# Patient Record
Sex: Male | Born: 1965 | Race: White | Hispanic: No | Marital: Married | Smoking: Never smoker
Health system: Southern US, Community
[De-identification: ages and names within clinical notes are randomized; demographics above are authoritative.]

## PROBLEM LIST (undated history)

## (undated) ENCOUNTER — Emergency Department (HOSPITAL_COMMUNITY): Admission: EM | Source: Home / Self Care

## (undated) DIAGNOSIS — G4730 Sleep apnea, unspecified: Secondary | ICD-10-CM

## (undated) DIAGNOSIS — E785 Hyperlipidemia, unspecified: Secondary | ICD-10-CM

## (undated) DIAGNOSIS — D126 Benign neoplasm of colon, unspecified: Secondary | ICD-10-CM

## (undated) DIAGNOSIS — E669 Obesity, unspecified: Secondary | ICD-10-CM

## (undated) DIAGNOSIS — M5136 Other intervertebral disc degeneration, lumbar region: Secondary | ICD-10-CM

## (undated) DIAGNOSIS — M5432 Sciatica, left side: Secondary | ICD-10-CM

## (undated) DIAGNOSIS — R7303 Prediabetes: Secondary | ICD-10-CM

## (undated) DIAGNOSIS — E119 Type 2 diabetes mellitus without complications: Secondary | ICD-10-CM

## (undated) DIAGNOSIS — Z8719 Personal history of other diseases of the digestive system: Secondary | ICD-10-CM

## (undated) DIAGNOSIS — Z87442 Personal history of urinary calculi: Secondary | ICD-10-CM

## (undated) DIAGNOSIS — M51369 Other intervertebral disc degeneration, lumbar region without mention of lumbar back pain or lower extremity pain: Secondary | ICD-10-CM

## (undated) DIAGNOSIS — K219 Gastro-esophageal reflux disease without esophagitis: Secondary | ICD-10-CM

## (undated) DIAGNOSIS — I10 Essential (primary) hypertension: Secondary | ICD-10-CM

## (undated) DIAGNOSIS — D649 Anemia, unspecified: Secondary | ICD-10-CM

## (undated) HISTORY — DX: Gastro-esophageal reflux disease without esophagitis: K21.9

## (undated) HISTORY — DX: Sleep apnea, unspecified: G47.30

## (undated) HISTORY — DX: Hyperlipidemia, unspecified: E78.5

## (undated) HISTORY — DX: Obesity, unspecified: E66.9

## (undated) HISTORY — DX: Sciatica, left side: M54.32

## (undated) HISTORY — PX: VASECTOMY: SHX75

## (undated) HISTORY — DX: Other intervertebral disc degeneration, lumbar region: M51.36

## (undated) HISTORY — PX: EYE SURGERY: SHX253

## (undated) HISTORY — PX: COLONOSCOPY: SHX174

## (undated) HISTORY — DX: Other intervertebral disc degeneration, lumbar region without mention of lumbar back pain or lower extremity pain: M51.369

## (undated) HISTORY — DX: Prediabetes: R73.03

## (undated) HISTORY — DX: Benign neoplasm of colon, unspecified: D12.6

## (undated) HISTORY — PX: TONSILLECTOMY: SUR1361

## (undated) HISTORY — PX: ESOPHAGOGASTRODUODENOSCOPY: SHX1529

---

## 2012-04-21 ENCOUNTER — Ambulatory Visit (INDEPENDENT_AMBULATORY_CARE_PROVIDER_SITE_OTHER): Admitting: Family Medicine

## 2012-04-21 ENCOUNTER — Encounter: Payer: Self-pay | Admitting: Family Medicine

## 2012-04-21 VITALS — BP 130/90 | HR 80 | Temp 98.2°F | Resp 16 | Ht 69.0 in | Wt 242.0 lb

## 2012-04-21 DIAGNOSIS — M5136 Other intervertebral disc degeneration, lumbar region: Secondary | ICD-10-CM | POA: Insufficient documentation

## 2012-04-21 DIAGNOSIS — K219 Gastro-esophageal reflux disease without esophagitis: Secondary | ICD-10-CM | POA: Insufficient documentation

## 2012-04-21 DIAGNOSIS — E785 Hyperlipidemia, unspecified: Secondary | ICD-10-CM | POA: Insufficient documentation

## 2012-04-21 DIAGNOSIS — I10 Essential (primary) hypertension: Secondary | ICD-10-CM

## 2012-04-21 DIAGNOSIS — M5432 Sciatica, left side: Secondary | ICD-10-CM | POA: Insufficient documentation

## 2012-04-21 DIAGNOSIS — R5381 Other malaise: Secondary | ICD-10-CM

## 2012-04-21 LAB — BASIC METABOLIC PANEL
BUN: 14 mg/dL (ref 6–23)
CO2: 24 mEq/L (ref 19–32)
Chloride: 105 mEq/L (ref 96–112)
Creat: 0.88 mg/dL (ref 0.50–1.35)
Glucose, Bld: 90 mg/dL (ref 70–99)

## 2012-04-21 LAB — CBC WITH DIFFERENTIAL/PLATELET
Basophils Relative: 1 % (ref 0–1)
Eosinophils Absolute: 0.4 10*3/uL (ref 0.0–0.7)
MCH: 29.4 pg (ref 26.0–34.0)
MCHC: 35 g/dL (ref 30.0–36.0)
Neutrophils Relative %: 65 % (ref 43–77)
Platelets: 317 10*3/uL (ref 150–400)

## 2012-04-21 LAB — LIPID PANEL
Cholesterol: 181 mg/dL (ref 0–200)
LDL Cholesterol: 116 mg/dL — ABNORMAL HIGH (ref 0–99)
Triglycerides: 137 mg/dL (ref <150)

## 2012-04-21 LAB — HEPATIC FUNCTION PANEL
AST: 17 U/L (ref 0–37)
Albumin: 4.4 g/dL (ref 3.5–5.2)
Total Bilirubin: 0.6 mg/dL (ref 0.3–1.2)
Total Protein: 7.1 g/dL (ref 6.0–8.3)

## 2012-04-21 LAB — TESTOSTERONE: Testosterone: 291 ng/dL — ABNORMAL LOW (ref 300–890)

## 2012-04-21 MED ORDER — PHENTERMINE HCL 37.5 MG PO CAPS: 37.5000 mg | ORAL_CAPSULE | ORAL | Status: DC

## 2012-04-21 NOTE — Progress Notes (Signed)
Subjective:    Patient ID: Zachary Gillespie, male    DOB: [birth year], 47 y.o.   MRN: [id number]  HPI  Patient is here to establish care.  He served in [employer] and has recently moved to this area.  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 He is concerned that due to his elevated BMI he could face sanctioned. He is interested in weight loss options. He is tried concerning in the past with success. He is currently exercising every day 30+ minutes. He is trying to adhere to a 1800-calorie a day diet. He is having no success.  He states he has a previous history of borderline blood pressure. He denies chest pain shortness breath or dyspnea on exertion.  He also reports daily fatigue. He denies erectile difficulties. He denies shortness of breath dyspnea on exertion or chest pain. He is overdue for lab studies.  He has a history of dyslipidemia. He brings in lab studies show an HDL of 32 and another HDL of 37.  He is trying Omega Red to treat that.  He is due to have his lipid panel rechecked.  Past Medical History  Diagnosis Date  . DDD (degenerative disc disease), lumbar   . GERD (gastroesophageal reflux disease)   . Sciatica of left side   . Dyslipidemia    No current outpatient prescriptions on file prior to visit.   No current facility-administered medications on file prior to visit.   .sall History   Social History  . Marital Status: Married    Spouse Name: N/A    Number of Children: N/A  . Years of Education: N/A   Occupational History  . Not on file.   Social History Main Topics  . Smoking status: Never Smoker   . Smokeless tobacco: Never Used  . Alcohol Use: No  . Drug Use: Not on file  . Sexually Active: Not on file   Other Topics Concern  . Not on file   Social History Narrative  . No narrative on file   Family History  Problem Relation Age of Onset  . Heart disease Father   . Diabetes Father   . Hyperlipidemia Father   . Diabetes Sister   . Heart  disease Paternal Grandfather     Review of Systems  All other systems reviewed and are negative.       Objective:   Physical Exam  Constitutional: He is oriented to person, place, and time. He appears well-developed and well-nourished.  HENT:  Head: Normocephalic.  Right Ear: External ear normal.  Left Ear: External ear normal.  Mouth/Throat: Oropharynx is clear and moist.  Eyes: Conjunctivae and EOM are normal. Pupils are equal, round, and reactive to light.  Neck: Normal range of motion. Neck supple. No JVD present. No thyromegaly present.  Cardiovascular: Normal rate, regular rhythm, normal heart sounds and intact distal pulses.  Exam reveals no gallop and no friction rub.   No murmur heard. Pulmonary/Chest: Effort normal and breath sounds normal. No respiratory distress. He has no wheezes. He has no rales. He exhibits no tenderness.  Abdominal: Soft. Bowel sounds are normal. He exhibits no distension and no mass. There is no tenderness. There is no rebound and no guarding.  Musculoskeletal: Normal range of motion. He exhibits tenderness.  Lymphadenopathy:    He has no cervical adenopathy.  Neurological: He is alert and oriented to person, place, and time. He has normal reflexes. He displays normal reflexes. No cranial nerve deficit. He exhibits normal  muscle tone. Coordination normal.  Skin: Skin is warm and dry. No rash noted. No erythema. No pallor.  Psychiatric: He has a normal mood and affect. His behavior is normal. Judgment and thought content normal.          Assessment & Plan:  1. Other malaise and fatigue - Basic Metabolic Panel - CBC with Differential - Hepatic Function Panel - Testosterone - TSH  2. Morbid obesity Recommended 30 minutes to an hour of aerobic exercise 5 days a week. Recommended an 1800 to calorie a day diet.  We'll begin Adipex 37.5 mg by mouth every morning 3 months. - TSH  3. Dyslipidemia We discussed increasing aerobic exercise and  weight loss as a way to address his low HDL. We'll check a fasting lipid panel. - Basic Metabolic Panel - Hepatic Function Panel - Lipid Panel  4. Essential hypertension, benign Advised the patient to check his blood pressures daily and consistently greater than 140 systolic or 90 diastolic would recommend therapy to treat his blood pressure carefully given his family history of heart disease.

## 2012-04-30 ENCOUNTER — Telehealth: Payer: Self-pay | Admitting: Family Medicine

## 2012-04-30 MED ORDER — LOSARTAN POTASSIUM 50 MG PO TABS: 50.0000 mg | ORAL_TABLET | Freq: Every day | ORAL | Status: DC

## 2012-04-30 NOTE — Telephone Encounter (Signed)
i'd start losartan 50 qd and recheck bp in 1 month. Script on my desk.

## 2012-04-30 NOTE — Telephone Encounter (Signed)
Rx Refilled  

## 2012-04-30 NOTE — Telephone Encounter (Signed)
Pt aware. We just need to send in Rx to [facility].

## 2012-04-30 NOTE — Telephone Encounter (Signed)
BP  Pulse 141/87  81 135/80  75 148/92  94 144/93  83 133/86  87 136/88  83 132/86  74 146/91  80 141/90  97  154/98  113 139/93  98 144/93  78 143/93  110 153/106 104 159/104 95  If you are going to put him on BP meds please write out Rx and he will pick up to send off to mail order pharmacy.

## 2012-05-21 ENCOUNTER — Telehealth: Payer: Self-pay | Admitting: Family Medicine

## 2012-05-21 MED ORDER — CELECOXIB 200 MG PO CAPS: 200.0000 mg | ORAL_CAPSULE | Freq: Every day | ORAL | Status: DC

## 2012-05-21 NOTE — Telephone Encounter (Signed)
Pt would like to try Celebrex and RX sent

## 2012-05-21 NOTE — Telephone Encounter (Signed)
Could try celebrex 200 mg poqday.

## 2012-06-10 ENCOUNTER — Emergency Department (HOSPITAL_COMMUNITY)
Admission: EM | Admit: 2012-06-10 | Discharge: 2012-06-10 | Disposition: A | Discharge status: Home / Self Care | Attending: Emergency Medicine | Admitting: Emergency Medicine

## 2012-06-10 ENCOUNTER — Encounter (HOSPITAL_COMMUNITY): Payer: Self-pay | Admitting: Emergency Medicine

## 2012-06-10 DIAGNOSIS — S239XXA Sprain of unspecified parts of thorax, initial encounter: Secondary | ICD-10-CM | POA: Insufficient documentation

## 2012-06-10 DIAGNOSIS — M5137 Other intervertebral disc degeneration, lumbosacral region: Secondary | ICD-10-CM | POA: Insufficient documentation

## 2012-06-10 DIAGNOSIS — X500XXA Overexertion from strenuous movement or load, initial encounter: Secondary | ICD-10-CM | POA: Insufficient documentation

## 2012-06-10 DIAGNOSIS — Z79899 Other long term (current) drug therapy: Secondary | ICD-10-CM | POA: Insufficient documentation

## 2012-06-10 DIAGNOSIS — Z8639 Personal history of other endocrine, nutritional and metabolic disease: Secondary | ICD-10-CM | POA: Insufficient documentation

## 2012-06-10 DIAGNOSIS — S29012A Strain of muscle and tendon of back wall of thorax, initial encounter: Secondary | ICD-10-CM

## 2012-06-10 DIAGNOSIS — S233XXA Sprain of ligaments of thoracic spine, initial encounter: Secondary | ICD-10-CM

## 2012-06-10 DIAGNOSIS — Y9389 Activity, other specified: Secondary | ICD-10-CM | POA: Insufficient documentation

## 2012-06-10 DIAGNOSIS — Z8739 Personal history of other diseases of the musculoskeletal system and connective tissue: Secondary | ICD-10-CM | POA: Insufficient documentation

## 2012-06-10 DIAGNOSIS — K219 Gastro-esophageal reflux disease without esophagitis: Secondary | ICD-10-CM | POA: Insufficient documentation

## 2012-06-10 DIAGNOSIS — Z880 Allergy status to penicillin: Secondary | ICD-10-CM | POA: Insufficient documentation

## 2012-06-10 DIAGNOSIS — I10 Essential (primary) hypertension: Secondary | ICD-10-CM | POA: Insufficient documentation

## 2012-06-10 DIAGNOSIS — Y929 Unspecified place or not applicable: Secondary | ICD-10-CM | POA: Insufficient documentation

## 2012-06-10 DIAGNOSIS — M51379 Other intervertebral disc degeneration, lumbosacral region without mention of lumbar back pain or lower extremity pain: Secondary | ICD-10-CM | POA: Insufficient documentation

## 2012-06-10 DIAGNOSIS — Z862 Personal history of diseases of the blood and blood-forming organs and certain disorders involving the immune mechanism: Secondary | ICD-10-CM | POA: Insufficient documentation

## 2012-06-10 HISTORY — DX: Essential (primary) hypertension: I10

## 2012-06-10 MED ORDER — CYCLOBENZAPRINE HCL 10 MG PO TABS: 10.0000 mg | ORAL_TABLET | Freq: Two times a day (BID) | ORAL | Status: DC | PRN

## 2012-06-10 MED ORDER — NAPROXEN 250 MG PO TABS: 500.0000 mg | ORAL_TABLET | Freq: Once | ORAL | Status: DC

## 2012-06-10 MED ORDER — CYCLOBENZAPRINE HCL 10 MG PO TABS
10.0000 mg | ORAL_TABLET | Freq: Once | ORAL | Status: AC
  Administered 2012-06-10: 10 mg via ORAL
  Filled 2012-06-10: qty 1

## 2012-06-10 NOTE — ED Notes (Signed)
Pt was lifting a large duffle bag onto a transport cart and twisted at the same time he was pulling.  Pt reports going down from the pain in his lower back.  Was unable to walk for several minutes but can now ambulate with 8/10 pain.  Denies pain in legs.  Pt alert oriented X4

## 2012-06-10 NOTE — ED Notes (Signed)
Pt c/o mid left back pain after lifting something this am; pt sts hx of back problems

## 2012-06-10 NOTE — ED Provider Notes (Signed)
History     CSN: [id number]  Arrival date & time 06/10/12  6295   First MD Initiated Contact with Patient 06/10/12 0913      Chief Complaint  Patient presents with  . Back Pain    (Consider location/radiation/quality/duration/timing/severity/associated sxs/prior treatment) HPI  Zachary Gillespie is a 47 y.o. male complaining of left thoracic back pain after lifting a heavy cane box and twisting his back earlier this a.m. Patient is in the [employer]. Patient states he has history of back problems. He denies numbness and paresthesia. He states the pain is exacerbated by movement. Is rated at 8/10 right now. It was 10 out of 10 at onset and initially couldn't move.   Past Medical History  Diagnosis Date  . DDD (degenerative disc disease), lumbar   . GERD (gastroesophageal reflux disease)   . Sciatica of left side   . Dyslipidemia   . Hypertension     Past Surgical History  Procedure Laterality Date  . Eye surgery    . Vasectomy    . Tonsillectomy      Family History  Problem Relation Age of Onset  . Heart disease Father   . Diabetes Father   . Hyperlipidemia Father   . Diabetes Sister   . Heart disease Paternal Grandfather     History  Substance Use Topics  . Smoking status: Never Smoker   . Smokeless tobacco: Never Used  . Alcohol Use: Yes     Comment: occ      Review of Systems  Constitutional: Negative for fever.  HENT: Negative for neck pain.   Gastrointestinal: Negative for nausea, vomiting and abdominal pain.  Genitourinary: Negative for dysuria and difficulty urinating.  Musculoskeletal: Positive for back pain.  Neurological: Negative for weakness and numbness.    Allergies  Penicillins; Keflex; and Sulfa antibiotics  Home Medications   Current Outpatient Rx  Name  Route  Sig  Dispense  Refill  . celecoxib (CELEBREX) 200 MG capsule   Oral   Take 1 capsule (200 mg total) by mouth daily.   90 capsule   3   . chlorpheniramine (CHLOR-TRIMETON)  4 MG tablet   Oral   Take 4 mg by mouth 2 (two) times daily as needed for allergies.         Marland Kitchen ibuprofen (ADVIL,MOTRIN) 200 MG tablet   Oral   Take 800 mg by mouth every 6 (six) hours as needed for pain.         Marland Kitchen losartan (COZAAR) 50 MG tablet   Oral   Take 1 tablet (50 mg total) by mouth daily.   90 tablet   3   . Melatonin 5 MG TABS   Oral   Take 1 tablet by mouth daily. For sleep         . Multiple Vitamins-Minerals (ONE-A-DAY MENS HEALTH FORMULA PO)   Oral   Take 1 tablet by mouth daily.         . OMEGA-3 KRILL OIL 300 MG CAPS   Oral   Take 1 capsule by mouth daily.         Marland Kitchen omeprazole (PRILOSEC) 20 MG capsule   Oral   Take 20 mg by mouth daily.         . phentermine 37.5 MG capsule   Oral   Take 1 capsule (37.5 mg total) by mouth every morning.   30 capsule   2     BP 119/85  Pulse 93  Temp(Src) 97.9 F (  36.6 C) (Oral)  Resp 18  SpO2 100%  Physical Exam  Nursing note and vitals reviewed. Constitutional: He is oriented to person, place, and time. He appears well-developed and well-nourished. No distress.  HENT:  Head: Normocephalic.  Eyes: Conjunctivae and EOM are normal.  Cardiovascular: Normal rate.   Pulmonary/Chest: Effort normal. No stridor.  Musculoskeletal: Normal range of motion.       Back:  Tenderness to palpation of left thoracic paraspinal musculature with spasm   Neurological: He is alert and oriented to person, place, and time.  Follows commands, Goal oriented speech, Strength is 5 out of 5x4 extremities, patient ambulates with a coordinated in nonantalgic gait. Sensation is grossly intact.    Psychiatric: He has a normal mood and affect.    ED Course  Procedures (including critical care time)  Labs Reviewed - No data to display No results found.   No diagnosis found.    MDM   Filed Vitals:   06/10/12 0902  BP: 119/85  Pulse: 93  Temp: 97.9 F (36.6 C)  TempSrc: Oral  Resp: 18  SpO2: 100%      Zachary Gillespie is a 47 y.o. male Patient with back pain.  No neurological deficits and normal neuro exam.  Patient can walk but states is painful.  No loss of bowel or bladder control.  No concern for cauda equina.  No fever, night sweats, weight loss, h/o cancer, IVDU.  RICE protocol and pain medicine indicated and discussed with patient.   Medications  cyclobenzaprine (FLEXERIL) tablet 10 mg (not administered)  naproxen (NAPROSYN) tablet 500 mg (not administered)    The patient is hemodynamically stable, appropriate for, and amenable to, discharge at this time. Pt verbalized understanding and agrees with care plan. Outpatient follow-up and return precautions given.    New Prescriptions   CYCLOBENZAPRINE (FLEXERIL) 10 MG TABLET    Take 1 tablet (10 mg total) by mouth 2 (two) times daily as needed for muscle spasms.           Wynetta Emery, PA-C 06/10/12 [phone]

## 2012-06-11 NOTE — ED Provider Notes (Signed)
Medical screening examination/treatment/procedure(s) were performed by non-physician practitioner and as supervising physician I was immediately available for consultation/collaboration.   Michaeljoseph Revolorio E Channing Yeager, MD 06/11/12 0720 

## 2012-08-03 ENCOUNTER — Telehealth: Payer: Self-pay | Admitting: Family Medicine

## 2012-08-03 ENCOUNTER — Encounter: Payer: Self-pay | Admitting: Family Medicine

## 2012-08-03 ENCOUNTER — Ambulatory Visit (INDEPENDENT_AMBULATORY_CARE_PROVIDER_SITE_OTHER): Admitting: Family Medicine

## 2012-08-03 VITALS — BP 130/70 | HR 88 | Temp 98.3°F | Resp 14 | Wt 231.0 lb

## 2012-08-03 DIAGNOSIS — M5137 Other intervertebral disc degeneration, lumbosacral region: Secondary | ICD-10-CM

## 2012-08-03 DIAGNOSIS — K219 Gastro-esophageal reflux disease without esophagitis: Secondary | ICD-10-CM

## 2012-08-03 DIAGNOSIS — I10 Essential (primary) hypertension: Secondary | ICD-10-CM

## 2012-08-03 DIAGNOSIS — E669 Obesity, unspecified: Secondary | ICD-10-CM

## 2012-08-03 DIAGNOSIS — G4700 Insomnia, unspecified: Secondary | ICD-10-CM

## 2012-08-03 DIAGNOSIS — M5136 Other intervertebral disc degeneration, lumbar region: Secondary | ICD-10-CM

## 2012-08-03 MED ORDER — ORLISTAT 120 MG PO CAPS: 120.0000 mg | ORAL_CAPSULE | Freq: Three times a day (TID) | ORAL | Status: DC

## 2012-08-03 MED ORDER — PANTOPRAZOLE SODIUM 40 MG PO TBEC: 40.0000 mg | DELAYED_RELEASE_TABLET | Freq: Every day | ORAL | Status: DC

## 2012-08-03 NOTE — Telephone Encounter (Signed)
That only leaves two other options, seizure drug topamax or new drug belviq (which will be expensive too).  What would he like to do?

## 2012-08-03 NOTE — Progress Notes (Signed)
Subjective:    Patient ID: Zachary Gillespie, male    DOB: 1965-05-30, 47 y.o.   MRN: [id number]  HPI Patient is here today for multiple medical concerns. #1 is hypertension. He is currently taking losartan 50 mg by mouth daily. His blood pressure is ranging 127-137/80-87.  He denies chest pain shortness of breath or dyspnea on exertion.  He continues to struggle with obesity.  He tried phenteramine 37.5 mg by mouth every morning with little benefit. He just stopped taking adipex.  He is interested in other options to help him lose weight. His main concern is that the weight is causing him low back discomfort.  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  He also complains of insomnia. This preceded the Adipex.  He does not watch TV or read in bed. He keeps a regular sleep-wake schedule. He exercises in the morning. He keeps the bedroom cool at night and uses white noise. He has no significant problems with his sleep hygiene.    He also has low back pain with left-sided sciatica. This has been getting increasingly worse. He is taking Celebrex 200 mg by mouth daily with minimal benefit. He also is using Flexeril as needed for muscle spasms.  He is also having breakthrough indigestion on omeprazole 20 mg by mouth daily, particularly since starting celebrex. Past Medical History  Diagnosis Date  . DDD (degenerative disc disease), lumbar   . GERD (gastroesophageal reflux disease)   . Sciatica of left side   . Dyslipidemia   . Obesity   . Hypertension    Past Surgical History  Procedure Laterality Date  . Eye surgery    . Vasectomy    . Tonsillectomy     Current Outpatient Prescriptions on File Prior to Visit  Medication Sig Dispense Refill  . celecoxib (CELEBREX) 200 MG capsule Take 1 capsule (200 mg total) by mouth daily.  90 capsule  3  . cyclobenzaprine (FLEXERIL) 10 MG tablet Take 1 tablet (10 mg total) by mouth 2 (two) times daily as  needed for muscle spasms.  20 tablet  0  . losartan (COZAAR) 50 MG tablet Take 1 tablet (50 mg total) by mouth daily.  90 tablet  3  . Melatonin 5 MG TABS Take 1 tablet by mouth daily. For sleep      . Multiple Vitamins-Minerals (ONE-A-DAY MENS HEALTH FORMULA PO) Take 1 tablet by mouth daily.      . OMEGA-3 KRILL OIL 300 MG CAPS Take 1 capsule by mouth daily.       No current facility-administered medications on file prior to visit.   Allergies  Allergen Reactions  . Penicillins Anaphylaxis  . Keflex (Cephalexin) Other (See Comments)    Childhood allergy  . Sulfa Antibiotics Other (See Comments)    Childhood allergy   History   Social History  . Marital Status: Married    Spouse Name: N/A    Number of Children: N/A  . Years of Education: N/A   Occupational History  . Not on file.   Social History Main Topics  . Smoking status: Never Smoker   . Smokeless tobacco: Never Used  . Alcohol Use: Yes     Comment: occ  . Drug Use: Not on file  . Sexually Active: Not on file   Other Topics Concern  . Not on file   Social History Narrative  . No narrative on file     Review  of Systems  All other systems reviewed and are negative.       Objective:   Physical Exam  Vitals reviewed. Constitutional: He appears well-developed and well-nourished.  Neck: Neck supple. No JVD present. No thyromegaly present.  Cardiovascular: Normal rate, regular rhythm, normal heart sounds and intact distal pulses.  Exam reveals no gallop and no friction rub.   No murmur heard. Pulmonary/Chest: Effort normal and breath sounds normal. No respiratory distress. He has no wheezes. He has no rales. He exhibits no tenderness.  Abdominal: Soft. Bowel sounds are normal. He exhibits no distension and no mass. There is no tenderness. There is no rebound and no guarding.  Musculoskeletal: He exhibits no edema.  Lymphadenopathy:    He has no cervical adenopathy.  Neurological: He is alert. No cranial  nerve deficit. He exhibits normal muscle tone. Coordination normal.  Skin: Skin is warm. No rash noted. No erythema. No pallor.          Assessment & Plan:  1. Hypertension Continue losartan 50 mg by mouth daily recheck in 6 months.  2. Obesity Start orlistat 120 mg by mouth 3 times a day with meals. Cautioned the patient about diarrhea  3. DDD (degenerative disc disease), lumbar Consult physical therapy. If symptoms worsen proceed with an MRI of the lumbar spine. - Ambulatory referral to Physical Therapy  4. GERD (gastroesophageal reflux disease) Discontinue omeprazole and try protonix 40 mg by mouth daily.  5. Insomnia Discussed proper sleep hygiene. Recommended over-the-counter melatonin. If this does not work to try trazodone .

## 2012-08-04 ENCOUNTER — Ambulatory Visit
Admission: RE | Admit: 2012-08-04 | Discharge: 2012-08-04 | Disposition: A | Discharge status: Home / Self Care | Source: Ambulatory Visit | Attending: Family Medicine | Admitting: Family Medicine

## 2012-08-04 ENCOUNTER — Ambulatory Visit (INDEPENDENT_AMBULATORY_CARE_PROVIDER_SITE_OTHER): Admitting: Family Medicine

## 2012-08-04 ENCOUNTER — Encounter: Payer: Self-pay | Admitting: Family Medicine

## 2012-08-04 VITALS — BP 110/64 | HR 98 | Temp 98.2°F | Resp 16 | Wt 231.0 lb

## 2012-08-04 DIAGNOSIS — R319 Hematuria, unspecified: Secondary | ICD-10-CM

## 2012-08-04 LAB — URINALYSIS, MICROSCOPIC ONLY
Bacteria, UA: NONE SEEN
Crystals: NONE SEEN

## 2012-08-04 LAB — URINALYSIS, ROUTINE W REFLEX MICROSCOPIC
Specific Gravity, Urine: 1.025 (ref 1.005–1.030)
Urobilinogen, UA: 0.2 mg/dL (ref 0.0–1.0)

## 2012-08-04 NOTE — Progress Notes (Signed)
Subjective:    Patient ID: Zachary Gillespie, male    DOB: 01-Feb-1965, 47 y.o.   MRN: [id number]  HPI  The acute onset of low back pain last night. The back pain is not radiating. However he experienced gross hematuria this morning. He denies dysuria, frequency, or urgency. He denies fevers or chills. He does have a history of kidney stone. Past Medical History  Diagnosis Date  . DDD (degenerative disc disease), lumbar   . GERD (gastroesophageal reflux disease)   . Sciatica of left side   . Dyslipidemia   . Obesity   . Hypertension    Past Surgical History  Procedure Laterality Date  . Eye surgery    . Vasectomy    . Tonsillectomy     Current Outpatient Prescriptions on File Prior to Visit  Medication Sig Dispense Refill  . celecoxib (CELEBREX) 200 MG capsule Take 1 capsule (200 mg total) by mouth daily.  90 capsule  3  . cyclobenzaprine (FLEXERIL) 10 MG tablet Take 1 tablet (10 mg total) by mouth 2 (two) times daily as needed for muscle spasms.  20 tablet  0  . losartan (COZAAR) 50 MG tablet Take 1 tablet (50 mg total) by mouth daily.  90 tablet  3  . Melatonin 5 MG TABS Take 1 tablet by mouth daily. For sleep      . Multiple Vitamins-Minerals (ONE-A-DAY MENS HEALTH FORMULA PO) Take 1 tablet by mouth daily.      . OMEGA-3 KRILL OIL 300 MG CAPS Take 1 capsule by mouth daily.      . pantoprazole (PROTONIX) 40 MG tablet Take 1 tablet (40 mg total) by mouth daily.  90 tablet  3   No current facility-administered medications on file prior to visit.   Allergies  Allergen Reactions  . Penicillins Anaphylaxis  . Keflex (Cephalexin) Other (See Comments)    Childhood allergy  . Sulfa Antibiotics Other (See Comments)    Childhood allergy   History   Social History  . Marital Status: Married    Spouse Name: N/A    Number of Children: N/A  . Years of Education: N/A   Occupational History  . Not on file.   Social History Main Topics  . Smoking status: Never Smoker   .  Smokeless tobacco: Never Used  . Alcohol Use: Yes     Comment: occ  . Drug Use: Not on file  . Sexually Active: Not on file   Other Topics Concern  . Not on file   Social History Narrative  . No narrative on file     Review of Systems  All other systems reviewed and are negative.       Objective:   Physical Exam  Vitals reviewed. Cardiovascular: Normal rate, regular rhythm and normal heart sounds.   No murmur heard. Pulmonary/Chest: Effort normal and breath sounds normal.  Abdominal: Soft. Bowel sounds are normal. He exhibits no distension. There is no tenderness. There is no rebound and no guarding.  Musculoskeletal: Normal range of motion. He exhibits no tenderness.   Office Visit on 08/04/2012  Component Date Value Range Status  . Color, Urine 08/04/2012 BROWN* YELLOW Final   Biochemicals may be affected by the color of the urine.  Marland Kitchen APPearance 08/04/2012 TURBID* CLEAR Final  . Specific Gravity, Urine 08/04/2012 1.025  1.005 - 1.030 Final  . pH 08/04/2012 7.0  5.0 - 8.0 Final  . Glucose, UA 08/04/2012 NEG  NEG mg/dL Final  . Bilirubin Urine 08/04/2012  SMALL* NEG Final  . Ketones, ur 08/04/2012 TRACE* NEG mg/dL Final  . Hgb urine dipstick 08/04/2012 LARGE* NEG Final  . Protein, ur 08/04/2012 100* NEG mg/dL Final  . Urobilinogen, UA 08/04/2012 0.2  0.0 - 1.0 mg/dL Final  . Nitrite 96/29/5284 NEG  NEG Final  . Leukocytes, UA 08/04/2012 NEG  NEG Final  . Squamous Epithelial / LPF 08/04/2012 NONE SEEN  RARE Final  . Crystals 08/04/2012 NONE SEEN  NONE SEEN Final  . Casts 08/04/2012 NONE SEEN  NONE SEEN Final  . WBC, UA 08/04/2012 NONE SEEN  <3 WBC/hpf Final  . RBC / HPF 08/04/2012 TNTC* <3 RBC/hpf Final  . Bacteria, UA 08/04/2012 NONE SEEN  RARE Final  . Daryll Drown 08/04/2012 TOO BLOODY TO SPIN   Final           Assessment & Plan:  1. Hematuria I suspect nephrolithiasis. Obtain CT scan without contrast of the abdomen and pelvis to confirm. Depending on the  size the kidney stone in, I would treat with analgesics and Flomax versus a urology consult.  Await results of the CT. - Urinalysis, Routine w reflex microscopic - CT Abdomen Pelvis Wo Contrast; Future

## 2012-08-05 NOTE — Telephone Encounter (Signed)
Discuss at [facility] 08/04/12

## 2012-08-06 ENCOUNTER — Ambulatory Visit: Admitting: Family Medicine

## 2012-08-11 ENCOUNTER — Ambulatory Visit: Admitting: Family Medicine

## 2012-08-13 ENCOUNTER — Ambulatory Visit (INDEPENDENT_AMBULATORY_CARE_PROVIDER_SITE_OTHER): Admitting: Family Medicine

## 2012-08-13 ENCOUNTER — Encounter: Payer: Self-pay | Admitting: Family Medicine

## 2012-08-13 VITALS — BP 110/64 | HR 78 | Temp 98.3°F | Resp 16 | Wt 231.0 lb

## 2012-08-13 DIAGNOSIS — N289 Disorder of kidney and ureter, unspecified: Secondary | ICD-10-CM

## 2012-08-13 DIAGNOSIS — N2889 Other specified disorders of kidney and ureter: Secondary | ICD-10-CM

## 2012-08-13 DIAGNOSIS — R319 Hematuria, unspecified: Secondary | ICD-10-CM

## 2012-08-13 LAB — URINALYSIS, ROUTINE W REFLEX MICROSCOPIC
Bilirubin Urine: NEGATIVE
Glucose, UA: NEGATIVE mg/dL
Hgb urine dipstick: NEGATIVE
Ketones, ur: NEGATIVE mg/dL
Leukocytes, UA: NEGATIVE
Nitrite: NEGATIVE
Protein, ur: NEGATIVE mg/dL
Specific Gravity, Urine: 1.025 (ref 1.005–1.030)
Urobilinogen, UA: 0.2 mg/dL (ref 0.0–1.0)
pH: 6.5 (ref 5.0–8.0)

## 2012-08-13 NOTE — Progress Notes (Signed)
Subjective:    Patient ID: Zachary Gillespie, male    DOB: 07/26/1965, 47 y.o.   MRN: [id number]  HPI  08/04/12 The acute onset of low back pain last night. The back pain is not radiating. However he experienced gross hematuria this morning. He denies dysuria, frequency, or urgency. He denies fevers or chills. He does have a history of kidney stone.  At that time, my plan was: 1. Hematuria I suspect nephrolithiasis. Obtain CT scan without contrast of the abdomen and pelvis to confirm. Depending on the size the kidney stone in, I would treat with analgesics and Flomax versus a urology consult.  Await results of the CT. - Urinalysis, Routine w reflex microscopic - CT Abdomen Pelvis Wo Contrast; Future  08/13/12 CT scan showed 2 nonobstructing renal calculi at the left UV junction. There were 2-3 mm in size. The patient feels that they have passed because he is asymptomatic. He has no further hematuria on his urinalysis today. The CT scan also showed a classical finding of renal calculi in his right kidney and a 2 cm ill-defined mass in his left kidney he is here today to discuss the results. Past Medical History  Diagnosis Date  . DDD (degenerative disc disease), lumbar   . GERD (gastroesophageal reflux disease)   . Sciatica of left side   . Dyslipidemia   . Obesity   . Hypertension    Past Surgical History  Procedure Laterality Date  . Eye surgery    . Vasectomy    . Tonsillectomy     Current Outpatient Prescriptions on File Prior to Visit  Medication Sig Dispense Refill  . celecoxib (CELEBREX) 200 MG capsule Take 1 capsule (200 mg total) by mouth daily.  90 capsule  3  . cyclobenzaprine (FLEXERIL) 10 MG tablet Take 1 tablet (10 mg total) by mouth 2 (two) times daily as needed for muscle spasms.  20 tablet  0  . losartan (COZAAR) 50 MG tablet Take 1 tablet (50 mg total) by mouth daily.  90 tablet  3  . Melatonin 5 MG TABS Take 1 tablet by mouth daily. For sleep      . Multiple  Vitamins-Minerals (ONE-A-DAY MENS HEALTH FORMULA PO) Take 1 tablet by mouth daily.      . OMEGA-3 KRILL OIL 300 MG CAPS Take 1 capsule by mouth daily.      . pantoprazole (PROTONIX) 40 MG tablet Take 1 tablet (40 mg total) by mouth daily.  90 tablet  3   No current facility-administered medications on file prior to visit.   Allergies  Allergen Reactions  . Penicillins Anaphylaxis  . Keflex (Cephalexin) Other (See Comments)    Childhood allergy  . Sulfa Antibiotics Other (See Comments)    Childhood allergy   History   Social History  . Marital Status: Married    Spouse Name: N/A    Number of Children: N/A  . Years of Education: N/A   Occupational History  . Not on file.   Social History Main Topics  . Smoking status: Never Smoker   . Smokeless tobacco: Never Used  . Alcohol Use: Yes     Comment: occ  . Drug Use: Not on file  . Sexually Active: Not on file   Other Topics Concern  . Not on file   Social History Narrative  . No narrative on file     Review of Systems  All other systems reviewed and are negative.       Objective:  Physical Exam  Vitals reviewed. Cardiovascular: Normal rate, regular rhythm and normal heart sounds.   No murmur heard. Pulmonary/Chest: Effort normal and breath sounds normal.  Abdominal: Soft. Bowel sounds are normal. He exhibits no distension. There is no tenderness. There is no rebound and no guarding.  Musculoskeletal: Normal range of motion. He exhibits no tenderness.   Office Visit on 08/13/2012  Component Date Value Range Status  . Color, Urine 08/13/2012 YELLOW  YELLOW Final  . APPearance 08/13/2012 CLEAR  CLEAR Final  . Specific Gravity, Urine 08/13/2012 1.025  1.005 - 1.030 Final  . pH 08/13/2012 6.5  5.0 - 8.0 Final  . Glucose, UA 08/13/2012 NEG  NEG mg/dL Final  . Bilirubin Urine 08/13/2012 NEG  NEG Final  . Ketones, ur 08/13/2012 NEG  NEG mg/dL Final  . Hgb urine dipstick 08/13/2012 NEG  NEG Final  . Protein, ur  08/13/2012 NEG  NEG mg/dL Final  . Urobilinogen, UA 08/13/2012 0.2  0.0 - 1.0 mg/dL Final  . Nitrite 96/04/5407 NEG  NEG Final  . Leukocytes, UA 08/13/2012 NEG  NEG Final           Assessment & Plan:  1. Hematuria Hematuria is related to the kidney stones. This has resolved. He can discontinue pain pills and Flomax. Follow up as needed. I will schedule an ultrasound of the kidney to evaluate the mass to ensure that it is a simple cyst and not a pathologic mass. This was discussed in detail with the patient. He declines further workup for kidney stones including a 24-hour urine study. - Urinalysis, Routine w reflex microscopic

## 2012-08-14 ENCOUNTER — Emergency Department (HOSPITAL_COMMUNITY)

## 2012-08-14 ENCOUNTER — Emergency Department (HOSPITAL_COMMUNITY)
Admission: EM | Admit: 2012-08-14 | Discharge: 2012-08-14 | Disposition: A | Discharge status: Home / Self Care | Attending: Emergency Medicine | Admitting: Emergency Medicine

## 2012-08-14 ENCOUNTER — Encounter (HOSPITAL_COMMUNITY): Payer: Self-pay

## 2012-08-14 DIAGNOSIS — R112 Nausea with vomiting, unspecified: Secondary | ICD-10-CM | POA: Insufficient documentation

## 2012-08-14 DIAGNOSIS — Z8739 Personal history of other diseases of the musculoskeletal system and connective tissue: Secondary | ICD-10-CM | POA: Insufficient documentation

## 2012-08-14 DIAGNOSIS — K219 Gastro-esophageal reflux disease without esophagitis: Secondary | ICD-10-CM | POA: Insufficient documentation

## 2012-08-14 DIAGNOSIS — Z8639 Personal history of other endocrine, nutritional and metabolic disease: Secondary | ICD-10-CM | POA: Insufficient documentation

## 2012-08-14 DIAGNOSIS — M5137 Other intervertebral disc degeneration, lumbosacral region: Secondary | ICD-10-CM | POA: Insufficient documentation

## 2012-08-14 DIAGNOSIS — N200 Calculus of kidney: Secondary | ICD-10-CM

## 2012-08-14 DIAGNOSIS — Z880 Allergy status to penicillin: Secondary | ICD-10-CM | POA: Insufficient documentation

## 2012-08-14 DIAGNOSIS — R300 Dysuria: Secondary | ICD-10-CM | POA: Insufficient documentation

## 2012-08-14 DIAGNOSIS — Z791 Long term (current) use of non-steroidal anti-inflammatories (NSAID): Secondary | ICD-10-CM | POA: Insufficient documentation

## 2012-08-14 DIAGNOSIS — M51379 Other intervertebral disc degeneration, lumbosacral region without mention of lumbar back pain or lower extremity pain: Secondary | ICD-10-CM | POA: Insufficient documentation

## 2012-08-14 DIAGNOSIS — E669 Obesity, unspecified: Secondary | ICD-10-CM | POA: Insufficient documentation

## 2012-08-14 DIAGNOSIS — Z79899 Other long term (current) drug therapy: Secondary | ICD-10-CM | POA: Insufficient documentation

## 2012-08-14 DIAGNOSIS — Z862 Personal history of diseases of the blood and blood-forming organs and certain disorders involving the immune mechanism: Secondary | ICD-10-CM | POA: Insufficient documentation

## 2012-08-14 DIAGNOSIS — I10 Essential (primary) hypertension: Secondary | ICD-10-CM | POA: Insufficient documentation

## 2012-08-14 LAB — URINALYSIS, ROUTINE W REFLEX MICROSCOPIC
Bilirubin Urine: NEGATIVE
Glucose, UA: NEGATIVE mg/dL
Hgb urine dipstick: NEGATIVE
Ketones, ur: NEGATIVE mg/dL
Protein, ur: NEGATIVE mg/dL

## 2012-08-14 MED ORDER — ONDANSETRON HCL 4 MG/2ML IJ SOLN
4.0000 mg | Freq: Once | INTRAMUSCULAR | Status: AC
  Administered 2012-08-14: 4 mg via INTRAVENOUS
  Filled 2012-08-14: qty 2

## 2012-08-14 MED ORDER — SODIUM CHLORIDE 0.9 % IV BOLUS (SEPSIS)
1000.0000 mL | Freq: Once | INTRAVENOUS | Status: AC
  Administered 2012-08-14: 1000 mL via INTRAVENOUS

## 2012-08-14 MED ORDER — HYDROMORPHONE HCL PF 1 MG/ML IJ SOLN
1.0000 mg | Freq: Once | INTRAMUSCULAR | Status: AC
  Administered 2012-08-14: 1 mg via INTRAVENOUS
  Filled 2012-08-14: qty 1

## 2012-08-14 MED ORDER — OXYCODONE-ACETAMINOPHEN 5-325 MG PO TABS: 2.0000 | ORAL_TABLET | ORAL | Status: DC | PRN

## 2012-08-14 MED ORDER — ONDANSETRON 4 MG PO TBDP: 4.0000 mg | ORAL_TABLET | Freq: Three times a day (TID) | ORAL | Status: DC | PRN

## 2012-08-14 MED ORDER — MORPHINE SULFATE 4 MG/ML IJ SOLN
4.0000 mg | Freq: Once | INTRAMUSCULAR | Status: AC
  Administered 2012-08-14: 4 mg via INTRAVENOUS
  Filled 2012-08-14: qty 1

## 2012-08-14 NOTE — ED Notes (Signed)
Pt returned to D33 from Ultrasound on stretcher with tech. Pt requesting additional pain medication upon arrival to room.

## 2012-08-14 NOTE — ED Notes (Signed)
Resident at the bedside

## 2012-08-14 NOTE — ED Provider Notes (Signed)
CSN: [id number]     Arrival date & time 08/14/12  4098 History     First MD Initiated Contact with Patient 08/14/12 [phone]     Chief Complaint  Patient presents with  . Flank Pain   (Consider location/radiation/quality/duration/timing/severity/associated sxs/prior Treatment) Patient is a 47 y.o. male presenting with flank pain. The history is provided by the patient.  Flank Pain This is a new problem. The current episode started today. The problem occurs constantly. The problem has been unchanged. Associated symptoms include abdominal pain, nausea, urinary symptoms and vomiting. Pertinent negatives include no arthralgias, change in bowel habit, chest pain, chills, coughing, diaphoresis, fatigue, fever, headaches, myalgias, numbness or weakness. Nothing aggravates the symptoms. He has tried oral narcotics for the symptoms. The treatment provided no relief.    Past Medical History  Diagnosis Date  . DDD (degenerative disc disease), lumbar   . GERD (gastroesophageal reflux disease)   . Sciatica of left side   . Dyslipidemia   . Obesity   . Hypertension   . Kidney stone    Past Surgical History  Procedure Laterality Date  . Eye surgery    . Vasectomy    . Tonsillectomy     Family History  Problem Relation Age of Onset  . Heart disease Father   . Diabetes Father   . Hyperlipidemia Father   . Diabetes Sister   . Heart disease Paternal Grandfather    History  Substance Use Topics  . Smoking status: Never Smoker   . Smokeless tobacco: Never Used  . Alcohol Use: Yes     Comment: occ    Review of Systems  Constitutional: Negative for fever, chills, diaphoresis, appetite change and fatigue.  Respiratory: Negative for cough, chest tightness, shortness of breath and wheezing.   Cardiovascular: Negative for chest pain, palpitations and leg swelling.  Gastrointestinal: Positive for nausea, vomiting and abdominal pain. Negative for diarrhea, blood in stool, abdominal distention and  change in bowel habit.  Genitourinary: Positive for flank pain, decreased urine volume and difficulty urinating. Negative for dysuria, urgency, frequency and hematuria.  Musculoskeletal: Negative for myalgias and arthralgias.  Neurological: Negative for dizziness, syncope, weakness, light-headedness, numbness and headaches.  All other systems reviewed and are negative.    Allergies  Penicillins; Keflex; and Sulfa antibiotics  Home Medications   Current Outpatient Rx  Name  Route  Sig  Dispense  Refill  . celecoxib (CELEBREX) 200 MG capsule   Oral   Take 1 capsule (200 mg total) by mouth daily.   90 capsule   3   . cyclobenzaprine (FLEXERIL) 10 MG tablet   Oral   Take 1 tablet (10 mg total) by mouth 2 (two) times daily as needed for muscle spasms.   20 tablet   0   . HYDROcodone-acetaminophen (NORCO/VICODIN) 5-325 MG per tablet   Oral   Take 1 tablet by mouth every 4 (four) hours as needed for pain.         Marland Kitchen losartan (COZAAR) 50 MG tablet   Oral   Take 1 tablet (50 mg total) by mouth daily.   90 tablet   3   . Melatonin 5 MG TABS   Oral   Take 1 tablet by mouth daily. For sleep         . Multiple Vitamins-Minerals (ONE-A-DAY MENS HEALTH FORMULA PO)   Oral   Take 1 tablet by mouth daily.         . OMEGA-3 KRILL OIL 300 MG CAPS  Oral   Take 1 capsule by mouth daily.         Marland Kitchen omeprazole (PRILOSEC) 20 MG capsule   Oral   Take 20 mg by mouth daily.         . tamsulosin (FLOMAX) 0.4 MG CAPS capsule   Oral   Take 0.4 mg by mouth daily.         . ondansetron (ZOFRAN ODT) 4 MG disintegrating tablet   Oral   Take 1 tablet (4 mg total) by mouth every 8 (eight) hours as needed for nausea.   20 tablet   0   . oxyCODONE-acetaminophen (ROXICET) 5-325 MG per tablet   Oral   Take 2 tablets by mouth every 4 (four) hours as needed for pain.   30 tablet   0   . pantoprazole (PROTONIX) 40 MG tablet   Oral   Take 1 tablet (40 mg total) by mouth daily.    90 tablet   3    BP 140/90  Pulse 95  Temp(Src) 97.5 F (36.4 C) (Oral)  Resp 22  SpO2 100% Physical Exam  Nursing note and vitals reviewed. Constitutional: He is oriented to person, place, and time. He appears well-developed and well-nourished. No distress.  HENT:  Head: Normocephalic and atraumatic.  Eyes: EOM are normal. Pupils are equal, round, and reactive to light.  Neck: Normal range of motion. Neck supple.  Cardiovascular: Normal rate, regular rhythm, normal heart sounds and intact distal pulses.   Pulmonary/Chest: Effort normal and breath sounds normal. No respiratory distress. He has no wheezes. He has no rales.  Abdominal: Soft. Normal appearance and bowel sounds are normal. He exhibits no distension. There is tenderness in the suprapubic area and left lower quadrant. There is CVA tenderness (left-sided). There is no rigidity, no rebound and no guarding.  Musculoskeletal: Normal range of motion. He exhibits no edema and no tenderness.  Neurological: He is alert and oriented to person, place, and time. He has normal strength. No cranial nerve deficit or sensory deficit. He exhibits normal muscle tone. Coordination normal. GCS eye subscore is 4. GCS verbal subscore is 5. GCS motor subscore is 6.  Skin: Skin is warm and dry. No rash noted. He is not diaphoretic.    ED Course   Procedures (including critical care time)  Labs Reviewed  URINALYSIS, ROUTINE W REFLEX MICROSCOPIC   US Renal  08/14/2012   *RADIOLOGY REPORT*  Clinical Data:  Left flank pain.  Kidney stone.  Hypertension.  RENAL/URINARY TRACT ULTRASOUND COMPLETE  Comparison:  08/04/2012.  Findings:  Right Kidney:  12.1 cm. Normal echotexture.  Normal central sinus echo complex.  No hydronephrosis.  Calculi better demonstrated on prior CT.  Left Kidney:  Moderate left hydronephrosis with mild dilation of the pelvis and dilation extending into the calyces.  The left kidney measures the left kidney measures 11.7 cm long  axis. Interpolar cyst noted measuring 26 mm.  Vascular flow appears normal.  Bladder:  No left ureteral jet is visualized.  IMPRESSION:  1.  New moderate left hydronephrosis, likely associated with previously demonstrated distal left ureteral calculi. 2.  Normal appearance of the right kidney with previously seen and collecting system calculi are poorly visualized. 3.  Absence of the left ureteral jet compatible with distal left ureteral obstruction as the cause of hydronephrosis, when taken in conjunction with prior CT.   Original Report Authenticated By: Andreas Newport, M.D.   1. Nephrolithiasis     MDM  47 y.o. M  with a known h/o nephrolithiasis presenting with L flank pain.  Pt had CT abdomen 1 week ago due to development of hematuria- stone study showed 2, 2-3 mm nonobstructing kidney stones in left distal ureter. He had no pain at that time.  Pt was started on flomax and states he has been urinating very frequently with no pain.  However last night he developed L flank pain with radiation to the LLQ, suprapubic area, and testicles.  Pt has had decreased urination today, but no hematuria.  +associated nausea and vomiting.  No fevers.  Pain similar to prior episodes of kidney stones.    On exam, pt AFVSS.  +L CVAT with LLQ and suprapubic TTP.  No distention, rebound, or guarding.  Given pt's history and recent CT scan confirming kidney stones, this is likely cause of symptoms.  CT scan showed very small caliber, non-obstructing stones.  Pt likely passing stones now as he has been taking flomax for 1 week.  CT shows 2 cm low-attenuation mass on L kidney most consistent with cyst.  Pt saw his PCP yesterday who has scheduled a renal U/S as outpatient, however this has not yet been scheduled.  Will check renal U/S, U/A to r/o infection.  Symptomatic treatment with pain meds, zofran, and fluids.  Renal U/S shows moderate hydronephrosis, otherwise no acute findings.  U/A with no evidence of infection.  Pt  stable for discharge with close PCP f/u.  ED return precautions given.  Pain and nausea meds prescribed.  No further questions or concerns.  Discussed with attending Dr. Blinda Leatherwood.  Jodean Lima, MD 08/14/12 [phone]

## 2012-08-14 NOTE — ED Notes (Signed)
Patient transported to Ultrasound on stretcher with tech. 

## 2012-08-14 NOTE — ED Notes (Signed)
Dr. Blinda Leatherwood at the bedside to speak with pt.

## 2012-08-14 NOTE — ED Notes (Signed)
Pt c/o left flank pain radiating around to abd and into groin. Hx of kidney stones years ago. Nausea and vomiting with the pain. When urinating only able to dribble.

## 2012-08-25 NOTE — ED Provider Notes (Signed)
I saw and evaluated the patient, reviewed the resident's note and I agree with the findings and plan.  Seen for flank pain with recent history of renal pelvis stone. Ultrasound does confirm hydronephrosis, causing the patient's pain. Patient treated with analgesia, followup with urology.  Gilda Crease, MD 08/25/12 [phone]

## 2012-09-01 ENCOUNTER — Ambulatory Visit: Attending: Family Medicine | Admitting: Rehabilitation

## 2012-09-01 DIAGNOSIS — M2560 Stiffness of unspecified joint, not elsewhere classified: Secondary | ICD-10-CM | POA: Insufficient documentation

## 2012-09-01 DIAGNOSIS — M545 Low back pain, unspecified: Secondary | ICD-10-CM | POA: Insufficient documentation

## 2012-09-01 DIAGNOSIS — IMO0001 Reserved for inherently not codable concepts without codable children: Secondary | ICD-10-CM | POA: Insufficient documentation

## 2012-09-01 DIAGNOSIS — R293 Abnormal posture: Secondary | ICD-10-CM | POA: Insufficient documentation

## 2012-09-03 ENCOUNTER — Ambulatory Visit: Admitting: Rehabilitation

## 2012-09-10 ENCOUNTER — Encounter: Admitting: Physical Therapy

## 2012-09-15 ENCOUNTER — Ambulatory Visit: Attending: Rehabilitation | Admitting: Physical Therapy

## 2012-09-15 DIAGNOSIS — R293 Abnormal posture: Secondary | ICD-10-CM | POA: Insufficient documentation

## 2012-09-15 DIAGNOSIS — M2560 Stiffness of unspecified joint, not elsewhere classified: Secondary | ICD-10-CM | POA: Insufficient documentation

## 2012-09-15 DIAGNOSIS — IMO0001 Reserved for inherently not codable concepts without codable children: Secondary | ICD-10-CM | POA: Insufficient documentation

## 2012-09-15 DIAGNOSIS — M545 Low back pain, unspecified: Secondary | ICD-10-CM | POA: Insufficient documentation

## 2012-09-17 ENCOUNTER — Ambulatory Visit: Admitting: Physical Therapy

## 2012-09-22 ENCOUNTER — Ambulatory Visit: Admitting: Physical Therapy

## 2012-09-24 ENCOUNTER — Ambulatory Visit: Admitting: Physical Therapy

## 2012-09-29 ENCOUNTER — Ambulatory Visit: Admitting: Physical Therapy

## 2012-10-01 ENCOUNTER — Ambulatory Visit: Admitting: Physical Therapy

## 2012-10-01 ENCOUNTER — Ambulatory Visit (INDEPENDENT_AMBULATORY_CARE_PROVIDER_SITE_OTHER): Admitting: Family Medicine

## 2012-10-01 ENCOUNTER — Encounter: Payer: Self-pay | Admitting: Family Medicine

## 2012-10-01 VITALS — BP 118/72 | HR 78 | Temp 98.5°F | Resp 18 | Ht 69.0 in | Wt 245.0 lb

## 2012-10-01 DIAGNOSIS — J019 Acute sinusitis, unspecified: Secondary | ICD-10-CM

## 2012-10-01 DIAGNOSIS — B9689 Other specified bacterial agents as the cause of diseases classified elsewhere: Secondary | ICD-10-CM

## 2012-10-01 MED ORDER — AZITHROMYCIN 250 MG PO TABS: ORAL_TABLET | ORAL | Status: DC

## 2012-10-01 NOTE — Progress Notes (Signed)
Subjective:    Patient ID: Zachary Gillespie, male    DOB: 1965/06/10, 47 y.o.   MRN: [id number]  HPI Patient reports 4 days of nasal congestion, sinus pressure, postnasal drip, coughing, in both the ears feel stopped up. He denies any sinus pain. He denies any headaches. He denies any fevers or chills or shortness of breath. Past Medical History  Diagnosis Date  . DDD (degenerative disc disease), lumbar   . GERD (gastroesophageal reflux disease)   . Sciatica of left side   . Dyslipidemia   . Obesity   . Hypertension   . Kidney stone    Current Outpatient Prescriptions on File Prior to Visit  Medication Sig Dispense Refill  . celecoxib (CELEBREX) 200 MG capsule Take 1 capsule (200 mg total) by mouth daily.  90 capsule  3  . HYDROcodone-acetaminophen (NORCO/VICODIN) 5-325 MG per tablet Take 1 tablet by mouth every 4 (four) hours as needed for pain. Prn kidney stone      . losartan (COZAAR) 50 MG tablet Take 1 tablet (50 mg total) by mouth daily.  90 tablet  3  . Melatonin 5 MG TABS Take 1 tablet by mouth daily. For sleep      . Multiple Vitamins-Minerals (ONE-A-DAY MENS HEALTH FORMULA PO) Take 1 tablet by mouth daily.      . OMEGA-3 KRILL OIL 300 MG CAPS Take 1 capsule by mouth daily.      Marland Kitchen omeprazole (PRILOSEC) 20 MG capsule Take 20 mg by mouth daily.      . tamsulosin (FLOMAX) 0.4 MG CAPS capsule Take 0.4 mg by mouth daily as needed.        No current facility-administered medications on file prior to visit.   Allergies  Allergen Reactions  . Penicillins Anaphylaxis  . Keflex [Cephalexin] Other (See Comments)    Childhood allergy  . Sulfa Antibiotics Other (See Comments)    Childhood allergy   History   Social History  . Marital Status: Married    Spouse Name: N/A    Number of Children: N/A  . Years of Education: N/A   Occupational History  . Not on file.   Social History Main Topics  . Smoking status: Never Smoker   . Smokeless tobacco: Never Used  . Alcohol Use:  Yes     Comment: occ  . Drug Use: Not on file  . Sexual Activity: Not on file   Other Topics Concern  . Not on file   Social History Narrative  . No narrative on file      Review of Systems  All other systems reviewed and are negative.       Objective:   Physical Exam  Constitutional: He appears well-developed and well-nourished. No distress.  HENT:  Right Ear: External ear normal.  Left Ear: External ear normal.  Nose: Mucosal edema and rhinorrhea present.  Mouth/Throat: Oropharynx is clear and moist. No oropharyngeal exudate.  Eyes: Pupils are equal, round, and reactive to light. No scleral icterus.  Neck: Neck supple.  Cardiovascular: Normal rate, regular rhythm and normal heart sounds.  Exam reveals no gallop and no friction rub.   No murmur heard. Pulmonary/Chest: Effort normal and breath sounds normal. No respiratory distress. He has no wheezes. He has no rales.  Lymphadenopathy:    He has no cervical adenopathy.  Skin: He is not diaphoretic.          Assessment & Plan:  1. Acute rhinosinusitis I believe this is likely a viral sinus  infection not bacterial. Therefore I recommended tincture of time for one week. I recommended Sudafed 60 mg every 6 hours as needed for congestion. I recommended Mucinex 400  milligrams every 4 hours as needed for congestion and coughing. If the symptoms persist more than a week or if they worsen he can get a Z-Pak which I prescribed for him. That cautioned him not to fill the medicine immediately and to allow for infection to improve on the time as I anticipate - azithromycin (ZITHROMAX) 250 MG tablet; 2 tabs poqday 1, 1 tab poqday 2-5  Dispense: 6 tablet; Refill: 0

## 2012-10-06 ENCOUNTER — Ambulatory Visit: Admitting: Physical Therapy

## 2012-10-08 ENCOUNTER — Ambulatory Visit: Attending: Rehabilitation | Admitting: Physical Therapy

## 2012-10-08 DIAGNOSIS — R293 Abnormal posture: Secondary | ICD-10-CM | POA: Insufficient documentation

## 2012-10-08 DIAGNOSIS — M545 Low back pain, unspecified: Secondary | ICD-10-CM | POA: Insufficient documentation

## 2012-10-08 DIAGNOSIS — IMO0001 Reserved for inherently not codable concepts without codable children: Secondary | ICD-10-CM | POA: Insufficient documentation

## 2012-10-08 DIAGNOSIS — M2560 Stiffness of unspecified joint, not elsewhere classified: Secondary | ICD-10-CM | POA: Insufficient documentation

## 2012-10-13 ENCOUNTER — Ambulatory Visit: Admitting: Physical Therapy

## 2012-10-20 ENCOUNTER — Ambulatory Visit: Admitting: Physical Therapy

## 2012-10-22 ENCOUNTER — Ambulatory Visit: Admitting: Physical Therapy

## 2012-10-27 ENCOUNTER — Ambulatory Visit: Admitting: Physical Therapy

## 2012-10-29 ENCOUNTER — Ambulatory Visit: Admitting: Physical Therapy

## 2012-11-03 ENCOUNTER — Ambulatory Visit: Admitting: Physical Therapy

## 2012-11-05 ENCOUNTER — Ambulatory Visit: Admitting: Physical Therapy

## 2012-11-10 ENCOUNTER — Ambulatory Visit: Attending: Rehabilitation | Admitting: Physical Therapy

## 2012-11-10 DIAGNOSIS — M545 Low back pain, unspecified: Secondary | ICD-10-CM | POA: Insufficient documentation

## 2012-11-10 DIAGNOSIS — M2560 Stiffness of unspecified joint, not elsewhere classified: Secondary | ICD-10-CM | POA: Insufficient documentation

## 2012-11-10 DIAGNOSIS — IMO0001 Reserved for inherently not codable concepts without codable children: Secondary | ICD-10-CM | POA: Insufficient documentation

## 2012-11-10 DIAGNOSIS — R293 Abnormal posture: Secondary | ICD-10-CM | POA: Insufficient documentation

## 2012-11-12 ENCOUNTER — Other Ambulatory Visit: Payer: Self-pay

## 2012-11-12 ENCOUNTER — Ambulatory Visit: Admitting: Physical Therapy

## 2012-12-14 ENCOUNTER — Encounter: Payer: Self-pay | Admitting: Family Medicine

## 2012-12-14 ENCOUNTER — Other Ambulatory Visit: Payer: Self-pay | Admitting: Family Medicine

## 2012-12-14 MED ORDER — CIPROFLOXACIN HCL 500 MG PO TABS: 500.0000 mg | ORAL_TABLET | Freq: Two times a day (BID) | ORAL | Status: DC

## 2012-12-14 MED ORDER — OMEPRAZOLE 40 MG PO CPDR: 40.0000 mg | DELAYED_RELEASE_CAPSULE | Freq: Every day | ORAL | Status: DC

## 2012-12-21 ENCOUNTER — Ambulatory Visit: Admitting: Family Medicine

## 2013-03-15 ENCOUNTER — Ambulatory Visit (INDEPENDENT_AMBULATORY_CARE_PROVIDER_SITE_OTHER): Admitting: Family Medicine

## 2013-03-15 ENCOUNTER — Encounter: Payer: Self-pay | Admitting: Family Medicine

## 2013-03-15 VITALS — BP 126/82 | HR 78 | Temp 97.9°F | Resp 16 | Ht 69.0 in | Wt 255.0 lb

## 2013-03-15 DIAGNOSIS — M79605 Pain in left leg: Secondary | ICD-10-CM

## 2013-03-15 DIAGNOSIS — E291 Testicular hypofunction: Secondary | ICD-10-CM

## 2013-03-15 DIAGNOSIS — M545 Low back pain, unspecified: Secondary | ICD-10-CM

## 2013-03-15 DIAGNOSIS — E349 Endocrine disorder, unspecified: Secondary | ICD-10-CM

## 2013-03-15 DIAGNOSIS — R5381 Other malaise: Secondary | ICD-10-CM

## 2013-03-15 DIAGNOSIS — R5383 Other fatigue: Secondary | ICD-10-CM

## 2013-03-15 DIAGNOSIS — M79604 Pain in right leg: Secondary | ICD-10-CM

## 2013-03-15 NOTE — Progress Notes (Signed)
Subjective:    Patient ID: Zachary Gillespie, male    DOB: 1965/11/14, 48 y.o.   MRN: [id number]  HPI  Patient complains of burning and stinging pain radiating to his central low back down both legs. He also complains of some numbness and tingling in his saddle area. He denies any bowel or bladder incontinence. He denies any weakness in the legs. Symptoms have gotten progressively worse. He originally had sciatica on the left side but now it has progressed to both sides. He denies any injury to her back. Has been gradual in onset. Symptoms have been greater than 6 weeks in duration. He also reports obesity. He snores. He is extremely tired in the mornings. His wife cannot sleep in the same bed and due to his snoring. He denies any chest pain or shortness of breath however his biggest concern is fatigue. I saw the patient in the symptoms in the past and found to have low testosterone. His CBC was normal his TSH was normal. He is interested in treating his testosterone deficiency Past Medical History  Diagnosis Date  . DDD (degenerative disc disease), lumbar   . GERD (gastroesophageal reflux disease)   . Sciatica of left side   . Dyslipidemia   . Obesity   . Hypertension   . Kidney stone    Current Outpatient Prescriptions on File Prior to Visit  Medication Sig Dispense Refill  . celecoxib (CELEBREX) 200 MG capsule Take 1 capsule (200 mg total) by mouth daily.  90 capsule  3  . losartan (COZAAR) 50 MG tablet Take 1 tablet (50 mg total) by mouth daily.  90 tablet  3  . Melatonin 5 MG TABS Take 1 tablet by mouth daily. For sleep      . Multiple Vitamins-Minerals (ONE-A-DAY MENS HEALTH FORMULA PO) Take 1 tablet by mouth daily.      . OMEGA-3 KRILL OIL 300 MG CAPS Take 1 capsule by mouth daily.      Marland Kitchen omeprazole (PRILOSEC) 40 MG capsule Take 1 capsule (40 mg total) by mouth daily.  90 capsule  3   No current facility-administered medications on file prior to visit.   Allergies  Allergen  Reactions  . Penicillins Anaphylaxis  . Keflex [Cephalexin] Other (See Comments)    Childhood allergy  . Sulfa Antibiotics Other (See Comments)    Childhood allergy   History   Social History  . Marital Status: Married    Spouse Name: N/A    Number of Children: N/A  . Years of Education: N/A   Occupational History  . Not on file.   Social History Main Topics  . Smoking status: Never Smoker   . Smokeless tobacco: Never Used  . Alcohol Use: Yes     Comment: occ  . Drug Use: Not on file  . Sexual Activity: Not on file   Other Topics Concern  . Not on file   Social History Narrative  . No narrative on file     Review of Systems  All other systems reviewed and are negative.       Objective:   Physical Exam  Vitals reviewed. Constitutional: He is oriented to person, place, and time. He appears well-developed and well-nourished.  Cardiovascular: Normal rate, regular rhythm and normal heart sounds.   Pulmonary/Chest: Effort normal and breath sounds normal. No respiratory distress. He has no wheezes.  Neurological: He is alert and oriented to person, place, and time. He has normal reflexes. He displays normal reflexes. He  exhibits normal muscle tone. Coordination normal.          Assessment & Plan:  1. Low back pain radiating to both legs Concern about spinal stenosis. I will obtain an MRI of the spine to evaluate - MR Lumbar Spine Wo Contrast; Future  2. Testosterone deficiency Patient's testosterone level was suppressed last fall at 291. After obtaining the patient's MRI will start treating patient for testosterone deficiency with 200 mg of testosterone cypionate injected IM every 2 weeks and then recheck a PSA, CBC, and testosterone level in 3 months. However the patient other cardiovascular risk of testosterone replacement along with increased risk of prostate cancer and elevated hemoglobin.  3. Other malaise and fatigue Leave the patient's fatigue is due to  his testosterone deficiency. TREATING his testosterone deficiency first. His fatigue is not improved within screen the patient for sleep apnea with a split-level sleep study.

## 2013-03-18 ENCOUNTER — Other Ambulatory Visit: Payer: Self-pay | Admitting: Family Medicine

## 2013-03-18 ENCOUNTER — Encounter: Payer: Self-pay | Admitting: Family Medicine

## 2013-03-19 ENCOUNTER — Other Ambulatory Visit: Payer: Self-pay | Admitting: Family Medicine

## 2013-03-19 MED ORDER — CETIRIZINE HCL 10 MG PO TABS: 10.0000 mg | ORAL_TABLET | Freq: Every day | ORAL | Status: DC

## 2013-03-19 NOTE — Telephone Encounter (Signed)
Ok to order??  Last office visit 03/15/2013.

## 2013-03-21 ENCOUNTER — Other Ambulatory Visit

## 2013-03-22 ENCOUNTER — Ambulatory Visit
Admission: RE | Admit: 2013-03-22 | Discharge: 2013-03-22 | Disposition: A | Discharge status: Home / Self Care | Source: Ambulatory Visit | Attending: Family Medicine | Admitting: Family Medicine

## 2013-03-22 DIAGNOSIS — M79604 Pain in right leg: Secondary | ICD-10-CM

## 2013-03-22 DIAGNOSIS — M545 Low back pain: Secondary | ICD-10-CM

## 2013-03-25 ENCOUNTER — Encounter: Payer: Self-pay | Admitting: Family Medicine

## 2013-03-28 ENCOUNTER — Other Ambulatory Visit: Payer: Self-pay | Admitting: Family Medicine

## 2013-03-29 NOTE — Telephone Encounter (Signed)
Medication refilled per protocol. 

## 2013-03-31 ENCOUNTER — Other Ambulatory Visit: Payer: Self-pay | Admitting: Family Medicine

## 2013-03-31 MED ORDER — TESTOSTERONE CYPIONATE 200 MG/ML IM SOLN: 200.0000 mg | INTRAMUSCULAR | Status: DC

## 2013-04-01 ENCOUNTER — Encounter: Payer: Self-pay | Admitting: Family Medicine

## 2013-04-01 ENCOUNTER — Ambulatory Visit (INDEPENDENT_AMBULATORY_CARE_PROVIDER_SITE_OTHER): Admitting: Family Medicine

## 2013-04-01 DIAGNOSIS — E291 Testicular hypofunction: Secondary | ICD-10-CM

## 2013-04-01 MED ORDER — TESTOSTERONE CYPIONATE 200 MG/ML IM SOLN: 200.0000 mg | Freq: Once | INTRAMUSCULAR | Status: DC

## 2013-04-01 NOTE — Progress Notes (Signed)
Patient ID: Zachary Gillespie, male   DOB: 1965/05/22, 48 y.o.   MRN: [id number] Patient came for testosterone injection.  Was given syringes by pharmacy to do at home but needle given was to small to draw up medication.  He said that was all the pharmacy had.  I told him to ask pharmacy for larger needle (23g minimum) or we happy to give injection here.  Pt given his injection in the right gluteal area.  [place] [id number]  Lot 3 [id number]  Exp 06/2014.

## 2013-04-15 ENCOUNTER — Ambulatory Visit (INDEPENDENT_AMBULATORY_CARE_PROVIDER_SITE_OTHER): Admitting: *Deleted

## 2013-04-15 DIAGNOSIS — E291 Testicular hypofunction: Secondary | ICD-10-CM

## 2013-04-15 MED ORDER — TESTOSTERONE CYPIONATE 200 MG/ML IM SOLN
200.0000 mg | Freq: Once | INTRAMUSCULAR | Status: AC
  Administered 2013-04-15: 200 mg via INTRAMUSCULAR

## 2013-04-29 ENCOUNTER — Encounter: Payer: Self-pay | Admitting: Family Medicine

## 2013-04-29 ENCOUNTER — Ambulatory Visit (INDEPENDENT_AMBULATORY_CARE_PROVIDER_SITE_OTHER): Admitting: Family Medicine

## 2013-04-29 VITALS — BP 130/74 | HR 98 | Temp 97.8°F | Resp 16 | Ht 69.0 in | Wt 256.0 lb

## 2013-04-29 DIAGNOSIS — L919 Hypertrophic disorder of the skin, unspecified: Secondary | ICD-10-CM

## 2013-04-29 DIAGNOSIS — L918 Other hypertrophic disorders of the skin: Secondary | ICD-10-CM

## 2013-04-29 DIAGNOSIS — Z9109 Other allergy status, other than to drugs and biological substances: Secondary | ICD-10-CM

## 2013-04-29 DIAGNOSIS — L909 Atrophic disorder of skin, unspecified: Secondary | ICD-10-CM

## 2013-04-29 DIAGNOSIS — E349 Endocrine disorder, unspecified: Secondary | ICD-10-CM

## 2013-04-29 DIAGNOSIS — E291 Testicular hypofunction: Secondary | ICD-10-CM

## 2013-04-29 MED ORDER — FLUTICASONE PROPIONATE 50 MCG/ACT NA SUSP: 2.0000 | Freq: Every day | NASAL | Status: DC

## 2013-04-29 MED ORDER — TESTOSTERONE CYPIONATE 200 MG/ML IM SOLN
200.0000 mg | Freq: Once | INTRAMUSCULAR | Status: AC
  Administered 2013-04-29: 200 mg via INTRAMUSCULAR

## 2013-04-29 NOTE — Progress Notes (Signed)
Subjective:    Patient ID: Zachary Gillespie, male    DOB: 20-Mar-1965, 48 y.o.   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 It is 1 cm in size. It is pedunculated with a 5 mm base.  It is not irritated or infected. It has not been changing. The patient is aggravated by it like it removed. He also has seasonal allergies with sneezing congestion and itchy watery eyes that Zyrtec is not controlling. Past Medical History  Diagnosis Date  . DDD (degenerative disc disease), lumbar   . GERD (gastroesophageal reflux disease)   . Sciatica of left side   . Dyslipidemia   . Obesity   . Hypertension   . Kidney stone    Current Outpatient Prescriptions on File Prior to Visit  Medication Sig Dispense Refill  . celecoxib (CELEBREX) 200 MG capsule TAKE 1 CAPSULE DAILY  90 capsule  3  . cetirizine (ZYRTEC) 10 MG tablet Take 1 tablet (10 mg total) by mouth daily.  90 tablet  3  . losartan (COZAAR) 50 MG tablet TAKE 1 TABLET DAILY  90 tablet  2  . Melatonin 5 MG TABS Take 1 tablet by mouth daily. For sleep      . Multiple Vitamins-Minerals (ONE-A-DAY MENS HEALTH FORMULA PO) Take 1 tablet by mouth daily.      . OMEGA-3 KRILL OIL 300 MG CAPS Take 1 capsule by mouth daily.      Marland Kitchen omeprazole (PRILOSEC) 40 MG capsule Take 1 capsule (40 mg total) by mouth daily.  90 capsule  3  . testosterone cypionate (DEPOTESTOTERONE CYPIONATE) 200 MG/ML injection Inject 1 mL (200 mg total) into the muscle every 14 (fourteen) days.  10 mL  3   Current Facility-Administered Medications on File Prior to Visit  Medication Dose Route Frequency Provider Last Rate Last Dose  . testosterone cypionate (DEPOTESTOTERONE CYPIONATE) injection 200 mg  200 mg Intramuscular Once [name], MD       Allergies  Allergen Reactions  . Penicillins Anaphylaxis  . Keflex [Cephalexin] Other (See Comments)    Childhood allergy  . Sulfa Antibiotics Other (See  Comments)    Childhood allergy   History   Social History  . Marital Status: Married    Spouse Name: N/A    Number of Children: N/A  . Years of Education: N/A   Occupational History  . Not on file.   Social History Main Topics  . Smoking status: Never Smoker   . Smokeless tobacco: Never Used  . Alcohol Use: Yes     Comment: occ  . Drug Use: Not on file  . Sexual Activity: Not on file   Other Topics Concern  . Not on file   Social History Narrative  . No narrative on file      Review of Systems  All other systems reviewed and are negative.      Objective:   Physical Exam  Vitals reviewed. Cardiovascular: Normal rate and regular rhythm.   Pulmonary/Chest: Effort normal and breath sounds normal.   1 cm skin papilloma on his upper medial right thigh        Assessment & Plan:  1. Environmental allergies - fluticasone (FLONASE) 50 MCG/ACT nasal spray; Place 2 sprays into both nostrils daily.  Dispense: 16 g; Refill: 6  2. Skin tag Base of the skin tag was anesthetized with 0.1% lidocaine with epinephrine. The lesion was then  removed with a scapula. Hemostasis was achieved using Drysol and a Band-Aid. The patient tolerated the procedure well without complications.  There was no blood loss.

## 2013-05-13 ENCOUNTER — Ambulatory Visit (INDEPENDENT_AMBULATORY_CARE_PROVIDER_SITE_OTHER): Admitting: *Deleted

## 2013-05-13 DIAGNOSIS — R7989 Other specified abnormal findings of blood chemistry: Secondary | ICD-10-CM

## 2013-05-13 DIAGNOSIS — E291 Testicular hypofunction: Secondary | ICD-10-CM

## 2013-05-13 MED ORDER — TESTOSTERONE CYPIONATE 200 MG/ML IM SOLN
200.0000 mg | Freq: Once | INTRAMUSCULAR | Status: AC
  Administered 2013-05-13: 200 mg via INTRAMUSCULAR

## 2013-05-27 ENCOUNTER — Ambulatory Visit (INDEPENDENT_AMBULATORY_CARE_PROVIDER_SITE_OTHER): Admitting: *Deleted

## 2013-05-27 DIAGNOSIS — E291 Testicular hypofunction: Secondary | ICD-10-CM

## 2013-05-27 MED ORDER — TESTOSTERONE CYPIONATE 200 MG/ML IM SOLN
200.0000 mg | Freq: Once | INTRAMUSCULAR | Status: AC
  Administered 2013-05-27: 200 mg via INTRAMUSCULAR

## 2013-07-22 ENCOUNTER — Telehealth: Payer: Self-pay | Admitting: *Deleted

## 2013-07-22 ENCOUNTER — Other Ambulatory Visit

## 2013-07-22 ENCOUNTER — Ambulatory Visit (INDEPENDENT_AMBULATORY_CARE_PROVIDER_SITE_OTHER): Admitting: *Deleted

## 2013-07-22 DIAGNOSIS — E291 Testicular hypofunction: Secondary | ICD-10-CM

## 2013-07-22 DIAGNOSIS — E785 Hyperlipidemia, unspecified: Secondary | ICD-10-CM

## 2013-07-22 DIAGNOSIS — I10 Essential (primary) hypertension: Secondary | ICD-10-CM

## 2013-07-22 LAB — COMPLETE METABOLIC PANEL WITH GFR
ALK PHOS: 57 U/L (ref 39–117)
ALT: 21 U/L (ref 0–53)
AST: 18 U/L (ref 0–37)
Albumin: 4.2 g/dL (ref 3.5–5.2)
BILIRUBIN TOTAL: 0.4 mg/dL (ref 0.2–1.2)
BUN: 15 mg/dL (ref 6–23)
CO2: 28 mEq/L (ref 19–32)
Calcium: 9.5 mg/dL (ref 8.4–10.5)
Chloride: 102 mEq/L (ref 96–112)
Creat: 0.92 mg/dL (ref 0.50–1.35)
GFR, Est African American: >89 mL/min
GLUCOSE: 101 mg/dL — AB (ref 70–99)
POTASSIUM: 5.1 meq/L (ref 3.5–5.3)
SODIUM: 138 meq/L (ref 135–145)
TOTAL PROTEIN: 6.6 g/dL (ref 6.0–8.3)

## 2013-07-22 LAB — CBC WITH DIFFERENTIAL/PLATELET
Basophils Absolute: 0.1 10*3/uL (ref 0.0–0.1)
Basophils Relative: 1 % (ref 0–1)
Eosinophils Absolute: 0.5 10*3/uL (ref 0.0–0.7)
Eosinophils Relative: 5 % (ref 0–5)
HCT: 43.5 % (ref 39.0–52.0)
HEMOGLOBIN: 14.9 g/dL (ref 13.0–17.0)
LYMPHS ABS: 2.1 10*3/uL (ref 0.7–4.0)
LYMPHS PCT: 23 % (ref 12–46)
MCH: 28.3 pg (ref 26.0–34.0)
MCHC: 34.3 g/dL (ref 30.0–36.0)
MCV: 82.5 fL (ref 78.0–100.0)
Monocytes Absolute: 0.6 10*3/uL (ref 0.1–1.0)
Monocytes Relative: 7 % (ref 3–12)
NEUTROS ABS: 5.8 10*3/uL (ref 1.7–7.7)
NEUTROS PCT: 64 % (ref 43–77)
Platelets: 323 10*3/uL (ref 150–400)
RBC: 5.27 MIL/uL (ref 4.22–5.81)
RDW: 14.7 % (ref 11.5–15.5)
WBC: 9.1 10*3/uL (ref 4.0–10.5)

## 2013-07-22 LAB — LIPID PANEL
CHOL/HDL RATIO: 4.1 ratio
CHOLESTEROL: 163 mg/dL (ref 0–200)
HDL: 40 mg/dL (ref >39)
LDL CALC: 102 mg/dL — AB (ref 0–99)
Triglycerides: 105 mg/dL (ref <150)
VLDL: 21 mg/dL (ref 0–40)

## 2013-07-22 MED ORDER — TESTOSTERONE CYPIONATE 200 MG/ML IM SOLN: 200.0000 mg | INTRAMUSCULAR | Status: DC

## 2013-07-22 MED ORDER — TESTOSTERONE CYPIONATE 200 MG/ML IM SOLN
200.0000 mg | Freq: Once | INTRAMUSCULAR | Status: AC
  Administered 2013-07-22: 200 mg via INTRAMUSCULAR

## 2013-07-22 NOTE — Telephone Encounter (Signed)
Prescription held until labs return.

## 2013-07-22 NOTE — Progress Notes (Signed)
Patient ID: Zachary Gillespie, male   DOB: [birth year], 48 y.o.   MRN: [id number] Patient seen in office for testosterone injection.   Tolerated administration well.

## 2013-07-22 NOTE — Telephone Encounter (Signed)
ok 

## 2013-07-22 NOTE — Addendum Note (Signed)
Addended by: WRAY, [place] on: 07/22/2013 09:15 AM   Modules accepted: Orders

## 2013-07-22 NOTE — Telephone Encounter (Signed)
Ok to refill 

## 2013-07-22 NOTE — Telephone Encounter (Signed)
Patient seen today for labs and injection.   Testosterone level drawn.   Reports that he will require refill on testosterone.   Ok to refill??

## 2013-07-22 NOTE — Telephone Encounter (Signed)
Prescription faxed

## 2013-07-23 ENCOUNTER — Other Ambulatory Visit: Payer: Self-pay | Admitting: *Deleted

## 2013-07-23 LAB — TESTOSTERONE: Testosterone: 260 ng/dL — ABNORMAL LOW (ref 300–890)

## 2013-07-23 MED ORDER — TESTOSTERONE CYPIONATE 200 MG/ML IM SOLN: 300.0000 mg | INTRAMUSCULAR | Status: DC

## 2013-07-23 NOTE — Telephone Encounter (Signed)
New orders obtained to increase Testosterone to 300mg  IM q 2 weeks.   Prescription faxed.

## 2013-08-09 ENCOUNTER — Ambulatory Visit (INDEPENDENT_AMBULATORY_CARE_PROVIDER_SITE_OTHER): Admitting: Family Medicine

## 2013-08-09 DIAGNOSIS — E291 Testicular hypofunction: Secondary | ICD-10-CM

## 2013-08-09 DIAGNOSIS — E349 Endocrine disorder, unspecified: Secondary | ICD-10-CM

## 2013-08-09 MED ORDER — TESTOSTERONE CYPIONATE 200 MG/ML IM SOLN
300.0000 mg | Freq: Once | INTRAMUSCULAR | Status: AC
  Administered 2013-08-09: 300 mg via INTRAMUSCULAR

## 2013-08-23 ENCOUNTER — Ambulatory Visit (INDEPENDENT_AMBULATORY_CARE_PROVIDER_SITE_OTHER): Admitting: Family Medicine

## 2013-08-23 DIAGNOSIS — E291 Testicular hypofunction: Secondary | ICD-10-CM

## 2013-08-23 MED ORDER — TESTOSTERONE CYPIONATE 200 MG/ML IM SOLN
300.0000 mg | INTRAMUSCULAR | Status: DC
Start: 2013-08-23 — End: 2014-10-06
  Administered 2013-08-23 – 2013-09-06 (×2): 300 mg via INTRAMUSCULAR

## 2013-09-06 ENCOUNTER — Ambulatory Visit (INDEPENDENT_AMBULATORY_CARE_PROVIDER_SITE_OTHER): Admitting: Family Medicine

## 2013-09-06 DIAGNOSIS — E291 Testicular hypofunction: Secondary | ICD-10-CM

## 2013-09-20 ENCOUNTER — Ambulatory Visit (INDEPENDENT_AMBULATORY_CARE_PROVIDER_SITE_OTHER): Admitting: *Deleted

## 2013-09-20 DIAGNOSIS — R7989 Other specified abnormal findings of blood chemistry: Secondary | ICD-10-CM

## 2013-09-20 DIAGNOSIS — E291 Testicular hypofunction: Secondary | ICD-10-CM

## 2013-09-20 MED ORDER — TESTOSTERONE CYPIONATE 200 MG/ML IM SOLN
200.0000 mg | Freq: Once | INTRAMUSCULAR | Status: AC
  Administered 2013-09-20: 200 mg via INTRAMUSCULAR

## 2013-10-04 ENCOUNTER — Encounter: Payer: Self-pay | Admitting: Family Medicine

## 2013-10-04 ENCOUNTER — Ambulatory Visit (INDEPENDENT_AMBULATORY_CARE_PROVIDER_SITE_OTHER): Admitting: Family Medicine

## 2013-10-04 VITALS — BP 128/74 | HR 78 | Temp 98.4°F | Resp 16 | Ht 69.0 in | Wt 263.0 lb

## 2013-10-04 DIAGNOSIS — E291 Testicular hypofunction: Secondary | ICD-10-CM

## 2013-10-04 DIAGNOSIS — IMO0002 Reserved for concepts with insufficient information to code with codable children: Secondary | ICD-10-CM

## 2013-10-04 DIAGNOSIS — M7551 Bursitis of right shoulder: Secondary | ICD-10-CM

## 2013-10-04 DIAGNOSIS — M75100 Unspecified rotator cuff tear or rupture of unspecified shoulder, not specified as traumatic: Secondary | ICD-10-CM

## 2013-10-04 LAB — COMPLETE METABOLIC PANEL WITHOUT GFR
ALT: 27 U/L (ref 0–53)
AST: 19 U/L (ref 0–37)
Albumin: 4.6 g/dL (ref 3.5–5.2)
Alkaline Phosphatase: 57 U/L (ref 39–117)
BUN: 14 mg/dL (ref 6–23)
CO2: 25 meq/L (ref 19–32)
Calcium: 9.7 mg/dL (ref 8.4–10.5)
Chloride: 102 meq/L (ref 96–112)
Creat: 1.02 mg/dL (ref 0.50–1.35)
GFR, Est African American: >89 mL/min
GFR, Est Non African American: 88 mL/min
Glucose, Bld: 111 mg/dL — ABNORMAL HIGH (ref 70–99)
Potassium: 5 meq/L (ref 3.5–5.3)
Sodium: 139 meq/L (ref 135–145)
Total Bilirubin: 0.5 mg/dL (ref 0.2–1.2)
Total Protein: 7 g/dL (ref 6.0–8.3)

## 2013-10-04 LAB — CBC WITH DIFFERENTIAL/PLATELET
Basophils Absolute: 0.1 K/uL (ref 0.0–0.1)
Basophils Relative: 1 % (ref 0–1)
Eosinophils Absolute: 0.3 K/uL (ref 0.0–0.7)
Eosinophils Relative: 4 % (ref 0–5)
HCT: 44.7 % (ref 39.0–52.0)
Hemoglobin: 15.3 g/dL (ref 13.0–17.0)
Lymphocytes Relative: 26 % (ref 12–46)
Lymphs Abs: 1.8 K/uL (ref 0.7–4.0)
MCH: 28 pg (ref 26.0–34.0)
MCHC: 34.2 g/dL (ref 30.0–36.0)
MCV: 81.7 fL (ref 78.0–100.0)
Monocytes Absolute: 0.5 K/uL (ref 0.1–1.0)
Monocytes Relative: 7 % (ref 3–12)
Neutro Abs: 4.3 K/uL (ref 1.7–7.7)
Neutrophils Relative %: 62 % (ref 43–77)
Platelets: 348 K/uL (ref 150–400)
RBC: 5.47 MIL/uL (ref 4.22–5.81)
RDW: 15.1 % (ref 11.5–15.5)
WBC: 7 K/uL (ref 4.0–10.5)

## 2013-10-04 NOTE — Progress Notes (Signed)
Subjective:    Patient ID: Zachary Gillespie, male    DOB: April 23, 1965, 48 y.o.   MRN: [id number]  HPI Please see the patient's office visit in July. At that time he was found to have hypogonadism. He is complaining of malaise and fatigue. I started the patient on testosterone. He's been on the testosterone for almost 3 months. He states he sees no improvement in the fatigue. Furthermore he continues to gain weight. He reports low energy on a daily basis. Presently believes that the medication may be causing the swelling. He denies any aggression or anger control issues. He does report some mild depression and anhedonia. He denies any suicidal ideation. He exhibits a lot of his depression to his increasing weight, his chronic back pain, and dissatisfaction with his job. His relationship with his wife is excellent.  He also reports pain in his shoulder which is worse with abduction. The pain has been intermittent and recurrent ever since an injury [place] suffered while [place]. It hurts to raise his arm overhead. He has pain with abduction greater than 120. He also has pain with internal rotation. Examination today is significant for a negative Hawkins maneuver. Negative empty can sign. Normal range of motion. A negative Spurling sign. Some mild pain with internal and external rotation. Past Medical History  Diagnosis Date  . DDD (degenerative disc disease), lumbar   . GERD (gastroesophageal reflux disease)   . Sciatica of left side   . Dyslipidemia   . Obesity   . Hypertension   . Kidney stone    Past Surgical History  Procedure Laterality Date  . Eye surgery    . Vasectomy    . Tonsillectomy     Current Outpatient Prescriptions on File Prior to Visit  Medication Sig Dispense Refill  . celecoxib (CELEBREX) 200 MG capsule TAKE 1 CAPSULE DAILY  90 capsule  3  . cetirizine (ZYRTEC) 10 MG tablet Take 1 tablet (10 mg total) by mouth daily.  90 tablet  3  . fluticasone (FLONASE) 50 MCG/ACT nasal  spray Place 2 sprays into both nostrils daily.  16 g  6  . losartan (COZAAR) 50 MG tablet TAKE 1 TABLET DAILY  90 tablet  2  . Melatonin 5 MG TABS Take 1 tablet by mouth daily. For sleep      . Multiple Vitamins-Minerals (ONE-A-DAY MENS HEALTH FORMULA PO) Take 1 tablet by mouth daily.      . OMEGA-3 KRILL OIL 300 MG CAPS Take 1 capsule by mouth daily.      Marland Kitchen omeprazole (PRILOSEC) 40 MG capsule Take 1 capsule (40 mg total) by mouth daily.  90 capsule  3  . testosterone cypionate (DEPOTESTOTERONE CYPIONATE) 200 MG/ML injection Inject 1.5 mLs (300 mg total) into the muscle every 14 (fourteen) days.  10 mL  3   Current Facility-Administered Medications on File Prior to Visit  Medication Dose Route Frequency Provider Last Rate Last Dose  . testosterone cypionate (DEPOTESTOTERONE CYPIONATE) injection 300 mg  300 mg Intramuscular Q14 Days [name], MD   300 mg at 09/06/13 1209   Allergies  Allergen Reactions  . Penicillins Anaphylaxis  . Keflex [Cephalexin] Other (See Comments)    Childhood allergy  . Sulfa Antibiotics Other (See Comments)    Childhood allergy   History   Social History  . Marital Status: Married    Spouse Name: N/A    Number of Children: N/A  . Years of Education: N/A   Occupational History  .  Not on file.   Social History Main Topics  . Smoking status: Never Smoker   . Smokeless tobacco: Never Used  . Alcohol Use: Yes     Comment: occ  . Drug Use: Not on file  . Sexual Activity: Not on file   Other Topics Concern  . Not on file   Social History Narrative  . No narrative on file      Review of Systems  All other systems reviewed and are negative.      Objective:   Physical Exam  Vitals reviewed. Cardiovascular: Normal rate, regular rhythm and normal heart sounds.   No murmur heard. Pulmonary/Chest: Effort normal and breath sounds normal. No respiratory distress. He has no wheezes. He has no rales.  Abdominal: Soft. Bowel sounds are normal.  He exhibits no distension. There is no tenderness. There is no rebound and no guarding.  Musculoskeletal: He exhibits no edema.          Assessment & Plan:  Hypogonadism in male - Plan: COMPLETE METABOLIC PANEL WITH GFR, CBC with Differential, PSA, Testosterone  Subacromial bursitis, right   Patient symptoms have not improved with testosterone therapy. I will check a testosterone level along with a CBC and a PSA. If his labs are now normal, the patient like to discontinue testosterone as it has not helped. I would then turned our focus toward treating his weight is a means to improve his depression. I believe if we can reduce his rate, the patient would be in less pain and able to exercise more and I believe that this would help with some of his fatigue as well as mild depression. They discussed possibly using belviq or contrave and he is interested in this if the lab work has returned to normal indicating that the testosterone is not effective. I have recommended that the patient take ibuprofen 800 mg by mouth twice a day for 2 weeks for his bursitis. If no better I recommend a cortisone injection.

## 2013-10-05 ENCOUNTER — Encounter: Payer: Self-pay | Admitting: Family Medicine

## 2013-10-05 LAB — PSA: PSA: 1.09 ng/mL (ref <=4.00)

## 2013-10-05 LAB — TESTOSTERONE: Testosterone: 302 ng/dL (ref 300–890)

## 2013-10-07 ENCOUNTER — Other Ambulatory Visit: Payer: Self-pay | Admitting: Family Medicine

## 2013-10-07 MED ORDER — LORCASERIN HCL 10 MG PO TABS: 1.0000 | ORAL_TABLET | Freq: Two times a day (BID) | ORAL | Status: DC

## 2013-11-06 ENCOUNTER — Other Ambulatory Visit: Payer: Self-pay | Admitting: Family Medicine

## 2013-11-08 NOTE — Telephone Encounter (Signed)
Refill appropriate and filled per protocol. 

## 2013-11-10 ENCOUNTER — Other Ambulatory Visit: Payer: Self-pay | Admitting: Family Medicine

## 2013-11-10 MED ORDER — OMEPRAZOLE 40 MG PO CPDR: 40.0000 mg | DELAYED_RELEASE_CAPSULE | Freq: Every day | ORAL | Status: DC

## 2013-11-10 NOTE — Telephone Encounter (Signed)
Med sent to pharm 

## 2013-11-20 ENCOUNTER — Other Ambulatory Visit: Payer: Self-pay | Admitting: Family Medicine

## 2013-11-26 ENCOUNTER — Encounter: Payer: Self-pay | Admitting: Family Medicine

## 2013-11-29 ENCOUNTER — Encounter: Payer: Self-pay | Admitting: Family Medicine

## 2013-11-29 ENCOUNTER — Ambulatory Visit (INDEPENDENT_AMBULATORY_CARE_PROVIDER_SITE_OTHER): Admitting: Family Medicine

## 2013-11-29 VITALS — BP 132/90 | HR 94 | Temp 98.2°F | Resp 14 | Ht 69.0 in | Wt 247.0 lb

## 2013-11-29 DIAGNOSIS — J069 Acute upper respiratory infection, unspecified: Secondary | ICD-10-CM

## 2013-11-29 MED ORDER — AZITHROMYCIN 250 MG PO TABS: ORAL_TABLET | ORAL | Status: DC

## 2013-11-29 MED ORDER — HYDROCODONE-HOMATROPINE 5-1.5 MG/5ML PO SYRP: 5.0000 mL | ORAL_SOLUTION | Freq: Three times a day (TID) | ORAL | Status: DC | PRN

## 2013-11-29 NOTE — Progress Notes (Signed)
Subjective:    Patient ID: Zachary Gillespie, male    DOB: [birth year], 48 y.o.   MRN: [id number]  HPI Since symptoms began Friday. He reports a sore throat, a cough productive of green sputum. He also reports head congestion and chest congestion. He denies any fever. He denies any chest pain. He denies any shortness of breath. He denies any nausea vomiting or diarrhea. He denies any ear pain. Past Medical History  Diagnosis Date  . DDD (degenerative disc disease), lumbar   . GERD (gastroesophageal reflux disease)   . Sciatica of left side   . Dyslipidemia   . Obesity   . Hypertension   . Kidney stone    Past Surgical History  Procedure Laterality Date  . Eye surgery    . Vasectomy    . Tonsillectomy     Current Outpatient Prescriptions on File Prior to Visit  Medication Sig Dispense Refill  . celecoxib (CELEBREX) 200 MG capsule TAKE 1 CAPSULE DAILY 90 capsule 3  . cetirizine (ZYRTEC) 10 MG tablet Take 1 tablet (10 mg total) by mouth daily. 90 tablet 3  . fluticasone (FLONASE) 50 MCG/ACT nasal spray Place 2 sprays into both nostrils daily. 16 g 6  . Lorcaserin HCl (BELVIQ) 10 MG TABS Take 1 tablet by mouth 2 (two) times daily. 60 tablet 3  . losartan (COZAAR) 50 MG tablet TAKE 1 TABLET DAILY 90 tablet 1  . Melatonin 5 MG TABS Take 1 tablet by mouth daily. For sleep    . Multiple Vitamins-Minerals (ONE-A-DAY MENS HEALTH FORMULA PO) Take 1 tablet by mouth daily.    . OMEGA-3 KRILL OIL 300 MG CAPS Take 1 capsule by mouth daily.    Marland Kitchen omeprazole (PRILOSEC) 40 MG capsule Take 1 capsule (40 mg total) by mouth daily. 90 capsule 3  . omeprazole (PRILOSEC) 40 MG capsule TAKE ONE CAPSULE BY MOUTH DAILY 90 capsule 3  . testosterone cypionate (DEPOTESTOTERONE CYPIONATE) 200 MG/ML injection Inject 1.5 mLs (300 mg total) into the muscle every 14 (fourteen) days. 10 mL 3   Current Facility-Administered Medications on File Prior to Visit  Medication Dose Route Frequency Provider Last Rate Last  Dose  . testosterone cypionate (DEPOTESTOTERONE CYPIONATE) injection 300 mg  300 mg Intramuscular Q14 Days [name], MD   300 mg at 09/06/13 1209   Allergies  Allergen Reactions  . Penicillins Anaphylaxis  . Keflex [Cephalexin] Other (See Comments)    Childhood allergy  . Sulfa Antibiotics Other (See Comments)    Childhood allergy   History   Social History  . Marital Status: Married    Spouse Name: N/A    Number of Children: N/A  . Years of Education: N/A   Occupational History  . Not on file.   Social History Main Topics  . Smoking status: Never Smoker   . Smokeless tobacco: Never Used  . Alcohol Use: Yes     Comment: occ  . Drug Use: Not on file  . Sexual Activity: Not on file   Other Topics Concern  . Not on file   Social History Narrative      Review of Systems  All other systems reviewed and are negative.      Objective:   Physical Exam  Constitutional: He appears well-developed and well-nourished. No distress.  HENT:  Right Ear: External ear normal.  Left Ear: External ear normal.  Nose: Nose normal.  Mouth/Throat: Oropharynx is clear and moist. No oropharyngeal exudate.  Eyes: Conjunctivae are normal.  Pupils are equal, round, and reactive to light. Right eye exhibits no discharge. Left eye exhibits no discharge. No scleral icterus.  Neck: Neck supple.  Cardiovascular: Normal rate, regular rhythm and normal heart sounds.   Pulmonary/Chest: Effort normal and breath sounds normal. No respiratory distress. He has no wheezes. He has no rales.  Lymphadenopathy:    He has no cervical adenopathy.  Skin: He is not diaphoretic.  Vitals reviewed.         Assessment & Plan:  URI, acute - Plan: azithromycin (ZITHROMAX) 250 MG tablet, HYDROcodone-homatropine (HYCODAN) 5-1.5 MG/5ML syrup  Patient symptoms are consistent with a viral upper respiratory infection. I recommended a tincture of time. I recommended supportive care with Sudafed, Mucinex.  He can use Vicodin as needed for severe cough. I did give the patient a prescription for Z-Pak with strict instructions not to fill unless he develops a high fever, purulent sputum, chest pain, and/or shortness of breath.

## 2014-01-10 ENCOUNTER — Encounter: Payer: Self-pay | Admitting: Family Medicine

## 2014-01-25 ENCOUNTER — Other Ambulatory Visit: Payer: Self-pay | Admitting: Family Medicine

## 2014-01-25 ENCOUNTER — Encounter: Payer: Self-pay | Admitting: Family Medicine

## 2014-01-25 NOTE — Telephone Encounter (Signed)
Was ordered 12/3 #30 + 2 refills??

## 2014-01-28 ENCOUNTER — Other Ambulatory Visit: Payer: Self-pay | Admitting: Family Medicine

## 2014-02-04 ENCOUNTER — Other Ambulatory Visit: Payer: Self-pay | Admitting: Family Medicine

## 2014-04-03 ENCOUNTER — Other Ambulatory Visit: Payer: Self-pay | Admitting: Family Medicine

## 2014-05-05 ENCOUNTER — Encounter: Payer: Self-pay | Admitting: Family Medicine

## 2014-05-06 ENCOUNTER — Other Ambulatory Visit: Payer: Self-pay | Admitting: Family Medicine

## 2014-05-06 MED ORDER — PHENTERMINE HCL 37.5 MG PO TABS: 37.5000 mg | ORAL_TABLET | Freq: Every day | ORAL | Status: DC

## 2014-09-05 ENCOUNTER — Encounter: Payer: Self-pay | Admitting: Family Medicine

## 2014-09-05 DIAGNOSIS — Z9109 Other allergy status, other than to drugs and biological substances: Secondary | ICD-10-CM

## 2014-09-05 MED ORDER — FLUTICASONE PROPIONATE 50 MCG/ACT NA SUSP: 2.0000 | Freq: Every day | NASAL | Status: DC

## 2014-09-05 NOTE — Telephone Encounter (Signed)
Medication called/sent to requested pharmacy  

## 2014-09-09 ENCOUNTER — Encounter: Payer: Self-pay | Admitting: Family Medicine

## 2014-09-29 ENCOUNTER — Other Ambulatory Visit: Payer: Self-pay | Admitting: Family Medicine

## 2014-10-06 ENCOUNTER — Encounter: Payer: Self-pay | Admitting: Family Medicine

## 2014-10-06 ENCOUNTER — Ambulatory Visit (INDEPENDENT_AMBULATORY_CARE_PROVIDER_SITE_OTHER): Admitting: Family Medicine

## 2014-10-06 VITALS — BP 126/90 | HR 78 | Temp 98.6°F | Resp 18 | Ht 69.0 in | Wt 252.0 lb

## 2014-10-06 DIAGNOSIS — E785 Hyperlipidemia, unspecified: Secondary | ICD-10-CM

## 2014-10-06 DIAGNOSIS — Z0000 Encounter for general adult medical examination without abnormal findings: Secondary | ICD-10-CM | POA: Diagnosis not present

## 2014-10-06 DIAGNOSIS — E291 Testicular hypofunction: Secondary | ICD-10-CM

## 2014-10-06 DIAGNOSIS — I10 Essential (primary) hypertension: Secondary | ICD-10-CM | POA: Diagnosis not present

## 2014-10-06 LAB — CBC WITH DIFFERENTIAL/PLATELET
BASOS ABS: 0.1 10*3/uL (ref 0.0–0.1)
Basophils Relative: 1 % (ref 0–1)
EOS PCT: 4 % (ref 0–5)
Eosinophils Absolute: 0.3 10*3/uL (ref 0.0–0.7)
HEMATOCRIT: 43.5 % (ref 39.0–52.0)
Hemoglobin: 15 g/dL (ref 13.0–17.0)
LYMPHS ABS: 2.2 10*3/uL (ref 0.7–4.0)
Lymphocytes Relative: 25 % (ref 12–46)
MCH: 29.8 pg (ref 26.0–34.0)
MCHC: 34.5 g/dL (ref 30.0–36.0)
MCV: 86.5 fL (ref 78.0–100.0)
MONO ABS: 0.6 10*3/uL (ref 0.1–1.0)
MPV: 10.1 fL (ref 8.6–12.4)
Monocytes Relative: 7 % (ref 3–12)
NEUTROS ABS: 5.5 10*3/uL (ref 1.7–7.7)
Neutrophils Relative %: 63 % (ref 43–77)
Platelets: 297 10*3/uL (ref 150–400)
RBC: 5.03 MIL/uL (ref 4.22–5.81)
RDW: 14 % (ref 11.5–15.5)
WBC: 8.7 10*3/uL (ref 4.0–10.5)

## 2014-10-06 LAB — COMPLETE METABOLIC PANEL WITH GFR
ALK PHOS: 81 U/L (ref 40–115)
ALT: 25 U/L (ref 9–46)
AST: 16 U/L (ref 10–40)
Albumin: 4.5 g/dL (ref 3.6–5.1)
BUN: 16 mg/dL (ref 7–25)
CALCIUM: 9.7 mg/dL (ref 8.6–10.3)
CHLORIDE: 100 mmol/L (ref 98–110)
CO2: 26 mmol/L (ref 20–31)
CREATININE: 0.89 mg/dL (ref 0.60–1.35)
GFR, Est African American: >89 mL/min (ref >=60)
GFR, Est Non African American: >89 mL/min (ref >=60)
Glucose, Bld: 102 mg/dL — ABNORMAL HIGH (ref 70–99)
Potassium: 4.7 mmol/L (ref 3.5–5.3)
Sodium: 139 mmol/L (ref 135–146)
Total Bilirubin: 0.3 mg/dL (ref 0.2–1.2)
Total Protein: 6.9 g/dL (ref 6.1–8.1)

## 2014-10-06 LAB — LIPID PANEL
CHOLESTEROL: 172 mg/dL (ref 125–200)
HDL: 39 mg/dL — ABNORMAL LOW (ref >=40)
LDL Cholesterol: 96 mg/dL (ref <130)
TRIGLYCERIDES: 186 mg/dL — AB (ref <150)
Total CHOL/HDL Ratio: 4.4 Ratio (ref <=5.0)
VLDL: 37 mg/dL — ABNORMAL HIGH (ref <30)

## 2014-10-06 MED ORDER — LOSARTAN POTASSIUM 100 MG PO TABS: 100.0000 mg | ORAL_TABLET | Freq: Every day | ORAL | Status: DC

## 2014-10-06 NOTE — Progress Notes (Signed)
Subjective:    Patient ID: Zachary Gillespie, male    DOB: 1965/03/10, 49 y.o.   MRN: [id number]  HPI Patient is a very pleasant 49 year old white male who is here today for complete physical exam. He has no family history of premature colon cancer or prostate cancer. Therefore he is not due for colonoscopy or prostate exam until age 49. He does have a history of hypogonadism. However he discontinued testosterone in quite some time ago. He would still like to check his PSA.  Otherwise he is doing well. His diastolic blood pressure is borderline elevated today. His weight is also elevated. Patient admits that he is eating a lot of carbohydrates and that he is not engaging in regular aerobic exercise. He still works for [employer]. Patient gets his flu shot at work. Past Medical History  Diagnosis Date  . DDD (degenerative disc disease), lumbar   . GERD (gastroesophageal reflux disease)   . Sciatica of left side   . Dyslipidemia   . Obesity   . Hypertension   . Kidney stone    Past Surgical History  Procedure Laterality Date  . Eye surgery    . Vasectomy    . Tonsillectomy     Current Outpatient Prescriptions on File Prior to Visit  Medication Sig Dispense Refill  . CELEBREX 200 MG capsule TAKE 1 CAPSULE DAILY 90 capsule 2  . fluticasone (FLONASE) 50 MCG/ACT nasal spray Place 2 sprays into both nostrils daily. 16 g 6  . losartan (COZAAR) 50 MG tablet TAKE 1 TABLET DAILY 90 tablet 3  . Melatonin 5 MG TABS Take 1 tablet by mouth daily. For sleep    . Multiple Vitamins-Minerals (ONE-A-DAY MENS HEALTH FORMULA PO) Take 1 tablet by mouth daily.    . OMEGA-3 KRILL OIL 300 MG CAPS Take 1 capsule by mouth daily.    Marland Kitchen omeprazole (PRILOSEC) 40 MG capsule TAKE 1 CAPSULE DAILY 90 capsule 3  . ZYRTEC ALLERGY 10 MG tablet TAKE 1 TABLET DAILY 90 tablet 3   No current facility-administered medications on file prior to visit.   Allergies  Allergen Reactions  . Penicillins Anaphylaxis  . Keflex  [Cephalexin] Other (See Comments)    Childhood allergy  . Sulfa Antibiotics Other (See Comments)    Childhood allergy   Social History   Social History  . Marital Status: Married    Spouse Name: N/A  . Number of Children: N/A  . Years of Education: N/A   Occupational History  . Not on file.   Social History Main Topics  . Smoking status: Never Smoker   . Smokeless tobacco: Never Used  . Alcohol Use: Yes     Comment: occ  . Drug Use: Not on file  . Sexual Activity: Not on file   Other Topics Concern  . Not on file   Social History Narrative   .[email or web address]   Review of Systems  All other systems reviewed and are negative.      Objective:   Physical Exam  Constitutional: He is oriented to person, place, and time. He appears well-developed and well-nourished. No distress.  HENT:  Head: Normocephalic and atraumatic.  Right Ear: External ear normal.  Left Ear: External ear normal.  Nose: Nose normal.  Mouth/Throat: Oropharynx is clear and moist. No oropharyngeal exudate.  Eyes: Conjunctivae and EOM are normal. Pupils are equal, round, and reactive to light. Right eye exhibits no discharge. Left eye exhibits no discharge. No scleral icterus.  Neck: Normal range  of motion. Neck supple. No JVD present. No tracheal deviation present. No thyromegaly present.  Cardiovascular: Normal rate, regular rhythm, normal heart sounds and intact distal pulses.  Exam reveals no gallop and no friction rub.   No murmur heard. Pulmonary/Chest: Effort normal and breath sounds normal. No stridor. No respiratory distress. He has no wheezes. He has no rales. He exhibits no tenderness.  Abdominal: Soft. Bowel sounds are normal. He exhibits no distension and no mass. There is no tenderness. There is no rebound and no guarding.  Genitourinary: Penis normal.  Musculoskeletal: Normal range of motion. He exhibits no edema or tenderness.  Lymphadenopathy:    He has no cervical adenopathy.  Neurological:  He is alert and oriented to person, place, and time. He has normal reflexes. He displays normal reflexes. No cranial nerve deficit. He exhibits normal muscle tone. Coordination normal.  Skin: Skin is warm. No rash noted. He is not diaphoretic. No erythema. No pallor.  Psychiatric: He has a normal mood and affect. His behavior is normal. Judgment and thought content normal.  Vitals reviewed.         Assessment & Plan:  Routine general medical examination at a health care facility - Plan: CBC with Differential/Platelet, COMPLETE METABOLIC PANEL WITH GFR, Lipid panel, PSA, Testosterone  Hypogonadism in male - Plan: PSA, Testosterone  Essential hypertension - Plan: COMPLETE METABOLIC PANEL WITH GFR, Lipid panel, losartan (COZAAR) 100 MG tablet  Dyslipidemia - Plan: COMPLETE METABOLIC PANEL WITH GFR, Lipid panel  Physical exam today is significant only for mildly elevated diastolic blood pressure and weight and elevated BMI. I have encouraged decreasing carbohydrates in the diet and also 30 minutes a day 5 days a week of aerobic exercise. I would also like the patient increase losartan to 100 mg a day. I will check a CBC, CMP, fasting lipid panel, PSA, and a testosterone level. Patient's immunizations are up-to-date. Patient's cancer screening is up-to-date. Recheck in one year or as necessary

## 2014-10-07 ENCOUNTER — Encounter: Payer: Self-pay | Admitting: Family Medicine

## 2014-10-07 LAB — TESTOSTERONE: TESTOSTERONE: 195 ng/dL — AB (ref 300–890)

## 2014-10-07 LAB — PSA: PSA: 1.43 ng/mL (ref <=4.00)

## 2014-10-08 ENCOUNTER — Other Ambulatory Visit: Payer: Self-pay | Admitting: Family Medicine

## 2015-01-10 ENCOUNTER — Other Ambulatory Visit: Payer: Self-pay | Admitting: Family Medicine

## 2015-01-19 ENCOUNTER — Encounter: Payer: Self-pay | Admitting: Family Medicine

## 2015-01-19 ENCOUNTER — Ambulatory Visit (INDEPENDENT_AMBULATORY_CARE_PROVIDER_SITE_OTHER): Admitting: Family Medicine

## 2015-01-19 VITALS — BP 140/78 | HR 84 | Temp 98.4°F | Resp 18 | Ht 69.0 in | Wt 262.0 lb

## 2015-01-19 DIAGNOSIS — M47816 Spondylosis without myelopathy or radiculopathy, lumbar region: Secondary | ICD-10-CM | POA: Diagnosis not present

## 2015-01-19 DIAGNOSIS — M5136 Other intervertebral disc degeneration, lumbar region: Secondary | ICD-10-CM

## 2015-01-19 NOTE — Progress Notes (Signed)
Subjective:    Patient ID: Zachary Gillespie, male    DOB: 1965/12/04, 50 y.o.   MRN: [id number]  HPI  Patient is here requesting medical clearance for work.   Patient serves an [employer]. They require the patient  To be able to carry a 40 pound backpack and perform other various physical task necessary for deployment. He has a history of degenerative disc disease in his lumbar spine with spondylosis. However he denies any symptoms of myelopathy or radiculopathy. He denies any numbness or tingling in his legs. He denies any weakness in his legs. He denies any severe low back pain. He does have low back pain which she takes Celebrex 200 mg by mouth daily and this is able to control the pain. He is still able to perform PT on a daily basis. He denies any symptoms of cauda equina syndrome. He is requesting to be able to work. He states that the pain does not limit him in his ability to perform his jobs to any degree. MRI of lumbar spine in 2015 revealed: L3-4: Borderline abutment of the right L3 nerve in the lateral extraforaminal space due to disc bulge. Borderline right foraminal stenosis.  L4-5: Borderline bilateral foraminal stenosis and borderline left subarticular lateral recess stenosis due to diffuse disc bulge. Mild central annular tearing.  L5-S1: Borderline bilateral foraminal stenosis due to facet arthropathy and intervertebral spurring. Mild disc bulge. Past Medical History  Diagnosis Date  . DDD (degenerative disc disease), lumbar   . GERD (gastroesophageal reflux disease)   . Sciatica of left side   . Dyslipidemia   . Obesity   . Hypertension   . Kidney stone    Past Surgical History  Procedure Laterality Date  . Eye surgery    . Vasectomy    . Tonsillectomy     Current Outpatient Prescriptions on File Prior to Visit  Medication Sig Dispense Refill  . celecoxib (CELEBREX) 200 MG capsule TAKE 1 CAPSULE DAILY 90 capsule 3  . cetirizine (ZYRTEC) 10 MG tablet TAKE 1 TABLET  DAILY 90 tablet 3  . fluticasone (FLONASE) 50 MCG/ACT nasal spray Place 2 sprays into both nostrils daily. 16 g 6  . losartan (COZAAR) 100 MG tablet Take 1 tablet (100 mg total) by mouth daily. 90 tablet 3  . Melatonin 5 MG TABS Take 1 tablet by mouth daily. For sleep    . Multiple Vitamins-Minerals (ONE-A-DAY MENS HEALTH FORMULA PO) Take 1 tablet by mouth daily.    . OMEGA-3 KRILL OIL 300 MG CAPS Take 1 capsule by mouth daily.    Marland Kitchen omeprazole (PRILOSEC) 40 MG capsule TAKE 1 CAPSULE DAILY 90 capsule 3   No current facility-administered medications on file prior to visit.   Allergies  Allergen Reactions  . Penicillins Anaphylaxis  . Keflex [Cephalexin] Other (See Comments)    Childhood allergy  . Sulfa Antibiotics Other (See Comments)    Childhood allergy   Social History   Social History  . Marital Status: Married    Spouse Name: N/A  . Number of Children: N/A  . Years of Education: N/A   Occupational History  . Not on file.   Social History Main Topics  . Smoking status: Never Smoker   . Smokeless tobacco: Never Used  . Alcohol Use: Yes     Comment: occ  . Drug Use: Not on file  . Sexual Activity: Not on file   Other Topics Concern  . Not on file   Social History Narrative  Review of Systems  All other systems reviewed and are negative.      Objective:   Physical Exam  Constitutional: He is oriented to person, place, and time. He appears well-developed and well-nourished.  Cardiovascular: Normal rate, regular rhythm and normal heart sounds.  Exam reveals no gallop and no friction rub.   No murmur heard. Pulmonary/Chest: Effort normal and breath sounds normal. No respiratory distress. He has no wheezes. He has no rales.  Musculoskeletal:       Lumbar back: He exhibits normal range of motion, no tenderness, no bony tenderness, no swelling, no deformity, no pain and no spasm.  Neurological: He is alert and oriented to person, place, and time. He has normal  reflexes. He displays normal reflexes. No cranial nerve deficit. He exhibits normal muscle tone. Coordination normal.  Vitals reviewed.         Assessment & Plan:  DDD (degenerative disc disease), lumbar  Spondylosis of lumbar region without myelopathy or radiculopathy   patient is medically cleared to serve in the [employer]. He can perform the necessary tasks as outlined on his medical clearance form. Should his back pain worsen or should he develop symptoms of myelopathy or radiculopathy we may need to reassess but at the present time his symptoms are managed with Celebrex 200 mg a day. I did recommend aggressive weight loss as I believe this will help prevent deterioration of his degenerative disc disease and help prevent complications in the future. His blood pressure today is borderline elevated. I recommended the patient check his blood pressure on a daily basis and report the values to me in 2 weeks. We may need to switch the patient to Hyzaar. Fasting lab work in September was excellent except for mild elevations in his blood sugar. We also discussed a low carbohydrate diet. I believe the crux of the patient's problem is his elevated body weight. I believe the patient can lose 30 pounds the majority of his medical problems will improve.

## 2015-01-26 ENCOUNTER — Encounter: Payer: Self-pay | Admitting: Family Medicine

## 2015-03-14 ENCOUNTER — Encounter: Payer: Self-pay | Admitting: Family Medicine

## 2015-03-16 ENCOUNTER — Other Ambulatory Visit: Payer: Self-pay | Admitting: Family Medicine

## 2015-03-16 ENCOUNTER — Encounter: Payer: Self-pay | Admitting: Family Medicine

## 2015-03-16 MED ORDER — CYCLOBENZAPRINE HCL 10 MG PO TABS: 10.0000 mg | ORAL_TABLET | Freq: Three times a day (TID) | ORAL | Status: DC | PRN

## 2015-03-16 NOTE — Telephone Encounter (Signed)
Med sent to different pharm and [facility] called and cxd previous rx

## 2015-09-13 ENCOUNTER — Other Ambulatory Visit: Payer: Self-pay | Admitting: Family Medicine

## 2015-09-13 DIAGNOSIS — I10 Essential (primary) hypertension: Secondary | ICD-10-CM

## 2015-09-20 ENCOUNTER — Other Ambulatory Visit: Payer: Self-pay | Admitting: Family Medicine

## 2015-09-20 DIAGNOSIS — Z9109 Other allergy status, other than to drugs and biological substances: Secondary | ICD-10-CM

## 2015-09-20 MED ORDER — FLUTICASONE PROPIONATE 50 MCG/ACT NA SUSP: 2.0000 | Freq: Every day | NASAL | 4 refills | Status: DC

## 2015-09-24 ENCOUNTER — Other Ambulatory Visit: Payer: Self-pay | Admitting: Family Medicine

## 2015-10-07 ENCOUNTER — Other Ambulatory Visit: Payer: Self-pay | Admitting: Family Medicine

## 2015-11-06 ENCOUNTER — Telehealth: Payer: Self-pay | Admitting: Family Medicine

## 2015-11-06 NOTE — Telephone Encounter (Signed)
I saw him in Jan.  Could we get his specific question and I'll be glad to try to answer.

## 2015-11-06 NOTE — Telephone Encounter (Signed)
Patient left vm stating he had a question about pain he had. He requested to have Dr. Dennard Schaumann call him about the pain.    I have called him back to see if we could get him an appt.  CB#[phone]

## 2015-11-09 NOTE — Telephone Encounter (Signed)
Called and spoke to pt and he said that the pain is gone and if it recurs he will call and get an appt.

## 2015-11-22 ENCOUNTER — Encounter: Payer: Self-pay | Admitting: Family Medicine

## 2015-11-29 ENCOUNTER — Ambulatory Visit (INDEPENDENT_AMBULATORY_CARE_PROVIDER_SITE_OTHER): Admitting: Family Medicine

## 2015-11-29 ENCOUNTER — Encounter: Payer: Self-pay | Admitting: Family Medicine

## 2015-11-29 VITALS — BP 130/78 | HR 98 | Temp 98.8°F | Resp 18 | Ht 69.0 in | Wt 268.0 lb

## 2015-11-29 DIAGNOSIS — F321 Major depressive disorder, single episode, moderate: Secondary | ICD-10-CM | POA: Diagnosis not present

## 2015-11-29 MED ORDER — VORTIOXETINE HBR 10 MG PO TABS: 10.0000 mg | ORAL_TABLET | Freq: Every day | ORAL | 3 refills | Status: DC

## 2015-11-29 NOTE — Progress Notes (Signed)
Subjective:    Patient ID: Zachary Gillespie, male    DOB: 11-20-1965, 50 y.o.   MRN: [id number]  HPI Patient symptoms began approximate 6 months ago. They have steadily been worsening. Symptoms consist of depression and anhedonia. He also reports trouble sleeping and poor concentration. He denies any suicidal ideation. He denies any hallucinations or delusions. Patient states that he finds no [place] life. He has no desire or energy even to get out of bed in the morning. Symptoms are worsened by stress at work. He is a member the [employer] and does thread of future deployment. 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 He denies any mania. He denies any hallucinations. He previously tried Lexapro in the past and experienced significant weight gain. Past Medical History:  Diagnosis Date  . DDD (degenerative disc disease), lumbar   . Dyslipidemia   . GERD (gastroesophageal reflux disease)   . Hypertension   . Kidney stone   . Obesity   . Sciatica of left side    Past Surgical History:  Procedure Laterality Date  . EYE SURGERY    . TONSILLECTOMY    . VASECTOMY     Current Outpatient Prescriptions on File Prior to Visit  Medication Sig Dispense Refill  . celecoxib (CELEBREX) 200 MG capsule TAKE 1 CAPSULE DAILY 90 capsule 3  . cetirizine (ZYRTEC) 10 MG tablet TAKE 1 TABLET DAILY 90 tablet 3  . cyclobenzaprine (FLEXERIL) 10 MG tablet Take 1 tablet (10 mg total) by mouth 3 (three) times daily as needed for muscle spasms. 30 tablet 0  . fluticasone (FLONASE) 50 MCG/ACT nasal spray Place 2 sprays into both nostrils daily. 48 g 4  . losartan (COZAAR) 100 MG tablet TAKE 1 TABLET DAILY 90 tablet 3  . Melatonin 5 MG TABS Take 1 tablet by mouth daily. For sleep    . Multiple Vitamins-Minerals (ONE-A-DAY MENS HEALTH FORMULA PO) Take 1 tablet by mouth daily.    . OMEGA-3 KRILL OIL 300 MG CAPS Take 1 capsule by mouth daily.    Marland Kitchen omeprazole  (PRILOSEC) 40 MG capsule TAKE 1 CAPSULE DAILY 90 capsule 3   No current facility-administered medications on file prior to visit.    Allergies  Allergen Reactions  . Penicillins Anaphylaxis  . Keflex [Cephalexin] Other (See Comments)    Childhood allergy  . Sulfa Antibiotics Other (See Comments)    Childhood allergy   Social History   Social History  . Marital status: Married    Spouse name: N/A  . Number of children: N/A  . Years of education: N/A   Occupational History  . Not on file.   Social History Main Topics  . Smoking status: Never Smoker  . Smokeless tobacco: Never Used  . Alcohol use Yes     Comment: occ  . Drug use: Unknown  . Sexual activity: Not on file   Other Topics Concern  . Not on file   Social History Narrative  . No narrative on file      Review of Systems  All other systems reviewed and are negative.      Objective:   Physical Exam  Cardiovascular: Normal rate, regular rhythm and normal heart sounds.   No murmur heard. Pulmonary/Chest: Effort normal and breath sounds normal. No respiratory distress. He has no wheezes. He has no rales.  Abdominal: Soft. Bowel sounds are normal.  Psychiatric: He has a normal mood and affect. His behavior is normal.  Judgment and thought content normal.  Vitals reviewed.         Assessment & Plan:  Moderate single current episode of major depressive disorder [facility])  Patient has depression. He would like to try an alternative that does not cause weight gain similar to Lexapro. Therefore I'll try the patient on Trintellix 5 mg poqday for 1 week and then 10 mg poqday.  Recheck in 4 weeks

## 2015-12-07 ENCOUNTER — Telehealth: Payer: Self-pay | Admitting: *Deleted

## 2015-12-07 NOTE — Telephone Encounter (Signed)
Received request from pharmacy for PA on [employer].   Sponsor # for [employer]: [id number]  PA submitted.   Dx: F32.1- major depression.

## 2015-12-08 ENCOUNTER — Encounter: Payer: Self-pay | Admitting: Family Medicine

## 2015-12-11 ENCOUNTER — Encounter: Payer: Self-pay | Admitting: Family Medicine

## 2015-12-12 NOTE — Telephone Encounter (Signed)
Received request for further documentation in regards to PA.   Form completed and faxed to [facility] at [address] [place].

## 2015-12-14 NOTE — Telephone Encounter (Signed)
Call placed to [employer] to inquire as to status of PA.   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   Will fax FSR to office.

## 2015-12-15 NOTE — Telephone Encounter (Signed)
Received PA determination.   PA denied.   The information provided does not meet TRICARE criteria to establish a medical necessity of the requested medication over the formulary medications.   Formulary medications include: SSRI-  Citalopram  Escitalopram  Fluoxetine  Paroxetine  Sertraline SNRI-  Venlafaxine  Duloxetine TCA-  Amitriptyline   Desipramine  Imipramine  Nortriptyline Serotonin Reuptake Inhibitors-   Trazodone  Nefazodone MAOI-  Phenelzine  Tranylcypromine Mirtazazpine Bupropion   MD please advise.

## 2015-12-15 NOTE — Telephone Encounter (Signed)
Received FSR. Noted that FSR is form that was completed previously under more documentation request.

## 2015-12-18 ENCOUNTER — Encounter: Payer: Self-pay | Admitting: *Deleted

## 2015-12-18 MED ORDER — ESCITALOPRAM OXALATE 10 MG PO TABS: 10.0000 mg | ORAL_TABLET | Freq: Every day | ORAL | 1 refills | Status: DC

## 2015-12-18 NOTE — Telephone Encounter (Signed)
Patient aware per MyChart.   Prescription sent to pharmacy.  

## 2015-12-18 NOTE — Telephone Encounter (Signed)
Try lexapro 10  poqday and notify the patient why.  DC [employer].

## 2016-01-05 ENCOUNTER — Other Ambulatory Visit: Payer: Self-pay | Admitting: Family Medicine

## 2016-01-17 ENCOUNTER — Encounter: Payer: Self-pay | Admitting: Family Medicine

## 2016-02-22 ENCOUNTER — Ambulatory Visit
Admission: RE | Admit: 2016-02-22 | Discharge: 2016-02-22 | Disposition: A | Discharge status: Home / Self Care | Source: Ambulatory Visit | Attending: Family Medicine | Admitting: Family Medicine

## 2016-02-22 ENCOUNTER — Encounter: Payer: Self-pay | Admitting: Family Medicine

## 2016-02-22 ENCOUNTER — Ambulatory Visit (INDEPENDENT_AMBULATORY_CARE_PROVIDER_SITE_OTHER): Admitting: Family Medicine

## 2016-02-22 VITALS — BP 136/78 | HR 84 | Temp 98.1°F | Resp 18 | Ht 69.0 in | Wt 266.0 lb

## 2016-02-22 DIAGNOSIS — M5136 Other intervertebral disc degeneration, lumbar region: Secondary | ICD-10-CM | POA: Diagnosis not present

## 2016-02-22 DIAGNOSIS — M51369 Other intervertebral disc degeneration, lumbar region without mention of lumbar back pain or lower extremity pain: Secondary | ICD-10-CM

## 2016-02-22 DIAGNOSIS — G4710 Hypersomnia, unspecified: Secondary | ICD-10-CM | POA: Diagnosis not present

## 2016-02-22 DIAGNOSIS — R0683 Snoring: Secondary | ICD-10-CM | POA: Diagnosis not present

## 2016-02-22 NOTE — Progress Notes (Signed)
Subjective:    Patient ID: Zachary Gillespie, male    DOB: 1965/01/20, 51 y.o.   MRN: [id number]  HPI He reports gradually worsening snoring.  Over the last few years, it has gotten to the point that his wife sleeps in a separate bedroom. 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 His wife and other people with mention that he makes weird gasping sounds in his sleep at night. He reports hypersomnia. He can easily fall asleep after eating a meal. He can easily fall asleep while driving a car. He can easily fall asleep while passenger in a car. He can easily fall asleep while reading. He never feels rested, he always feels tired. Epworth sleepiness score is 10.  He also has a history of degenerative disc disease in his back in the lumbar spine on MRI from 2015. He states that his back is steadily gotten worse as his weight has gotten higher. He reports muscle stiffness and soreness constantly now. It hurts every time he stands up. It hurts every time he gets out of bed. It hurts when he walks. His muscles feel tight and stiff on a daily basis in his lower back. He has not been able to lose any weight. He is trying Celebrex with little relief Past Medical History:  Diagnosis Date  . DDD (degenerative disc disease), lumbar   . Dyslipidemia   . GERD (gastroesophageal reflux disease)   . Hypertension   . Kidney stone   . Obesity   . Sciatica of left side    Past Surgical History:  Procedure Laterality Date  . EYE SURGERY    . TONSILLECTOMY    . VASECTOMY     Current Outpatient Prescriptions on File Prior to Visit  Medication Sig Dispense Refill  . celecoxib (CELEBREX) 200 MG capsule TAKE 1 CAPSULE DAILY 90 capsule 3  . cetirizine (ZYRTEC) 10 MG tablet TAKE 1 TABLET DAILY 90 tablet 3  . cyclobenzaprine (FLEXERIL) 10 MG tablet Take 1 tablet (10 mg total) by mouth 3 (three) times daily as needed for muscle spasms.  30 tablet 0  . escitalopram (LEXAPRO) 10 MG tablet Take 1 tablet (10 mg total) by mouth daily. 90 tablet 1  . fluticasone (FLONASE) 50 MCG/ACT nasal spray Place 2 sprays into both nostrils daily. 48 g 4  . losartan (COZAAR) 100 MG tablet TAKE 1 TABLET DAILY 90 tablet 3  . Melatonin 5 MG TABS Take 1 tablet by mouth daily. For sleep    . Multiple Vitamins-Minerals (ONE-A-DAY MENS HEALTH FORMULA PO) Take 1 tablet by mouth daily.    . OMEGA-3 KRILL OIL 300 MG CAPS Take 1 capsule by mouth daily.    Marland Kitchen omeprazole (PRILOSEC) 40 MG capsule TAKE 1 CAPSULE DAILY 90 capsule 3   No current facility-administered medications on file prior to visit.    Allergies  Allergen Reactions  . Penicillins Anaphylaxis  . Keflex [Cephalexin] Other (See Comments)    Childhood allergy  . Sulfa Antibiotics Other (See Comments)    Childhood allergy   Social History   Social History  . Marital status: Married    Spouse name: N/A  . Number of children: N/A  . Years of education: N/A   Occupational History  . Not on file.   Social History Main Topics  . Smoking status: Never Smoker  . Smokeless tobacco: Never Used  . Alcohol use Yes  Comment: occ  . Drug use: Unknown  . Sexual activity: Not on file   Other Topics Concern  . Not on file   Social History Narrative  . No narrative on file    Past Medical History:  Diagnosis Date  . DDD (degenerative disc disease), lumbar   . Dyslipidemia   . GERD (gastroesophageal reflux disease)   . Hypertension   . Kidney stone   . Obesity   . Sciatica of left side    Past Surgical History:  Procedure Laterality Date  . EYE SURGERY    . TONSILLECTOMY    . VASECTOMY     Current Outpatient Prescriptions on File Prior to Visit  Medication Sig Dispense Refill  . celecoxib (CELEBREX) 200 MG capsule TAKE 1 CAPSULE DAILY 90 capsule 3  . cetirizine (ZYRTEC) 10 MG tablet TAKE 1 TABLET DAILY 90 tablet 3  . cyclobenzaprine (FLEXERIL) 10 MG tablet Take 1 tablet  (10 mg total) by mouth 3 (three) times daily as needed for muscle spasms. 30 tablet 0  . escitalopram (LEXAPRO) 10 MG tablet Take 1 tablet (10 mg total) by mouth daily. 90 tablet 1  . fluticasone (FLONASE) 50 MCG/ACT nasal spray Place 2 sprays into both nostrils daily. 48 g 4  . losartan (COZAAR) 100 MG tablet TAKE 1 TABLET DAILY 90 tablet 3  . Melatonin 5 MG TABS Take 1 tablet by mouth daily. For sleep    . Multiple Vitamins-Minerals (ONE-A-DAY MENS HEALTH FORMULA PO) Take 1 tablet by mouth daily.    . OMEGA-3 KRILL OIL 300 MG CAPS Take 1 capsule by mouth daily.    Marland Kitchen omeprazole (PRILOSEC) 40 MG capsule TAKE 1 CAPSULE DAILY 90 capsule 3   No current facility-administered medications on file prior to visit.    Allergies  Allergen Reactions  . Penicillins Anaphylaxis  . Keflex [Cephalexin] Other (See Comments)    Childhood allergy  . Sulfa Antibiotics Other (See Comments)    Childhood allergy   Social History   Social History  . Marital status: Married    Spouse name: N/A  . Number of children: N/A  . Years of education: N/A   Occupational History  . Not on file.   Social History Main Topics  . Smoking status: Never Smoker  . Smokeless tobacco: Never Used  . Alcohol use Yes     Comment: occ  . Drug use: Unknown  . Sexual activity: Not on file   Other Topics Concern  . Not on file   Social History Narrative  . No narrative on file      Review of Systems  Musculoskeletal: Positive for back pain.  All other systems reviewed and are negative.      Objective:   Physical Exam  Cardiovascular: Normal rate, regular rhythm and normal heart sounds.   No murmur heard. Pulmonary/Chest: Effort normal and breath sounds normal. No respiratory distress. He has no wheezes. He has no rales.  Abdominal: Soft. Bowel sounds are normal.  Musculoskeletal:       Lumbar back: He exhibits decreased range of motion, tenderness, pain and spasm.  Psychiatric: He has a normal mood and  affect. His behavior is normal. Judgment and thought content normal.  Vitals reviewed.         Assessment & Plan:  DDD (degenerative disc disease), lumbar - Plan: DG Lumbar Spine Complete  Snoring - Plan: Split night study  Hypersomnolence - Plan: Split night study  I feel certain, the patient has moderate  to severe obstructive sleep apnea. I'll refer the patient for a sleep study. I believe this will help his fatigue and also his snoring if he were to start wearing CPAP. I know he has degenerative disc disease in his lumbar spine but I believe the majority of the pain and stiffness he describes sounds muscular in nature. I will repeat an x-ray to rule out significant worsening of his degenerative disc disease. If stable, I would recommend physical therapy and also aggressive weight loss. I gave the patient information on Saxenda.

## 2016-02-23 ENCOUNTER — Encounter: Payer: Self-pay | Admitting: Family Medicine

## 2016-02-26 ENCOUNTER — Other Ambulatory Visit: Payer: Self-pay | Admitting: Family Medicine

## 2016-02-26 ENCOUNTER — Encounter: Payer: Self-pay | Admitting: Family Medicine

## 2016-02-26 DIAGNOSIS — G4710 Hypersomnia, unspecified: Secondary | ICD-10-CM

## 2016-02-26 DIAGNOSIS — R0683 Snoring: Secondary | ICD-10-CM

## 2016-02-26 MED ORDER — LIRAGLUTIDE -WEIGHT MANAGEMENT 18 MG/3ML ~~LOC~~ SOPN: 0.6000 mg | PEN_INJECTOR | Freq: Every day | SUBCUTANEOUS | 3 refills | Status: DC

## 2016-02-27 ENCOUNTER — Other Ambulatory Visit: Payer: Self-pay | Admitting: *Deleted

## 2016-02-28 ENCOUNTER — Ambulatory Visit (INDEPENDENT_AMBULATORY_CARE_PROVIDER_SITE_OTHER): Admitting: Neurology

## 2016-02-28 ENCOUNTER — Encounter: Payer: Self-pay | Admitting: Neurology

## 2016-02-28 VITALS — BP 118/72 | HR 92 | Resp 16 | Ht 69.0 in | Wt 262.0 lb

## 2016-02-28 DIAGNOSIS — J301 Allergic rhinitis due to pollen: Secondary | ICD-10-CM | POA: Diagnosis not present

## 2016-02-28 DIAGNOSIS — G4719 Other hypersomnia: Secondary | ICD-10-CM | POA: Diagnosis not present

## 2016-02-28 DIAGNOSIS — G4733 Obstructive sleep apnea (adult) (pediatric): Secondary | ICD-10-CM | POA: Diagnosis not present

## 2016-02-28 NOTE — Patient Instructions (Signed)

## 2016-02-28 NOTE — Progress Notes (Signed)
SLEEP MEDICINE CLINIC   Provider:  Larey Seat, M D  Referring Provider: [name], MD Primary Care Physician:  Odette Fraction, MD  Chief Complaint  Patient presents with  . Sleep Consult    Rm 10. Patient waakes up a couple of times during the night, snores, wakes up feeling tired, occasional morning headache, daytime fatigue, denies taking naps.     HPI:  Zachary Gillespie is a 51 y.o. male , seen here as a referral from Dr. Dennard Schaumann for a sleep consultation and possible sleep study.   Chief complaint according to patient : "My  wife sleeps in a different room because of my snoring"   Zachary Gillespie report that he does not wake up refreshed or restored from his nocturnal sleep. He Yon's as soon as he gets up in the morning and feels that he has not gotten enough sleep. His wife has witnessed him to snore and is actually bothered enough to have moved to a different room. In addition he has to keep physically active and mentally stimulated to not doze off in daytime. 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 His job is physically active.  Dr. Dennard Schaumann wrote that the patient was on a military exercise another soldier stated that his snoring was so bad and recommended he needed to see a doctor. His wife has also witnessed gasping sounds in sleep at night. He has a history of degenerative disc disease lumbar spine confirmed by MRI in 2015 and as his weight has increased his back has gotten worse. He feels stiff when sitting or rising from bed. He has been treated with Celebrex for osteoarthritis, but found no relief.obeisty, HTN, dyslipidemia, GERD, nephrolithiasis ( 2009 ) , sciatia on the  left.    Sleep habits are as follows: He is taking 5 mg of melatonin at night, multivitamin in the morning, fish oil, Prilosec, Flonase as needed Cozaar, Lexapro daily Flexeril as needed and currently not used, Zyrtec for allergies. Zachary Gillespie  usually goes to bed at 10 PM and adheres to this regimen on weekdays and weekends, usually he is asleep very promptly, he prefers to sleep on his side, and sleeps on only one pillow for head support. The bedroom is described as cool, quiet and dark. As mentioned above, his wife is often moving to another room. He does not have nocturia. On occasion he will wake up from a choking sensation likely related to apneic breathing. His wife wakes him up in the morning, he does not wake up spontaneously. This is around 6:30 AM. As reported above he feels not refreshed or restored in the way for more sleep soon after he rises.  Sleep medical history and family sleep history: Both parents allowed snorers but were never formally evaluated for apnea. Maternal grandmother also loud snorer. He has one living sister. He had a tonsillectomy in childhood, no history of traumatic brain injuries, neck injuries, ENT surgeries.  Social history: married, one daughter - 19 working at the [employer].  Non smoker, non ETOH- caffeine ; no longer drinking mountain dew, but  decaffeinated tea , iced tea at lunch, no coffee.  5 hour energy drinks in preparation for a long drive.   Review of Systems: Out of a complete 14 system review, the patient complains of only the following symptoms, and all other reviewed systems are negative.  Snoring, fatigue, anxiety.    Zachary Gillespie endorsed the  Epworth sleepiness score at 13 points fatigue severity at 45 points, he is treated with an antidepressant and would currently not consider himself clinically depressed     Social History   Social History  . Marital status: Married    Spouse name: N/A  . Number of children: 1  . Years of education: college   Occupational History  . Not on file.   Social History Main Topics  . Smoking status: Never Smoker  . Smokeless tobacco: Never Used  . Alcohol use Yes     Comment: occ  . Drug use: No  . Sexual activity: Not on file    Other Topics Concern  . Not on file   Social History Narrative   Drinks about 1 cup of tea a day     Family History  Problem Relation Age of Onset  . Heart disease Father   . Diabetes Father   . Hyperlipidemia Father   . Diabetes Sister   . Heart disease Paternal Grandfather     Past Medical History:  Diagnosis Date  . DDD (degenerative disc disease), lumbar   . Dyslipidemia   . GERD (gastroesophageal reflux disease)   . Hypertension   . Kidney stone   . Obesity   . Sciatica of left side     Past Surgical History:  Procedure Laterality Date  . EYE SURGERY    . TONSILLECTOMY    . VASECTOMY      Current Outpatient Prescriptions  Medication Sig Dispense Refill  . celecoxib (CELEBREX) 200 MG capsule TAKE 1 CAPSULE DAILY 90 capsule 3  . cetirizine (ZYRTEC) 10 MG tablet TAKE 1 TABLET DAILY 90 tablet 3  . escitalopram (LEXAPRO) 10 MG tablet Take 1 tablet (10 mg total) by mouth daily. 90 tablet 1  . fluticasone (FLONASE) 50 MCG/ACT nasal spray Place 2 sprays into both nostrils daily. 48 g 4  . losartan (COZAAR) 100 MG tablet TAKE 1 TABLET DAILY 90 tablet 3  . Melatonin 10 MG TABS Take 10 mg by mouth daily. For sleep     . Multiple Vitamins-Minerals (ONE-A-DAY MENS HEALTH FORMULA PO) Take 1 tablet by mouth daily.    . OMEGA-3 KRILL OIL 300 MG CAPS Take 1 capsule by mouth daily.    Marland Kitchen omeprazole (PRILOSEC) 40 MG capsule TAKE 1 CAPSULE DAILY 90 capsule 3   No current facility-administered medications for this visit.     Allergies as of 02/28/2016 - Review Complete 02/28/2016  Allergen Reaction Noted  . Penicillins Anaphylaxis 04/21/2012  . Keflex [cephalexin] Other (See Comments) 04/21/2012  . Sulfa antibiotics Other (See Comments) 04/21/2012    Vitals: BP 118/72   Pulse 92   Resp 16   Ht 5\' 9"  (1.753 m)   Wt 262 lb (118.8 kg)   BMI 38.69 kg/m  Last Weight:  Wt Readings from Last 1 Encounters:  02/28/16 262 lb (118.8 kg)   [id number] mass index is 38.69 kg/m.      Last Height:   Ht Readings from Last 1 Encounters:  02/28/16 5\' 9"  (1.753 m)    Physical exam:  General: The patient is awake, alert and appears not in acute distress. The patient is well groomed. Head: Normocephalic, atraumatic. Neck is supple. Mallampati  3- low soft [palate, no tonsils. ,  neck circumference:17. 25 . Nasal airflow congestted . Retrognathia is not seen. He has worn braces in childhood. Cardiovascular:  Regular rate and rhythm , without  murmurs or carotid bruit, and without distended neck  veins. Respiratory: Lungs are clear to auscultation. Skin:  Without evidence of edema, or rash Trunk: BMI is 39 The patient's posture is erect    Neurologic exam : The patient is awake and alert, oriented to place and time.    Attention span & concentration ability appears normal.  Speech is fluent,  without  dysarthria, dysphonia or aphasia.  Mood and affect are appropriate.  Cranial nerves: Pupils are equal and briskly reactive to light. Funduscopic exam without evidence of pallor or edema. Extraocular movements  in vertical and horizontal planes intact and without nystagmus. Visual fields by finger perimetry are intact. Hearing to finger rub intact, but has hearing loss,, service related .  Facial sensation intact to fine touch. Facial motor strength is symmetric and tongue and uvula move midline. Shoulder shrug was symmetrical.   Motor exam:  Normal tone, muscle bulk and symmetric strength in all extremities Gait and station: Patient walks without assistive device and is able unassisted to climb up to the exam table. Strength within normal limits.  Stance is stable and normal.  Deep tendon reflexes: in the  upper and lower extremities are symmetric and intact. Babinski maneuver response is downgoing.  The patient was advised of the nature of the diagnosed sleep disorder , the treatment options and risks for general a health and wellness arising from not treating the  condition.  I spent more than 30  minutes of face to face time with the patient. Greater than 50% of time was spent in counseling and coordination of care. We have discussed the diagnosis and differential and I answered the patient's questions.     Assessment:  After physical and neurologic examination, review of laboratory studies,  Personal review of imaging studies, reports of other /same  Imaging studies ,  Results of polysomnography/ neurophysiology testing and pre-existing records as far as provided in visit., my assessment is   1)  Zachary Gillespie has multiple risk factors for obstructive sleep apnea and there has been clinical evidence that apnea is present at this time, his wife and several colleagues have reported so. His risk factor is an elevated body mass index approaching 39, larger than average neck circumference, a small lower jaw is a crowded dental status, and a low hanging soft palate. I would like to add that there is no lateral restriction of his upper airway after tonsillectomy. Currently he suffers from some nasal congestion, seasonal rhinitis that will contribute to him using the oral airway and therefore promotes further snoring. He does have Flonase available at home and he takes Zyrtec. I will invite him for a sleep study to confirm the presence of apnea as well as the degree. If possible I will like him to attend a split-night polysomnography which allows me to diagnose apnea and treat within one night.   Plan:  Treatment plan and additional workup :  SPLIT night PSG for patient on SSRI.      Asencion Partridge Donta Mcinroy MD  02/28/2016   CC: [name], [address] [place], [place] [id number]

## 2016-03-07 ENCOUNTER — Encounter: Payer: Self-pay | Admitting: Family Medicine

## 2016-03-08 MED ORDER — TOPIRAMATE 25 MG PO TABS: ORAL_TABLET | ORAL | 0 refills | Status: DC

## 2016-03-20 ENCOUNTER — Encounter: Payer: Self-pay | Admitting: Family Medicine

## 2016-03-20 ENCOUNTER — Encounter: Payer: Self-pay | Admitting: Neurology

## 2016-03-21 MED ORDER — TOPIRAMATE 50 MG PO TABS: 50.0000 mg | ORAL_TABLET | Freq: Two times a day (BID) | ORAL | 3 refills | Status: DC

## 2016-03-21 NOTE — Telephone Encounter (Signed)
Spoke with patient. He is emailing me a copy of his approval. We have not received anything.

## 2016-04-08 ENCOUNTER — Encounter: Payer: Self-pay | Admitting: Neurology

## 2016-04-22 ENCOUNTER — Encounter: Payer: Self-pay | Admitting: Neurology

## 2016-05-07 ENCOUNTER — Encounter: Payer: Self-pay | Admitting: Neurology

## 2016-05-08 ENCOUNTER — Telehealth: Payer: Self-pay

## 2016-05-08 NOTE — Telephone Encounter (Signed)
Got authorization for in lab study. Spoke to patient and scheduled his sleep study. Explained the delay with waiting for approval, he was very understanding.

## 2016-05-08 NOTE — Telephone Encounter (Signed)
Noted, thank you

## 2016-05-20 ENCOUNTER — Encounter

## 2016-05-20 ENCOUNTER — Ambulatory Visit (INDEPENDENT_AMBULATORY_CARE_PROVIDER_SITE_OTHER): Admitting: Neurology

## 2016-05-20 ENCOUNTER — Encounter: Payer: Self-pay | Admitting: Family Medicine

## 2016-05-20 DIAGNOSIS — G4733 Obstructive sleep apnea (adult) (pediatric): Secondary | ICD-10-CM

## 2016-05-20 DIAGNOSIS — R0683 Snoring: Secondary | ICD-10-CM

## 2016-05-20 DIAGNOSIS — G4719 Other hypersomnia: Secondary | ICD-10-CM

## 2016-05-20 DIAGNOSIS — J301 Allergic rhinitis due to pollen: Secondary | ICD-10-CM

## 2016-05-28 ENCOUNTER — Other Ambulatory Visit: Payer: Self-pay | Admitting: Family Medicine

## 2016-05-29 NOTE — Procedures (Signed)
PATIENT'S NAME:  Zachary, Gillespie DOB:      1965-04-26      MR#:    [id number]     DATE OF RECORDING: 05/20/2016 REFERRING M.D.:  Jenna Luo, MD Study Performed:   Baseline Polysomnogram HISTORY:  [name] reports that he does not wake up refreshed or restored from his nocturnal sleep. He yawns as soon as he gets up in the morning. His wife has witnessed him to snore and is actually bothered enough to have moved to a different room. In addition, he has hypersomnia - he has to keep physically active and mentally stimulated to not doze off in daytime. His wife and colleagues have also witnessed gasping sounds in sleep at night.   He has a history of degenerative disc disease lumbar spine confirmed by MRI in 2015 and as his weight has increased his back has gotten worse. He feels stiff when sitting or rising from bed. He has been treated with Celebrex for osteoarthritis, but found no relief. Morbid Obesity, HTN, dyslipidemia, GERD, nephrolithiasis (2009), sciatica on the left.   The patient endorsed the Epworth Sleepiness Scale at 13/24 points.   The patient's weight 262 pounds with a height of 69 (inches), resulting in a BMI of 38.9 kg/m2. The patient's neck circumference measured 17.2 inches.  CURRENT MEDICATIONS: Celecoxib, Cetirizine, Escitalopram, Fluticasone, Losartan, Melatonin, Multi-Vitamin, Omega-3 and Omeprazole   PROCEDURE:  This is a multichannel digital polysomnogram utilizing the Somnostar 11.2 system.  Electrodes and sensors were applied and monitored per AASM Specifications.   EEG, EOG, Chin and Limb EMG, were sampled at 200 Hz.  ECG, Snore and Nasal Pressure, Thermal Airflow, Respiratory Effort, CPAP Flow and Pressure, Oximetry was sampled at 50 Hz. Digital video and audio were recorded.      BASELINE STUDY; Lights Out was at 22:05 and Lights On at 04:59.  Total recording time (TRT) was 414.5 minutes, with a total sleep time (TST) of 398 minutes.  The patient's sleep latency  was 8.0 minutes. REM latency was 63.0 minutes.  The sleep efficiency was 96.1 %.     SLEEP ARCHITECTURE: WASO (Wake after sleep onset) was 8.5 minutes.  There were 16.5 minutes in Stage N1, 272.5 minutes Stage N2, 19 minutes Stage N3 and 90 minutes in Stage REM.  The percentage of Stage N1 was 4.1%, Stage N2 was 68.5%, Stage N3 was 4.8% and Stage R (REM sleep) was 22.6%.   RESPIRATORY ANALYSIS:  There were a total of 9 respiratory events:  3 obstructive apneas, 0 central apneas and 0 mixed apneas with a total of 3 apneas and an apnea index (AI) of .5 /hour. There were 6 hypopneas with a hypopnea index of 0.9 /hour. The patient also had 0 respiratory event related arousals (RERAs).     The total APNEA/HYPOPNEA INDEX (AHI) was 1.4/hour and the total RESPIRATORY DISTURBANCE INDEX was 1.4 /hour.  9 events occurred in REM sleep and 0 events in NREM. The REM AHI was 6 /hour, versus a non-REM AHI of 0. The patient spent 111.5 minutes of total sleep time in the supine position and 287 minutes in non-supine. The supine AHI was 0.0 versus a non-supine AHI of 1.9.  OXYGEN SATURATION & C02:  The Wake baseline 02 saturation was 96%, with the lowest being 91%. Time spent below 89% saturation equaled 0 minutes.   PERIODIC LIMB MOVEMENTS:   The patient had a total of 6 Periodic Limb Movements.  The arousals were noted as: 47 were spontaneous, 3  were associated with PLMs, and 9 were associated with respiratory events.  Audio and video analysis did not show any abnormal or unusual movements, behaviors, phonations or vocalizations.  The patient did not take bathroom breaks. Snoring was only noted in supine sleep. EKG was in keeping with normal sinus rhythm (NSR).  Post-study, the patient indicated that sleep was the same as usual.   IMPRESSION: There was no clinically significant degree of sleep apnea, PLMs or hypoxemia noted.  Sleep efficiency was high. Snoring ( only present in supine ) was moderate.     RECOMMENDATIONS:  1. Snoring treatment can be achieved by dental device, weight loss and avoiding supine sleep position. 2. A follow up appointment will be scheduled in the Sleep Clinic at [facility]. The referring provider will be notified of the results.      I certify that I have reviewed the entire raw data recording prior to the issuance of this report in accordance with the Standards of Accreditation of the American Academy of Sleep Medicine (AASM)      Larey Seat, MD  05-29-2016  Diplomat, American Board of Psychiatry and Neurology  Diplomat, [employer] Director, [place] [facility] [employer]

## 2016-05-30 ENCOUNTER — Telehealth: Payer: Self-pay

## 2016-05-30 NOTE — Telephone Encounter (Signed)
-----   Message from Larey Seat, MD sent at 05/29/2016  6:10 PM EDT ----- To my surprise- no apnea ! Snoring is noted, otherwise good , sustained sleep with normal sleep architecture. Recommendations include avoiding supine sleep, Dental device and  Weight loss.

## 2016-05-30 NOTE — Telephone Encounter (Signed)
I called pt. I advised him that surprisingly, his sleep study did not show any sleep apnea, just snoring, and pt had normal sleep architecture. I advised pt to avoid supine sleep, consider a dental device for snoring, and pursue weight loss. Pt says that he will speak to his own dentist regarding a dental device for snoring and is working on weight loss. I asked pt if he would like an appt with Dr. Brett Fairy to discuss sleepiness but pt declined, and said that he would call us back in a couple months if the weight loss did not improve his sleepiness. Pt asked me to send a copy of his sleep study to Dr. Dennard Schaumann. Pt verbalized understanding of results. Pt had no questions at this time but was encouraged to call back if questions arise.

## 2016-08-10 ENCOUNTER — Other Ambulatory Visit: Payer: Self-pay | Admitting: Family Medicine

## 2016-08-21 ENCOUNTER — Other Ambulatory Visit: Payer: Self-pay | Admitting: Family Medicine

## 2016-08-21 DIAGNOSIS — I10 Essential (primary) hypertension: Secondary | ICD-10-CM

## 2016-09-19 ENCOUNTER — Other Ambulatory Visit: Payer: Self-pay | Admitting: Family Medicine

## 2016-10-01 ENCOUNTER — Other Ambulatory Visit: Payer: Self-pay | Admitting: Family Medicine

## 2016-11-07 ENCOUNTER — Other Ambulatory Visit: Payer: Self-pay | Admitting: Family Medicine

## 2016-11-07 DIAGNOSIS — Z9109 Other allergy status, other than to drugs and biological substances: Secondary | ICD-10-CM

## 2016-12-30 ENCOUNTER — Other Ambulatory Visit: Payer: Self-pay | Admitting: Family Medicine

## 2017-05-19 ENCOUNTER — Encounter: Payer: Self-pay | Admitting: Family Medicine

## 2017-05-19 ENCOUNTER — Ambulatory Visit (INDEPENDENT_AMBULATORY_CARE_PROVIDER_SITE_OTHER): Admitting: Family Medicine

## 2017-05-19 VITALS — BP 148/80 | HR 84 | Temp 98.1°F | Resp 18 | Ht 69.0 in | Wt 275.0 lb

## 2017-05-19 DIAGNOSIS — Z125 Encounter for screening for malignant neoplasm of prostate: Secondary | ICD-10-CM

## 2017-05-19 DIAGNOSIS — I10 Essential (primary) hypertension: Secondary | ICD-10-CM

## 2017-05-19 DIAGNOSIS — Z1211 Encounter for screening for malignant neoplasm of colon: Secondary | ICD-10-CM

## 2017-05-19 DIAGNOSIS — M5136 Other intervertebral disc degeneration, lumbar region: Secondary | ICD-10-CM

## 2017-05-19 DIAGNOSIS — R1031 Right lower quadrant pain: Secondary | ICD-10-CM

## 2017-05-19 LAB — URINALYSIS, ROUTINE W REFLEX MICROSCOPIC
Bilirubin Urine: NEGATIVE
Glucose, UA: NEGATIVE
Hgb urine dipstick: NEGATIVE
KETONES UR: NEGATIVE
LEUKOCYTES UA: NEGATIVE
Nitrite: NEGATIVE
PH: 7.5 (ref 5.0–8.0)
Protein, ur: NEGATIVE
SPECIFIC GRAVITY, URINE: 1.02 (ref 1.001–1.03)

## 2017-05-19 MED ORDER — HYDROCHLOROTHIAZIDE 25 MG PO TABS: 25.0000 mg | ORAL_TABLET | Freq: Every day | ORAL | 3 refills | Status: DC

## 2017-05-19 NOTE — Progress Notes (Signed)
Subjective:     Patient ID: Zachary Gillespie, male   DOB: 1965-08-09, 52 y.o.   MRN: [id number]  HPI  Patient has several concerns.  He has a history of lumbar degenerative disc disease.  He has episodic low back pain likely exacerbated by his elevated BMI.  He has been unsuccessful in losing weight.  He tends to have pain with prolonged standing for more than 20 minutes.  He has a difficult time walking more than 2 to 3 miles with his job in [employer].  He is unable to lift heavy weight or perform sit ups due to pain in his back.  Over the last few weeks, he has had pain that radiates from his lower back down into his right inguinal canal to his scrotum.  He has not appreciated any bulge in his scrotum or bulge in the inguinal canal.  However the pain is made worse by heavy lifting or Valsalva.  On exam today, I can appreciate no testicular mass.  I can appreciate no direct or indirect inguinal hernia.  He denies any numbness or tingling radiating around from his back to his scrotum.  He denies any hematuria or dysuria.  Blood pressure is elevated today at 148/80.  He denies any chest pain shortness of breath or dyspnea on exertion.  He is 52 years old and therefore he is due for a colonoscopy along with prostate cancer screening Past Medical History:  Diagnosis Date  . DDD (degenerative disc disease), lumbar   . Dyslipidemia   . GERD (gastroesophageal reflux disease)   . Hypertension   . Kidney stone   . Obesity   . Sciatica of left side    Past Surgical History:  Procedure Laterality Date  . EYE SURGERY    . TONSILLECTOMY    . VASECTOMY     Current Outpatient Medications on File Prior to Visit  Medication Sig Dispense Refill  . celecoxib (CELEBREX) 200 MG capsule TAKE 1 CAPSULE DAILY 90 capsule 3  . cetirizine (ZYRTEC) 10 MG tablet TAKE 1 TABLET DAILY 90 tablet 3  . escitalopram (LEXAPRO) 10 MG tablet TAKE 1 TABLET DAILY 90 tablet 3  . fluticasone (FLONASE) 50 MCG/ACT nasal spray USE 2  SPRAYS IN EACH NOSTRIL DAILY 48 g 4  . losartan (COZAAR) 100 MG tablet TAKE 1 TABLET DAILY 90 tablet 3  . Melatonin 10 MG TABS Take 10 mg by mouth daily. For sleep     . Multiple Vitamins-Minerals (ONE-A-DAY MENS HEALTH FORMULA PO) Take 1 tablet by mouth daily.    . OMEGA-3 KRILL OIL 300 MG CAPS Take 1 capsule by mouth daily.    Marland Kitchen omeprazole (PRILOSEC) 40 MG capsule TAKE 1 CAPSULE DAILY 90 capsule 3  . topiramate (TOPAMAX) 50 MG tablet TAKE 1 TABLET BY MOUTH TWICE DAILY 60 tablet 3   No current facility-administered medications on file prior to visit.    Allergies  Allergen Reactions  . Penicillins Anaphylaxis  . Keflex [Cephalexin] Other (See Comments)    Childhood allergy  . Sulfa Antibiotics Other (See Comments)    Childhood allergy   Social History   Socioeconomic History  . Marital status: Married    Spouse name: Not on file  . Number of children: 1  . Years of education: college  . Highest education level: Not on file  Occupational History  . Not on file  Social Needs  . Financial resource strain: Not on file  . Food insecurity:    Worry: Not  on file    Inability: Not on file  . Transportation needs:    Medical: Not on file    Non-medical: Not on file  Tobacco Use  . Smoking status: Never Smoker  . Smokeless tobacco: Never Used  Substance and Sexual Activity  . Alcohol use: Yes    Comment: occ  . Drug use: No  . Sexual activity: Not on file  Lifestyle  . Physical activity:    Days per week: Not on file    Minutes per session: Not on file  . Stress: Not on file  Relationships  . Social connections:    Talks on phone: Not on file    Gets together: Not on file    Attends religious service: Not on file    Active member of club or organization: Not on file    Attends meetings of clubs or organizations: Not on file    Relationship status: Not on file  . Intimate partner violence:    Fear of current or ex partner: Not on file    Emotionally abused: Not on  file    Physically abused: Not on file    Forced sexual activity: Not on file  Other Topics Concern  . Not on file  Social History Narrative   Drinks about 1 cup of tea a day      Review of Systems  All other systems reviewed and are negative.      Objective:   Physical Exam  Constitutional: He appears well-developed and well-nourished.  Cardiovascular: Normal rate, regular rhythm and normal heart sounds.  Pulmonary/Chest: Effort normal and breath sounds normal. No stridor. No respiratory distress. He has no wheezes. He has no rales.  Abdominal: Soft. Bowel sounds are normal. He exhibits no distension and no mass. There is no tenderness. There is no rebound and no guarding.    Musculoskeletal:       Lumbar back: He exhibits decreased range of motion and pain.  Vitals reviewed.      Assessment:     DDD (degenerative disc disease), lumbar - Plan: Urinalysis, Routine w reflex microscopic  Colon cancer screening - Plan: Ambulatory referral to Gastroenterology  Benign essential HTN - Plan: CBC with Differential/Platelet, COMPLETE METABOLIC PANEL WITH GFR, Lipid panel, hydrochlorothiazide (HYDRODIURIL) 25 MG tablet  Prostate cancer screening - Plan: PSA  RLQ abdominal pain   Plan:     Recommended starting hydrochlorothiazide 25 mg a day for hypertension and rechecking blood pressure in 1 month.  While the patient is here, I would like to obtain a CBC, CMP, and a fasting lipid panel.  I have recommended 30 pounds weight loss.  We discussed Saxenda as a means to achieve weight loss in addition to diet and exercise and lifestyle changes which so far the patient has been unsuccessful with a line.  Patient will check on insurance coverage prior to me prescribing that.  I will screen the patient for prostate cancer with a PSA.  I will also schedule him for a colonoscopy.  I am uncertain of the cause of his right lower quadrant pain.  Differential diagnosis includes inguinal hernia,  kidney stone, neuropathic pain referred from the lumbar spine, muscle strain.  Exam today is normal.  Therefore I will obtain a urinalysis.  If there is hematuria, I will obtain a CT urogram.  If there is no urinalysis, I would recommend a general surgery consultation to evaluate for inguinal hernia.  I am unable to appreciated on exam.  They may require imaging to evaluate further but I will leave that to their discretion.  Pain is gone on for 3 to 4 weeks and history sounds most consistent with inguinal hernia

## 2017-05-20 ENCOUNTER — Other Ambulatory Visit: Payer: Self-pay | Admitting: Family Medicine

## 2017-05-20 ENCOUNTER — Encounter: Payer: Self-pay | Admitting: Family Medicine

## 2017-05-20 DIAGNOSIS — E785 Hyperlipidemia, unspecified: Secondary | ICD-10-CM | POA: Insufficient documentation

## 2017-05-20 DIAGNOSIS — K4090 Unilateral inguinal hernia, without obstruction or gangrene, not specified as recurrent: Secondary | ICD-10-CM

## 2017-05-20 DIAGNOSIS — R7303 Prediabetes: Secondary | ICD-10-CM | POA: Insufficient documentation

## 2017-05-21 ENCOUNTER — Encounter: Payer: Self-pay | Admitting: Family Medicine

## 2017-05-21 LAB — CBC WITH DIFFERENTIAL/PLATELET
BASOS PCT: 1.1 %
Basophils Absolute: 73 cells/uL (ref 0–200)
Eosinophils Absolute: 337 cells/uL (ref 15–500)
Eosinophils Relative: 5.1 %
HCT: 41.4 % (ref 38.5–50.0)
HEMOGLOBIN: 13.9 g/dL (ref 13.2–17.1)
Lymphs Abs: 1762 cells/uL (ref 850–3900)
MCH: 28.4 pg (ref 27.0–33.0)
MCHC: 33.6 g/dL (ref 32.0–36.0)
MCV: 84.5 fL (ref 80.0–100.0)
MONOS PCT: 5.9 %
MPV: 10.7 fL (ref 7.5–12.5)
Neutro Abs: 4039 cells/uL (ref 1500–7800)
Neutrophils Relative %: 61.2 %
PLATELETS: 328 10*3/uL (ref 140–400)
RBC: 4.9 10*6/uL (ref 4.20–5.80)
RDW: 13.3 % (ref 11.0–15.0)
TOTAL LYMPHOCYTE: 26.7 %
WBC: 6.6 10*3/uL (ref 3.8–10.8)
WBCMIX: 389 {cells}/uL (ref 200–950)

## 2017-05-21 LAB — COMPLETE METABOLIC PANEL WITH GFR
AG Ratio: 1.9 (calc) (ref 1.0–2.5)
ALT: 26 U/L (ref 9–46)
AST: 19 U/L (ref 10–35)
Albumin: 4.4 g/dL (ref 3.6–5.1)
Alkaline phosphatase (APISO): 69 U/L (ref 40–115)
BUN: 13 mg/dL (ref 7–25)
CALCIUM: 9.4 mg/dL (ref 8.6–10.3)
CO2: 26 mmol/L (ref 20–32)
CREATININE: 0.86 mg/dL (ref 0.70–1.33)
Chloride: 105 mmol/L (ref 98–110)
GFR, EST AFRICAN AMERICAN: 117 mL/min/{1.73_m2} (ref >=60)
GFR, EST NON AFRICAN AMERICAN: 101 mL/min/{1.73_m2} (ref >=60)
GLUCOSE: 120 mg/dL — AB (ref 65–99)
Globulin: 2.3 g/dL (calc) (ref 1.9–3.7)
Potassium: 5 mmol/L (ref 3.5–5.3)
Sodium: 139 mmol/L (ref 135–146)
TOTAL PROTEIN: 6.7 g/dL (ref 6.1–8.1)
Total Bilirubin: 0.4 mg/dL (ref 0.2–1.2)

## 2017-05-21 LAB — TEST AUTHORIZATION

## 2017-05-21 LAB — LIPID PANEL
CHOL/HDL RATIO: 4.4 (calc) (ref <5.0)
Cholesterol: 173 mg/dL (ref <200)
HDL: 39 mg/dL — ABNORMAL LOW (ref >40)
LDL CHOLESTEROL (CALC): 110 mg/dL — AB
NON-HDL CHOLESTEROL (CALC): 134 mg/dL — AB (ref <130)
TRIGLYCERIDES: 125 mg/dL (ref <150)

## 2017-05-21 LAB — PSA: PSA: 0.8 ng/mL (ref <=4.0)

## 2017-05-21 LAB — HEMOGLOBIN A1C W/OUT EAG: HEMOGLOBIN A1C: 6.3 %{Hb} — AB (ref <5.7)

## 2017-05-22 ENCOUNTER — Telehealth: Payer: Self-pay | Admitting: *Deleted

## 2017-05-22 MED ORDER — LIRAGLUTIDE -WEIGHT MANAGEMENT 18 MG/3ML ~~LOC~~ SOPN: 1.8000 mg | PEN_INJECTOR | Freq: Every day | SUBCUTANEOUS | 2 refills | Status: DC

## 2017-05-22 MED ORDER — ATORVASTATIN CALCIUM 20 MG PO TABS: 20.0000 mg | ORAL_TABLET | Freq: Every day | ORAL | 3 refills | Status: DC

## 2017-05-22 NOTE — Telephone Encounter (Signed)
Received request from pharmacy for [place] on Saxenda.   PA submitted via telephone.   Dx: E66.09- [id number].   Of note, [employer] only covers 60/ 60 days supply for mail order.

## 2017-05-23 ENCOUNTER — Other Ambulatory Visit: Payer: Self-pay | Admitting: Family Medicine

## 2017-05-28 NOTE — Telephone Encounter (Signed)
Received call from [place].   Medical Necessity approved. Medication still requires PA.   PA submitted.

## 2017-05-30 MED ORDER — INSULIN PEN NEEDLE 32G X 6 MM MISC: 1 refills | Status: DC

## 2017-05-30 MED ORDER — LIRAGLUTIDE -WEIGHT MANAGEMENT 18 MG/3ML ~~LOC~~ SOPN: 0.6000 mg | PEN_INJECTOR | Freq: Every day | SUBCUTANEOUS | 2 refills | Status: DC

## 2017-05-30 NOTE — Telephone Encounter (Signed)
Received PA determination.   PA approved through 09/25/2017.  Call placed to patient and patient made aware.   Pharmacy made aware.

## 2017-06-05 ENCOUNTER — Encounter: Payer: Self-pay | Admitting: Family Medicine

## 2017-06-24 ENCOUNTER — Encounter: Payer: Self-pay | Admitting: Family Medicine

## 2017-06-30 ENCOUNTER — Ambulatory Visit (INDEPENDENT_AMBULATORY_CARE_PROVIDER_SITE_OTHER): Admitting: Surgery

## 2017-06-30 ENCOUNTER — Encounter: Payer: Self-pay | Admitting: Surgery

## 2017-06-30 VITALS — BP 124/84 | HR 88 | Temp 97.8°F | Ht 69.0 in | Wt 264.0 lb

## 2017-06-30 DIAGNOSIS — K4090 Unilateral inguinal hernia, without obstruction or gangrene, not specified as recurrent: Secondary | ICD-10-CM

## 2017-06-30 NOTE — Progress Notes (Signed)
Zachary Gillespie is an 52 y.o. male.   Chief Complaint: Inguinal hernia Consult requested by [facility] HPI: This is a patient with an inguinal hernia. Rt groin pain and no bulge for several months. Has had a vasectomy. Colonoscopy scheduled this week. Patient describes several weeks to months of right groin pain but no bulge.  He has no nausea vomiting fevers or chills.  He has some testicular pain but points to the groin is a site of most of his pain.  No family history of colon cancer.  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  Past Medical History:  Diagnosis Date  . DDD (degenerative disc disease), lumbar   . Dyslipidemia   . GERD (gastroesophageal reflux disease)   . HLD (hyperlipidemia)   . Hypertension   . Kidney stone   . Obesity   . Prediabetes   . Sciatica of left side     Past Surgical History:  Procedure Laterality Date  . EYE SURGERY    . TONSILLECTOMY    . VASECTOMY      Family History  Problem Relation Age of Onset  . Heart disease Father   . Diabetes Father   . Hyperlipidemia Father   . Diabetes Sister   . Heart disease Paternal Grandfather    Social History:  reports that he has never smoked. He has never used smokeless tobacco. He reports that he drinks alcohol. He reports that he does not use drugs.  Allergies:  Allergies  Allergen Reactions  . Penicillins Anaphylaxis  . Keflex [Cephalexin] Other (See Comments)    Childhood allergy  . Sulfa Antibiotics Other (See Comments)    Childhood allergy     (Not in a hospital admission)   Review of Systems:   Review of Systems  Constitutional: Negative.   HENT: Negative.   Eyes: Negative.   Respiratory: Negative.   Cardiovascular: Negative.   Gastrointestinal: Positive for heartburn. Negative for abdominal pain, blood in stool, constipation, diarrhea, nausea and vomiting.  Genitourinary: Negative.   Musculoskeletal: Negative.   Skin: Negative.   Neurological: Negative.   Endo/Heme/Allergies: Negative.    Psychiatric/Behavioral: Negative.     Physical Exam:  Physical Exam  Constitutional: He is oriented to person, place, and time. He appears well-developed and well-nourished. No distress.  HENT:  Head: Normocephalic and atraumatic.  Eyes: Pupils are equal, round, and reactive to light. Conjunctivae and EOM are normal. Right eye exhibits no discharge. Left eye exhibits no discharge. No scleral icterus.  Neck: Normal range of motion. Neck supple.  Cardiovascular: Normal heart sounds. Exam reveals no friction rub.  No murmur heard. Pulmonary/Chest: Effort normal and breath sounds normal. No stridor. No respiratory distress. He has no wheezes.  Abdominal: Soft. He exhibits no distension and no mass. There is no tenderness. There is no guarding.  Genitourinary: Penis normal.  Genitourinary Comments: Patient examined standing supine and with Valsalva.  Small right inguinal hernia which is reducible and minimally tender at the internal ring.  Normal testicles no left inguinal hernia  Musculoskeletal: Normal range of motion. He exhibits no edema or deformity.  Neurological: He is alert and oriented to person, place, and time.  Skin: Skin is warm and dry. He is not diaphoretic.  Psychiatric: He has a normal mood and affect. Judgment normal.  Vitals reviewed.   There were no vitals taken for this visit.    No results found for this or any previous visit (from the past 48 hour(s)). No results found.   Assessment/Plan  This is a patient with a right inguinal hernia.  It is symptomatic.  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  I discussed with him the procedure itself and the rationale for offering laparoscopic inguinal hernia repair on the right.  I also discussed the risk of bleeding infection recurrence ischemic orchitis chronic pain syndrome and neuroma he understood and agreed to proceed.  [name], MD, FACS

## 2017-06-30 NOTE — Patient Instructions (Addendum)
You have chose to have your hernia repaired. This will be done by Dr. Burt Knack on 07/18/17 at [facility].  Please see your (blue) Pre-care information that you have been given today.  You will need to arrange to be out of work for 2 weeks and then return with a lifting restrictions for 4 more weeks. Please send any FMLA paperwork prior to surgery and we will fill this out and fax it back to your employer within 3 business days.  You may have a bruise in your groin and also swelling and brusing in your testicle area. You may use ice 4-5 times daily for 15-20 minutes each time. Make sure that you place a barrier between you and the ice pack. To decrease the swelling, you may roll up a bath towel and place it vertically in between your thighs with your testicles resting on the towel. You will want to keep this area elevated as much as possible for several days following surgery.    Inguinal Hernia, Adult Muscles help keep everything in the body in its proper place. But if a weak spot in the muscles develops, something can poke through. That is called a hernia. When this happens in the lower part of the belly (abdomen), it is called an inguinal hernia. (It takes its name from a part of the body in this region called the inguinal canal.) A weak spot in the wall of muscles lets some fat or part of the small intestine bulge through. An inguinal hernia can develop at any age. Men get them more often than women. CAUSES  In adults, an inguinal hernia develops over time.  It can be triggered by:  Suddenly straining the muscles of the lower abdomen.  Lifting heavy objects.  Straining to have a bowel movement. Difficult bowel movements (constipation) can lead to this.  Constant coughing. This may be caused by smoking or lung disease.  Being overweight.  Being pregnant.  Working at a job that requires long periods of standing or heavy lifting.  Having had an inguinal hernia before. One type can be an  emergency situation. It is called a strangulated inguinal hernia. It develops if part of the small intestine slips through the weak spot and cannot get back into the abdomen. The blood supply can be cut off. If that happens, part of the intestine may die. This situation requires emergency surgery. SYMPTOMS  Often, a small inguinal hernia has no symptoms. It is found when a healthcare provider does a physical exam. Larger hernias usually have symptoms.   In adults, symptoms may include:  A lump in the groin. This is easier to see when the person is standing. It might disappear when lying down.  In men, a lump in the scrotum.  Pain or burning in the groin. This occurs especially when lifting, straining or coughing.  A dull ache or feeling of pressure in the groin.  Signs of a strangulated hernia can include:  A bulge in the groin that becomes very painful and tender to the touch.  A bulge that turns red or purple.  Fever, nausea and vomiting.  Inability to have a bowel movement or to pass gas. DIAGNOSIS  To decide if you have an inguinal hernia, a healthcare provider will probably do a physical examination.  This will include asking questions about any symptoms you have noticed.  The healthcare provider might feel the groin area and ask you to cough. If an inguinal hernia is felt, the healthcare provider  may try to slide it back into the abdomen.  Usually no other tests are needed. TREATMENT  Treatments can vary. The size of the hernia makes a difference. Options include:  Watchful waiting. This is often suggested if the hernia is small and you have had no symptoms.  No medical procedure will be done unless symptoms develop.  You will need to watch closely for symptoms. If any occur, contact your healthcare provider right away.  Surgery. This is used if the hernia is larger or you have symptoms.  Open surgery. This is usually an outpatient procedure (you will not stay  overnight in a hospital). An cut (incision) is made through the skin in the groin. The hernia is put back inside the abdomen. The weak area in the muscles is then repaired by herniorrhaphy or hernioplasty. Herniorrhaphy: in this type of surgery, the weak muscles are sewn back together. Hernioplasty: a patch or mesh is used to close the weak area in the abdominal wall.  Laparoscopy. In this procedure, a surgeon makes small incisions. A thin tube with a tiny video camera (called a laparoscope) is put into the abdomen. The surgeon repairs the hernia with mesh by looking with the video camera and using two long instruments. HOME CARE INSTRUCTIONS   After surgery to repair an inguinal hernia:  You will need to take pain medicine prescribed by your healthcare provider. Follow all directions carefully.  You will need to take care of the wound from the incision.  Your activity will be restricted for awhile. This will probably include no heavy lifting for several weeks. You also should not do anything too active for a few weeks. When you can return to work will depend on the type of job that you have.  During "watchful waiting" periods, you should:  Maintain a healthy weight.  Eat a diet high in fiber (fruits, vegetables and whole grains).  Drink plenty of fluids to avoid constipation. This means drinking enough water and other liquids to keep your urine clear or pale yellow.  Do not lift heavy objects.  Do not stand for long periods of time.  Quit smoking. This should keep you from developing a frequent cough. SEEK MEDICAL CARE IF:   A bulge develops in your groin area.  You feel pain, a burning sensation or pressure in the groin. This might be worse if you are lifting or straining.  You develop a fever of more than 100.5 F (38.1 C). SEEK IMMEDIATE MEDICAL CARE IF:   Pain in the groin increases suddenly.  A bulge in the groin gets bigger suddenly and does not go down.  For men, there  is sudden pain in the scrotum. Or, the size of the scrotum increases.  A bulge in the groin area becomes red or purple and is painful to touch.  You have nausea or vomiting that does not go away.  You feel your heart beating much faster than normal.  You cannot have a bowel movement or pass gas.  You develop a fever of more than 102.0 F (38.9 C).   This information is not intended to replace advice given to you by your health care provider. Make sure you discuss any questions you have with your health care provider.   Document Released: 05/12/2008 Document Revised: 03/18/2011 Document Reviewed: 06/27/2014 Elsevier Interactive Patient Education [employer].

## 2017-07-01 ENCOUNTER — Telehealth: Payer: Self-pay | Admitting: Surgery

## 2017-07-01 NOTE — Telephone Encounter (Signed)
Pt advised of pre op date/time and sx date. Sx: 07/18/17 with Dr Maeola Sarah right inguinal hernia repair.  Pre op: 07/11/17 between 9-1:00pm--phone interview.   Patient made aware to call [phone], between 1-3:00pm the day before surgery, to find out what time to arrive.

## 2017-07-03 LAB — HM COLONOSCOPY

## 2017-07-09 ENCOUNTER — Encounter: Payer: Self-pay | Admitting: *Deleted

## 2017-07-09 ENCOUNTER — Encounter: Payer: Self-pay | Admitting: Family Medicine

## 2017-07-11 ENCOUNTER — Other Ambulatory Visit: Payer: Self-pay

## 2017-07-11 ENCOUNTER — Encounter
Admission: RE | Admit: 2017-07-11 | Discharge: 2017-07-11 | Disposition: A | Discharge status: Home / Self Care | Source: Ambulatory Visit | Attending: Surgery | Admitting: Surgery

## 2017-07-11 HISTORY — DX: Personal history of other diseases of the digestive system: Z87.19

## 2017-07-11 HISTORY — DX: Type 2 diabetes mellitus without complications: E11.9

## 2017-07-11 HISTORY — DX: Personal history of urinary calculi: Z87.442

## 2017-07-11 NOTE — Patient Instructions (Addendum)
Your procedure is scheduled on: 07-18-17 FRIDAY Report to Same Day Surgery 2nd floor medical mall [facility] Entrance-take elevator on left to 2nd floor.  Check in with surgery information desk.) To find out your arrival time please call [phone] between 1PM - 3PM on 07-17-17 THURSDAY  Remember: Instructions that are not followed completely may result in serious medical risk, up to and including death, or upon the discretion of your surgeon and anesthesiologist your surgery may need to be rescheduled.    _x___ 1. Do not eat food after midnight the night before your procedure. NO GUM OR CANDY AFTER MIDNIGHT. You may drink WATER up to 2 hours before you are scheduled to arrive at the hospital for your procedure.  Do not drink WATER within 2 hours of your scheduled arrival to the hospital.  Type 1 and type 2 diabetics should only drink water.     __x__ 2. No Alcohol for 24 hours before or after surgery.   __x__3. No Smoking or e-cigarettes for 24 prior to surgery.  Do not use any chewable tobacco products for at least 6 hour prior to surgery   ____  4. Bring all medications with you on the day of surgery if instructed.    __x__ 5. Notify your doctor if there is any change in your medical condition     (cold, fever, infections).    x___6. On the morning of surgery brush your teeth with toothpaste and water.  You may rinse your mouth with mouth wash if you wish.  Do not swallow any toothpaste or mouthwash.   Do not wear jewelry, make-up, hairpins, clips or nail polish.  Do not wear lotions, powders, or perfumes. You may wear deodorant.  Do not shave 48 hours prior to surgery. Men may shave face and neck.  Do not bring valuables to the hospital.    [facility] is not responsible for any belongings or valuables.               Contacts, dentures or bridgework may not be worn into surgery.  Leave your suitcase in the car. After surgery it may be brought to your room.  For patients  admitted to the hospital, discharge time is determined by your treatment team.  _  Patients discharged the day of surgery will not be allowed to drive home.  You will need someone to drive you home and stay with you the night of your procedure.    Please read over the following fact sheets that you were given:   [facility] Preparing for Surgery   _x___ TAKE THE FOLLOWING MEDICATION THE MORNING OF SURGERY WITH A SMALL SIP OF WATER. These include:  1. LIPITOR (ATORVASTATIN)  2. CELEXA (ESCITALOPRAM)  3. PRILOSEC (OMEPRAZOLE)  4. TAKE AN EXTRA PRILOSEC THE NIGHT BEFORE YOUR SURGERY  5.  6.  ____Fleets enema or Magnesium Citrate as directed.   _x___ Use CHG Soap or sage wipes as directed on instruction sheet   ____ Use inhalers on the day of surgery and bring to hospital day of surgery  ____ Stop Metformin and Janumet 2 days prior to surgery.    ____ Take 1/2 of usual insulin dose the night before surgery and none on the morning surgery.   ____ Follow recommendations from Cardiologist, Pulmonologist or PCP regarding stopping Aspirin, Coumadin, Plavix ,Eliquis, Effient, or Pradaxa, and Pletal.  X____Stop Anti-inflammatories such as Advil, Aleve, Ibuprofen, Motrin, Naproxen, Naprosyn, Goodies powders or aspirin products NOW-OK to take Tylenol  _x___ Stop supplements until after surgery-STOP MELATONIN AND OMEGA 3-KRILL OIL NOW-MAY RESUME AFTER SURGERY   ____ Bring C-Pap to the hospital.

## 2017-07-14 ENCOUNTER — Emergency Department (HOSPITAL_COMMUNITY)
Admission: EM | Admit: 2017-07-14 | Discharge: 2017-07-14 | Disposition: A | Discharge status: Home / Self Care | Attending: Emergency Medicine | Admitting: Emergency Medicine

## 2017-07-14 ENCOUNTER — Other Ambulatory Visit: Payer: Self-pay

## 2017-07-14 ENCOUNTER — Encounter: Payer: Self-pay | Admitting: Family Medicine

## 2017-07-14 ENCOUNTER — Encounter (HOSPITAL_COMMUNITY): Payer: Self-pay | Admitting: Emergency Medicine

## 2017-07-14 ENCOUNTER — Emergency Department (HOSPITAL_COMMUNITY)

## 2017-07-14 DIAGNOSIS — Z79899 Other long term (current) drug therapy: Secondary | ICD-10-CM | POA: Diagnosis not present

## 2017-07-14 DIAGNOSIS — I10 Essential (primary) hypertension: Secondary | ICD-10-CM | POA: Diagnosis not present

## 2017-07-14 DIAGNOSIS — E785 Hyperlipidemia, unspecified: Secondary | ICD-10-CM | POA: Diagnosis not present

## 2017-07-14 DIAGNOSIS — N200 Calculus of kidney: Secondary | ICD-10-CM | POA: Insufficient documentation

## 2017-07-14 DIAGNOSIS — R319 Hematuria, unspecified: Secondary | ICD-10-CM | POA: Diagnosis not present

## 2017-07-14 DIAGNOSIS — E119 Type 2 diabetes mellitus without complications: Secondary | ICD-10-CM | POA: Insufficient documentation

## 2017-07-14 DIAGNOSIS — R1084 Generalized abdominal pain: Secondary | ICD-10-CM | POA: Diagnosis present

## 2017-07-14 LAB — COMPREHENSIVE METABOLIC PANEL
ALT: 41 U/L (ref 0–44)
AST: 26 U/L (ref 15–41)
Albumin: 4.3 g/dL (ref 3.5–5.0)
Alkaline Phosphatase: 95 U/L (ref 38–126)
Anion gap: 10 (ref 5–15)
BUN: 10 mg/dL (ref 6–20)
CO2: 29 mmol/L (ref 22–32)
Calcium: 9.8 mg/dL (ref 8.9–10.3)
Chloride: 103 mmol/L (ref 98–111)
Creatinine, Ser: 0.98 mg/dL (ref 0.61–1.24)
GFR calc Af Amer: >60 mL/min (ref >60)
GFR calc non Af Amer: >60 mL/min (ref >60)
Glucose, Bld: 110 mg/dL — ABNORMAL HIGH (ref 70–99)
Potassium: 4.1 mmol/L (ref 3.5–5.1)
Sodium: 142 mmol/L (ref 135–145)
Total Bilirubin: 0.5 mg/dL (ref 0.3–1.2)
Total Protein: 7.3 g/dL (ref 6.5–8.1)

## 2017-07-14 LAB — URINALYSIS, ROUTINE W REFLEX MICROSCOPIC
Bilirubin Urine: NEGATIVE
Glucose, UA: NEGATIVE mg/dL
Ketones, ur: NEGATIVE mg/dL
LEUKOCYTES UA: NEGATIVE
Nitrite: NEGATIVE
PROTEIN: 30 mg/dL — AB
RBC / HPF: >50 RBC/hpf — ABNORMAL HIGH (ref 0–5)
SPECIFIC GRAVITY, URINE: 1.029 (ref 1.005–1.030)
pH: 5 (ref 5.0–8.0)

## 2017-07-14 LAB — CBC WITH DIFFERENTIAL/PLATELET
Abs Immature Granulocytes: 0 10*3/uL (ref 0.0–0.1)
Basophils Absolute: 0.1 10*3/uL (ref 0.0–0.1)
Basophils Relative: 1 %
Eosinophils Absolute: 0.2 10*3/uL (ref 0.0–0.7)
Eosinophils Relative: 2 %
HCT: 42.7 % (ref 39.0–52.0)
Hemoglobin: 13.5 g/dL (ref 13.0–17.0)
Immature Granulocytes: 0 %
Lymphocytes Relative: 20 %
Lymphs Abs: 2.2 10*3/uL (ref 0.7–4.0)
MCH: 28 pg (ref 26.0–34.0)
MCHC: 31.6 g/dL (ref 30.0–36.0)
MCV: 88.4 fL (ref 78.0–100.0)
Monocytes Absolute: 0.6 10*3/uL (ref 0.1–1.0)
Monocytes Relative: 5 %
Neutro Abs: 7.7 10*3/uL (ref 1.7–7.7)
Neutrophils Relative %: 72 %
Platelets: 393 10*3/uL (ref 150–400)
RBC: 4.83 MIL/uL (ref 4.22–5.81)
RDW: 13.2 % (ref 11.5–15.5)
WBC: 10.7 10*3/uL — ABNORMAL HIGH (ref 4.0–10.5)

## 2017-07-14 MED ORDER — HYDROCODONE-ACETAMINOPHEN 5-325 MG PO TABS
1.0000 | ORAL_TABLET | Freq: Once | ORAL | Status: AC
  Administered 2017-07-14: 1 via ORAL
  Filled 2017-07-14: qty 1

## 2017-07-14 MED ORDER — OXYCODONE-ACETAMINOPHEN 5-325 MG PO TABS: 1.0000 | ORAL_TABLET | Freq: Four times a day (QID) | ORAL | 0 refills | Status: DC | PRN

## 2017-07-14 MED ORDER — TAMSULOSIN HCL 0.4 MG PO CAPS: 0.4000 mg | ORAL_CAPSULE | Freq: Every day | ORAL | 0 refills | Status: DC

## 2017-07-14 NOTE — Discharge Instructions (Addendum)
Please read attached information. If you experience any new or worsening signs or symptoms please return to the emergency room for evaluation. Please follow-up with your primary care provider or specialist as discussed. Please use medication prescribed only as directed and discontinue taking if you have any concerning signs or symptoms.   °

## 2017-07-14 NOTE — ED Provider Notes (Signed)
Patient placed in Quick Look pathway, seen and evaluated   Chief Complaint: "Prostate pain", left flank pain  HPI:   Patient presents with 3-day history of what he describes as prostate pain.  Pain is intermittent, sharp and stabbing in the perineal region.  He denies any pain with bowel movements.  This morning he awoke with left-sided flank pain which does not radiate.  He does endorse decreased urine output and states that his urine was darker but denies urgency, frequency, dysuria, or hematuria.  Denies fevers, chills, nausea, vomiting, diarrhea, constipation, or abdominal pain.  ROS: +ve for Flank pain, perineal pain, decreased urine output -ve for abdominal pain, fevers, chills, nausea, vomiting, diarrhea, constipation, hematuria  Physical Exam:   Gen: No distress, somewhat uncomfortable in appearance  Neuro: Awake and Alert  Skin: Warm    Focused Exam: Abdomen is soft and nontender, no CVA tenderness but there is tenderness palpation in the left paralumbar region.  No midline spine tenderness.  Bowel sounds are active in all 4 quadrants.   Initiation of care has begun. The patient has been counseled on the process, plan, and necessity for staying for the completion/evaluation, and the remainder of the medical screening examination    Debroah Baller 07/14/17 1422    Jola Schmidt, MD 07/15/17 1029

## 2017-07-14 NOTE — ED Triage Notes (Signed)
Pt. Stated, Zachary Gillespie had prostate pain for 3 days, and "kidney stones today. My urine is darker.

## 2017-07-14 NOTE — ED Provider Notes (Signed)
[facility] EMERGENCY DEPARTMENT Provider Note   CSN: [id number] Arrival date & time: 07/14/17  1347     History   Chief Complaint Chief Complaint  Patient presents with  . Nephrolithiasis  . Prostate Check  . Hematuria    HPI Zachary Gillespie is a 52 y.o. male.  HPI   52 year old male presents today with complaints of left-sided flank pain. Patient notes acute onset of sharp pain in his left flank radiating down in the groin. He notes this with this morning and has remained persistent. Patient reports he was given pain medication prior to my arrival which seemed to improve his symptoms. Patient denies any preceding dysuria, nausea vomiting or fever.patient had since symptoms of started he has had cold to urinating. Patient also notes that over the last several days he's had some discomfort in his rectal region and describes this as "prostate pain. Patient does note that he had a colonoscopy last week.   Past Medical History:  Diagnosis Date  . DDD (degenerative disc disease), lumbar   . Diabetes mellitus without complication ([place])   . Dyslipidemia   . GERD (gastroesophageal reflux disease)   . History of hiatal hernia   . History of kidney stones    H/O  . HLD (hyperlipidemia)   . Hypertension   . Obesity   . Prediabetes   . Sciatica of left side   . Tubular adenoma of colon    2019 (Dr. Benson [place])    Patient Active Problem List   Diagnosis Date Noted  . Prediabetes   . HLD (hyperlipidemia)   . Obesity   . Hypertension   . DDD (degenerative disc disease), lumbar   . GERD (gastroesophageal reflux disease)   . Sciatica of left side   . Dyslipidemia     Past Surgical History:  Procedure Laterality Date  . COLONOSCOPY    . ESOPHAGOGASTRODUODENOSCOPY    . EYE SURGERY    . TONSILLECTOMY    . VASECTOMY          Home Medications    Prior to Admission medications   Medication Sig Start Date End Date Taking? Authorizing Provider  atorvastatin  (LIPITOR) 20 MG tablet Take 1 tablet (20 mg total) by mouth daily. Patient taking differently: Take 20 mg by mouth every morning.  05/22/17   [name], MD  celecoxib (CELEBREX) 200 MG capsule TAKE 1 CAPSULE DAILY 10/01/16   [name], MD  cetirizine (ZYRTEC) 10 MG tablet Take 10 mg by mouth daily as needed for allergies.    [provider]  escitalopram (LEXAPRO) 10 MG tablet TAKE 1 TABLET DAILY Patient taking differently: TAKE 1 TABLET DAILY-AM 05/23/17   [name], MD  fluticasone (FLONASE) 50 MCG/ACT nasal spray Place 2 sprays into both nostrils daily as needed for allergies or rhinitis.    [provider]  hydrochlorothiazide (HYDRODIURIL) 25 MG tablet Take 1 tablet (25 mg total) by mouth daily. 05/19/17   [name], MD  Insulin Pen Needle (BD PEN NEEDLE MICRO U/F) 32G X 6 MM MISC Use as directed to inject Saxenda SQ QD. Patient not taking: Reported on 07/04/2017 05/30/17   [name], MD  Liraglutide -Weight Management (SAXENDA) 18 MG/3ML SOPN Inject 0.6 mg into the skin daily. Increase by 0.6mg  q week until max dose of 3mg  is reached Patient taking differently: Inject 3 mg into the skin daily.  05/30/17   [name], MD  losartan (COZAAR) 100  MG tablet TAKE 1 TABLET DAILY Patient taking differently: TAKE 1 TABLET DAILY-PM 08/21/16   [name], MD  Melatonin 10 MG TABS Take 10 mg by mouth at bedtime.     [provider]  Multiple Vitamins-Minerals (ONE-A-DAY MENS HEALTH FORMULA PO) Take 1 tablet by mouth daily.    [provider]  OMEGA-3 KRILL OIL 300 MG CAPS Take 300 mg by mouth daily.     [provider]  omeprazole (PRILOSEC) 40 MG capsule TAKE 1 CAPSULE DAILY Patient taking differently: TAKE 1 CAPSULE DAILY-AM 09/19/16   [name], MD  oxyCODONE-acetaminophen (PERCOCET/ROXICET) 5-325 MG tablet Take 1 tablet by mouth every 6 (six) hours as needed for severe pain. 07/14/17   Artemio Dobie, Dellis Filbert,  PA-C  tamsulosin (FLOMAX) 0.4 MG CAPS capsule Take 1 capsule (0.4 mg total) by mouth daily. 07/14/17   Okey Regal, PA-C    Family History Family History  Problem Relation Age of Onset  . Heart disease Father   . Diabetes Father   . Hyperlipidemia Father   . Diabetes Sister   . Heart disease Paternal Grandfather     Social History Social History   Tobacco Use  . Smoking status: Never Smoker  . Smokeless tobacco: Never Used  Substance Use Topics  . Alcohol use: Yes    Comment: RARE  . Drug use: No     Allergies   Penicillins; Keflex [cephalexin]; and Sulfa antibiotics   Review of Systems Review of Systems  All other systems reviewed and are negative.    Physical Exam Updated Vital Signs BP 122/79 (BP Location: Right Arm)   Pulse 79   Temp 98.4 F (36.9 C) (Oral)   Resp 16   SpO2 100%   Physical Exam  Constitutional: He is oriented to person, place, and time. He appears well-developed and well-nourished.  HENT:  Head: Normocephalic and atraumatic.  Eyes: Pupils are equal, round, and reactive to light. Conjunctivae are normal. Right eye exhibits no discharge. Left eye exhibits no discharge. No scleral icterus.  Neck: Normal range of motion. No JVD present. No tracheal deviation present.  Pulmonary/Chest: Effort normal. No stridor.  Abdominal: Soft. He exhibits no distension and no mass. There is no tenderness. There is no rebound and no guarding. No hernia.  Neurological: He is alert and oriented to person, place, and time. Coordination normal.  Psychiatric: He has a normal mood and affect. His behavior is normal. Judgment and thought content normal.  Nursing note and vitals reviewed.    ED Treatments / Results  Labs (all labs ordered are listed, but only abnormal results are displayed) Labs Reviewed  URINALYSIS, ROUTINE W REFLEX MICROSCOPIC - Abnormal; Notable for the following components:      Result Value   Color, Urine AMBER (*)    APPearance HAZY  (*)    Hgb urine dipstick LARGE (*)    Protein, ur 30 (*)    RBC / HPF >50 (*)    Bacteria, UA RARE (*)    All other components within normal limits  COMPREHENSIVE METABOLIC PANEL - Abnormal; Notable for the following components:   Glucose, Bld 110 (*)    All other components within normal limits  CBC WITH DIFFERENTIAL/PLATELET - Abnormal; Notable for the following components:   WBC 10.7 (*)    All other components within normal limits  URINE CULTURE    EKG None  Radiology Ct Renal Stone Study  Result Date: 07/14/2017 CLINICAL DATA:  Prostate pain for 3  days.  Dark urine. EXAM: CT ABDOMEN AND PELVIS WITHOUT CONTRAST TECHNIQUE: Multidetector CT imaging of the abdomen and pelvis was performed following the standard protocol without IV contrast. COMPARISON:  08/04/2012 CT FINDINGS: LOWER CHEST: Lung bases are clear. Included heart size is normal. No pericardial effusion. HEPATOBILIARY: Liver and gallbladder are normal. PANCREAS: Normal. SPLEEN: Normal. ADRENALS/URINARY TRACT: Kidneys are orthotopic. There is an oblong 8 x 3 x 5 mm left UVJ calculus without significant left-sided hydroureteronephrosis. Bilateral renal calculi are noted the largest is interpolar on the right measuring approximately 9 x 4 x 5 mm. Slightly larger simple cyst is identified arising from the posterior aspect of the left kidney measuring 3.9 cm in diameter, previously estimated at 2 cm. The urinary bladder is nondistended. No intraluminal mass, focal mural thickening calculus is seen. STOMACH/BOWEL: The stomach, small and large bowel are normal in course and caliber without inflammatory changes. Normal appendix. VASCULAR/LYMPHATIC: Aortoiliac vessels are normal in course and caliber. No lymphadenopathy by CT size criteria. REPRODUCTIVE: Central zone calcifications are identified of the normal size prostate. Seminal vesicles are. OTHER: No intraperitoneal free fluid or free air. MUSCULOSKELETAL: Nonacute. IMPRESSION: 1.  Nonobstructing left UVJ calculus measuring 8 x 3 x 5 mm. 2. Bilateral nonobstructing renal calculi are noted the largest the interpolar right kidney measuring 9 x 4 x 5 mm. 3. Slightly larger simple cyst arising the posterior aspect of the left kidney measuring 3.9 cm diameter versus 2 cm previously Electronically Signed   By: Ashley Royalty M.D.   On: 07/14/2017 16:49    Procedures Procedures (including critical care time)  Medications Ordered in ED Medications  HYDROcodone-acetaminophen (NORCO/VICODIN) 5-325 MG per tablet 1 tablet (1 tablet Oral Given 07/14/17 1420)     Initial Impression / Assessment and Plan / ED Course  I have reviewed the triage vital signs and the nursing notes.  Pertinent labs & imaging results that were available during my care of the patient were reviewed by me and considered in my medical decision making (see chart for details).    Labs: CMP, CBC, urinalysis  Imaging:CT renal  Consults:  Therapeutics:  Discharge Meds: Percocet, Flomax ( patient is taking Flomax in the past without any complications.)   Assessment/Plan:  52 year old male presents today with ureterolithiasis. He has a rather large stone at the distal ureter. No signs of infection or obstructive pathology at this time. Patient will follow up closely with outpatient urology for further evaluation and management of this. He'll return immediately if he develops any new or worsening signs or symptoms. Patient also had some complaints of prostatic pain, CT shows normal prostate, patient will follow up as an outpatient for further evaluation and management of this with his primary care provider. He'll return immediately if develops any new or worsening signs or symptoms. Patient had no further questions or concerns at time of discharge.  Final Clinical Impressions(s) / ED Diagnoses   Final diagnoses:  Nephrolithiasis    ED Discharge Orders        Ordered    oxyCODONE-acetaminophen  (PERCOCET/ROXICET) 5-325 MG tablet  Every 6 hours PRN     07/14/17 1831    tamsulosin (FLOMAX) 0.4 MG CAPS capsule  Daily     07/14/17 1831       Okey Regal, PA-C 07/14/17 2209    Fredia Sorrow, MD 07/16/17 [phone]

## 2017-07-15 ENCOUNTER — Other Ambulatory Visit: Payer: Self-pay | Admitting: Family Medicine

## 2017-07-15 ENCOUNTER — Telehealth: Payer: Self-pay | Admitting: Family Medicine

## 2017-07-15 DIAGNOSIS — N200 Calculus of kidney: Secondary | ICD-10-CM

## 2017-07-15 NOTE — Telephone Encounter (Signed)
Patient called in today stating that he was seen at the ER yesterday for a kidney stone and was referred by the ER to [facility] for a kidney stone. He called [facility] to try to schedule and appointment and was told that he needed a referral from our office since he has [employer]. May I proceed with this referral?

## 2017-07-15 NOTE — Telephone Encounter (Signed)
sure

## 2017-07-15 NOTE — Telephone Encounter (Signed)
SURE

## 2017-07-15 NOTE — Telephone Encounter (Signed)
Referral placed in epic and submitted on [employer]. Pending approval for patient to go see [facility]

## 2017-07-15 NOTE — Telephone Encounter (Signed)
Called patient and informed him that referral for [employer] is pending. Patient verbalized understanding.

## 2017-07-16 ENCOUNTER — Inpatient Hospital Stay: Admission: RE | Admit: 2017-07-16 | Source: Ambulatory Visit

## 2017-07-16 LAB — URINE CULTURE: Culture: NO GROWTH

## 2017-07-17 ENCOUNTER — Telehealth: Payer: Self-pay | Admitting: Surgery

## 2017-07-17 ENCOUNTER — Ambulatory Visit: Admitting: Family Medicine

## 2017-07-17 NOTE — Telephone Encounter (Signed)
Patient has called and stated that he would like to cancel his surgery at this time. He was seen this week in the ED at [facility] for kidney stones. He states that he would like go forward with taking care of seeing Urology for the kidney stones due to the amount of pain he is in. He will call back to reschedule his surgery. I have discuss with him that he will more than likely need to have another office visit with Dr Burt Knack prior to rescheduling to update his H&P. When the patient calls back, we will determine this at that time. Dr Burt Knack has been made aware of the cancellation of the surgery on 07/18/17 for the right inguinal hernia repair.

## 2017-07-18 ENCOUNTER — Ambulatory Visit
Admission: RE | Admit: 2017-07-18 | Discharge: 2017-07-18 | Disposition: A | Discharge status: Home / Self Care | Source: Ambulatory Visit | Attending: Urology | Admitting: Urology

## 2017-07-18 ENCOUNTER — Other Ambulatory Visit: Payer: Self-pay | Admitting: Radiology

## 2017-07-18 ENCOUNTER — Ambulatory Visit (INDEPENDENT_AMBULATORY_CARE_PROVIDER_SITE_OTHER): Admitting: Urology

## 2017-07-18 ENCOUNTER — Encounter: Payer: Self-pay | Admitting: Urology

## 2017-07-18 ENCOUNTER — Ambulatory Visit: Admission: RE | Admit: 2017-07-18 | Source: Ambulatory Visit | Admitting: Surgery

## 2017-07-18 VITALS — BP 114/79 | HR 103 | Wt 263.8 lb

## 2017-07-18 DIAGNOSIS — N200 Calculus of kidney: Secondary | ICD-10-CM | POA: Diagnosis present

## 2017-07-18 DIAGNOSIS — N202 Calculus of kidney with calculus of ureter: Secondary | ICD-10-CM | POA: Diagnosis not present

## 2017-07-18 DIAGNOSIS — N201 Calculus of ureter: Secondary | ICD-10-CM | POA: Diagnosis not present

## 2017-07-18 LAB — URINALYSIS, COMPLETE
Bilirubin, UA: NEGATIVE
Glucose, UA: NEGATIVE
Ketones, UA: NEGATIVE
Leukocytes, UA: NEGATIVE
Nitrite, UA: NEGATIVE
SPEC GRAV UA: 1.025 (ref 1.005–1.030)
UUROB: 0.2 mg/dL (ref 0.2–1.0)
pH, UA: 5.5 (ref 5.0–7.5)

## 2017-07-18 LAB — MICROSCOPIC EXAMINATION: Epithelial Cells (non renal): NONE SEEN /hpf (ref 0–10)

## 2017-07-20 ENCOUNTER — Encounter: Payer: Self-pay | Admitting: Urology

## 2017-07-20 DIAGNOSIS — N201 Calculus of ureter: Secondary | ICD-10-CM | POA: Insufficient documentation

## 2017-07-20 DIAGNOSIS — N200 Calculus of kidney: Secondary | ICD-10-CM | POA: Insufficient documentation

## 2017-07-20 NOTE — Progress Notes (Signed)
07/18/2017 10:03 AM   [name] September 15, 1965 [id number]  Referring provider: [name], MD [address] [facility] [place], [place] [address]  Chief Complaint  Patient presents with  . Nephrolithiasis    HPI: Zachary Gillespie is a 52 year old male who presented to the [facility] ED on 07/14/2017 with a 3-day history of progressively worsening left flank pain radiating to the left groin region he also complained of mild rectal fullness. There were no identifiable precipitating, aggravating or alleviating factors.  He denied fever, chills, nausea or vomiting.  A stone protocol CT of the abdomen and pelvis was performed which showed a 3 x 5 x 8 mm left distal ureteral calculus with mild hydronephrosis and hydroureter.  He also had bilateral, nonobstructing renal calculi.  He does note a previous history of stones however has not required intervention.  He was discharged with outpatient management on tamsulosin and oxycodone.  Since his ED visit he has not required the oxycodone.  His symptoms have been minimal.   PMH: Past Medical History:  Diagnosis Date  . DDD (degenerative disc disease), lumbar   . Diabetes mellitus without complication ([place])   . Dyslipidemia   . GERD (gastroesophageal reflux disease)   . History of hiatal hernia   . History of kidney stones    H/O  . HLD (hyperlipidemia)   . Hypertension   . Obesity   . Prediabetes   . Sciatica of left side   . Tubular adenoma of colon    2019 (Dr. Benson [place])    Surgical History: Past Surgical History:  Procedure Laterality Date  . COLONOSCOPY    . ESOPHAGOGASTRODUODENOSCOPY    . EYE SURGERY    . TONSILLECTOMY    . VASECTOMY      Home Medications:  Allergies as of 07/18/2017      Reactions   Penicillins Anaphylaxis, Other (See Comments)   Has patient had a PCN reaction causing immediate rash, facial/tongue/throat swelling, SOB or lightheadedness with hypotension: Unknown Has patient had a PCN reaction  causing severe rash involving mucus membranes or skin necrosis: No Has patient had a PCN reaction that required hospitalization: Yes Has patient had a PCN reaction occurring within the last 10 years: No If all of the above answers are "NO", then may proceed with Cephalosporin use.   Keflex [cephalexin] Other (See Comments)   Childhood allergy   Sulfa Antibiotics Other (See Comments)   Childhood allergy      Medication List        Accurate as of 07/18/17 11:59 PM. Always use your most recent med list.          atorvastatin 20 MG tablet Commonly known as:  LIPITOR Take 1 tablet (20 mg total) by mouth daily.   celecoxib 200 MG capsule Commonly known as:  CELEBREX TAKE 1 CAPSULE DAILY   cetirizine 10 MG tablet Commonly known as:  ZYRTEC Take 10 mg by mouth daily as needed for allergies.   escitalopram 10 MG tablet Commonly known as:  LEXAPRO TAKE 1 TABLET DAILY   fluticasone 50 MCG/ACT nasal spray Commonly known as:  FLONASE Place 2 sprays into both nostrils daily as needed for allergies or rhinitis.   hydrochlorothiazide 25 MG tablet Commonly known as:  HYDRODIURIL Take 1 tablet (25 mg total) by mouth daily.   Insulin Pen Needle 32G X 6 MM Misc Commonly known as:  BD PEN NEEDLE MICRO U/F Use as directed to inject Saxenda SQ QD.   Liraglutide -  Weight Management 18 MG/3ML Sopn Commonly known as:  SAXENDA Inject 0.6 mg into the skin daily. Increase by 0.76m q week until max dose of 325mis reached   losartan 100 MG tablet Commonly known as:  COZAAR TAKE 1 TABLET DAILY   Melatonin 10 MG Tabs Take 10 mg by mouth at bedtime.   Omega-3 Krill Oil 300 MG Caps Take 300 mg by mouth daily.   omeprazole 40 MG capsule Commonly known as:  PRILOSEC TAKE 1 CAPSULE DAILY   ONE-A-DAY MENS HEALTH FORMULA PO Take 1 tablet by mouth daily.   oxyCODONE-acetaminophen 5-325 MG tablet Commonly known as:  PERCOCET/ROXICET Take 1 tablet by mouth every 6 (six) hours as needed for  severe pain.   tamsulosin 0.4 MG Caps capsule Commonly known as:  FLOMAX Take 1 capsule (0.4 mg total) by mouth daily.       Allergies:  Allergies  Allergen Reactions  . Penicillins Anaphylaxis and Other (See Comments)    Has patient had a PCN reaction causing immediate rash, facial/tongue/throat swelling, SOB or lightheadedness with hypotension: Unknown Has patient had a PCN reaction causing severe rash involving mucus membranes or skin necrosis: No Has patient had a PCN reaction that required hospitalization: Yes Has patient had a PCN reaction occurring within the last 10 years: No If all of the above answers are "NO", then may proceed with Cephalosporin use.   . Marland Kitcheneflex [Cephalexin] Other (See Comments)    Childhood allergy  . Sulfa Antibiotics Other (See Comments)    Childhood allergy    Family History: Family History  Problem Relation Age of Onset  . Heart disease Father   . Diabetes Father   . Hyperlipidemia Father   . Diabetes Sister   . Heart disease Paternal Grandfather   . Bladder Cancer Neg Hx   . Kidney cancer Neg Hx   . Prostate cancer Neg Hx     Social History:  reports that he has never smoked. He has never used smokeless tobacco. He reports that he drinks alcohol. He reports that he does not use drugs.  ROS: UROLOGY Frequent Urination?: No Hard to postpone urination?: No Burning/pain with urination?: No Get up at night to urinate?: No Leakage of urine?: No Urine stream starts and stops?: No Trouble starting stream?: No Do you have to strain to urinate?: No Blood in urine?: No Urinary tract infection?: No Sexually transmitted disease?: No Injury to kidneys or bladder?: No Painful intercourse?: No Weak stream?: No Erection problems?: No Penile pain?: No  Gastrointestinal Nausea?: No Vomiting?: No Indigestion/heartburn?: No Diarrhea?: No Constipation?: No  Constitutional Fever: No Night sweats?: No Weight loss?: No Fatigue?:  No  Skin Skin rash/lesions?: No Itching?: No  Eyes Blurred vision?: No Double vision?: No  Ears/Nose/Throat Sore throat?: No Sinus problems?: No  Hematologic/Lymphatic Swollen glands?: No Easy bruising?: No  Cardiovascular Leg swelling?: No Chest pain?: No  Respiratory Cough?: No Shortness of breath?: No  Endocrine Excessive thirst?: No  Musculoskeletal Back pain?: No Joint pain?: No  Neurological Headaches?: No Dizziness?: No  Psychologic Depression?: No Anxiety?: No  Physical Exam: BP 114/79   Pulse (!) 103   Wt 263 lb 12.8 oz (119.7 kg)   BMI 38.96 kg/m   Constitutional:  Alert and oriented, No acute distress. HEENT: [place] AT, moist mucus membranes.  Trachea midline, no masses. Cardiovascular: No clubbing, cyanosis, or edema.  RRR Respiratory: Normal respiratory effort, no increased work of breathing.  Lungs clear GI: Abdomen is soft, nontender, nondistended,  no abdominal masses GU: No CVA tenderness Lymph: No cervical or inguinal lymphadenopathy. Skin: No rashes, bruises or suspicious lesions. Neurologic: Grossly intact, no focal deficits, moving all 4 extremities. Psychiatric: Normal mood and affect.  Laboratory Data: Lab Results  Component Value Date   WBC 10.7 (H) 07/14/2017   HGB 13.5 07/14/2017   HCT 42.7 07/14/2017   MCV 88.4 07/14/2017   PLT 393 07/14/2017    Lab Results  Component Value Date   CREATININE 0.98 07/14/2017    Lab Results  Component Value Date   PSA 0.8 05/19/2017   PSA 1.43 10/06/2014   PSA 1.09 10/04/2013     Urinalysis    Component Value Date/Time   COLORURINE AMBER (A) 07/14/2017 1414   APPEARANCEUR Clear 07/18/2017 1102   LABSPEC 1.029 07/14/2017 1414   PHURINE 5.0 07/14/2017 1414   GLUCOSEU Negative 07/18/2017 1102   HGBUR LARGE (A) 07/14/2017 1414   BILIRUBINUR Negative 07/18/2017 1102   KETONESUR NEGATIVE 07/14/2017 1414   PROTEINUR Trace (A) 07/18/2017 1102   PROTEINUR 30 (A) 07/14/2017 1414    UROBILINOGEN 0.2 08/14/2012 1250   NITRITE Negative 07/18/2017 1102   NITRITE NEGATIVE 07/14/2017 1414   LEUKOCYTESUR Negative 07/18/2017 1102    Lab Results  Component Value Date   LABMICR See below: 07/18/2017   WBCUA 0-5 07/18/2017   RBCUA 0-2 07/18/2017   LABEPIT None seen 07/18/2017   MUCUS Present (A) 07/18/2017   BACTERIA Few (A) 07/18/2017    Pertinent Imaging: CT images were reviewed personally  Results for orders placed during the hospital encounter of 07/14/17  CT Renal Stone Study   Narrative CLINICAL DATA:  Prostate pain for 3 days.  Dark urine.  EXAM: CT ABDOMEN AND PELVIS WITHOUT CONTRAST  TECHNIQUE: Multidetector CT imaging of the abdomen and pelvis was performed following the standard protocol without IV contrast.  COMPARISON:  08/04/2012 CT  FINDINGS: LOWER CHEST: Lung bases are clear. Included heart size is normal. No pericardial effusion.  HEPATOBILIARY: Liver and gallbladder are normal.  PANCREAS: Normal.  SPLEEN: Normal.  ADRENALS/URINARY TRACT: Kidneys are orthotopic. There is an oblong 8 x 3 x 5 mm left UVJ calculus without significant left-sided hydroureteronephrosis. Bilateral renal calculi are noted the largest is interpolar on the right measuring approximately 9 x 4 x 5 mm. Slightly larger simple cyst is identified arising from the posterior aspect of the left kidney measuring 3.9 cm in diameter, previously estimated at 2 cm. The urinary bladder is nondistended. No intraluminal mass, focal mural thickening calculus is seen.  STOMACH/BOWEL: The stomach, small and large bowel are normal in course and caliber without inflammatory changes. Normal appendix.  VASCULAR/LYMPHATIC: Aortoiliac vessels are normal in course and caliber. No lymphadenopathy by CT size criteria.  REPRODUCTIVE: Central zone calcifications are identified of the normal size prostate. Seminal vesicles are.  OTHER: No intraperitoneal free fluid or free  air.  MUSCULOSKELETAL: Nonacute.  IMPRESSION: 1. Nonobstructing left UVJ calculus measuring 8 x 3 x 5 mm. 2. Bilateral nonobstructing renal calculi are noted the largest the interpolar right kidney measuring 9 x 4 x 5 mm. 3. Slightly larger simple cyst arising the posterior aspect of the left kidney measuring 3.9 cm diameter versus 2 cm previously   Electronically Signed   By: Ashley Royalty M.D.   On: 07/14/2017 16:49     Assessment & Plan:   52 year old male with a large left distal ureteral calculus.  He was informed that based on stone size it is less likely it  will pass spontaneously.  I did discuss a brief trial of MET.  Options of ureteroscopic removal and shockwave lithotripsy were discussed.  He was informed that based on stone location ureteroscopic removal would be the most successful procedure.  The pros and cons of each procedure was discussed.  His stone density is ~ 600 HU and he has elected for shockwave lithotripsy as a noninvasive option.  The indications and nature of the planned procedure were discussed as well as the potential benefits and expected outcome.  Alternatives were discussed as described above.  The most common complications and side effects were discussed as outlined in the [facility] consent form.  It was stressed that there is no guarantee that lithotripsy will be successful and he could require retreatment or alternative treatment.  The possibility of renal colic from obstructing stone fragments requiring stent placement or ureteroscopy was also discussed.  He indicated all questions were answered to his satisfaction and desires to proceed.    [name], [place] [address], [place], [place] [address] [phone]

## 2017-07-20 NOTE — H&P (View-Only) (Signed)
07/18/2017 10:03 AM   [name] September 15, 1965 [id number]  Referring provider: [name], MD [address] [facility] [place], [place] [address]  Chief Complaint  Patient presents with  . Nephrolithiasis    HPI: Zachary Gillespie is a 52 year old male who presented to [facility] on 07/14/2017 with a 3-day history of progressively worsening left flank pain radiating to the left groin region he also complained of mild rectal fullness. There were no identifiable precipitating, aggravating or alleviating factors.  He denied fever, chills, nausea or vomiting.  A stone protocol CT of the abdomen and pelvis was performed which showed a 3 x 5 x 8 mm left distal ureteral calculus with mild hydronephrosis and hydroureter.  He also had bilateral, nonobstructing renal calculi.  He does note a previous history of stones however has not required intervention.  He was discharged with outpatient management on tamsulosin and oxycodone.  Since his ED visit he has not required the oxycodone.  His symptoms have been minimal.   PMH: Past Medical History:  Diagnosis Date  . DDD (degenerative disc disease), lumbar   . Diabetes mellitus without complication ([place])   . Dyslipidemia   . GERD (gastroesophageal reflux disease)   . History of hiatal hernia   . History of kidney stones    H/O  . HLD (hyperlipidemia)   . Hypertension   . Obesity   . Prediabetes   . Sciatica of left side   . Tubular adenoma of colon    2019 (Dr. Benson [place])    Surgical History: Past Surgical History:  Procedure Laterality Date  . COLONOSCOPY    . ESOPHAGOGASTRODUODENOSCOPY    . EYE SURGERY    . TONSILLECTOMY    . VASECTOMY      Home Medications:  Allergies as of 07/18/2017      Reactions   Penicillins Anaphylaxis, Other (See Comments)   Has patient had a PCN reaction causing immediate rash, facial/tongue/throat swelling, SOB or lightheadedness with hypotension: Unknown Has patient had a PCN reaction  causing severe rash involving mucus membranes or skin necrosis: No Has patient had a PCN reaction that required hospitalization: Yes Has patient had a PCN reaction occurring within the last 10 years: No If all of the above answers are "NO", then may proceed with Cephalosporin use.   Keflex [cephalexin] Other (See Comments)   Childhood allergy   Sulfa Antibiotics Other (See Comments)   Childhood allergy      Medication List        Accurate as of 07/18/17 11:59 PM. Always use your most recent med list.          atorvastatin 20 MG tablet Commonly known as:  LIPITOR Take 1 tablet (20 mg total) by mouth daily.   celecoxib 200 MG capsule Commonly known as:  CELEBREX TAKE 1 CAPSULE DAILY   cetirizine 10 MG tablet Commonly known as:  ZYRTEC Take 10 mg by mouth daily as needed for allergies.   escitalopram 10 MG tablet Commonly known as:  LEXAPRO TAKE 1 TABLET DAILY   fluticasone 50 MCG/ACT nasal spray Commonly known as:  FLONASE Place 2 sprays into both nostrils daily as needed for allergies or rhinitis.   hydrochlorothiazide 25 MG tablet Commonly known as:  HYDRODIURIL Take 1 tablet (25 mg total) by mouth daily.   Insulin Pen Needle 32G X 6 MM Misc Commonly known as:  BD PEN NEEDLE MICRO U/F Use as directed to inject Saxenda SQ QD.   Liraglutide -  Weight Management 18 MG/3ML Sopn Commonly known as:  SAXENDA Inject 0.6 mg into the skin daily. Increase by 0.76m q week until max dose of 325mis reached   losartan 100 MG tablet Commonly known as:  COZAAR TAKE 1 TABLET DAILY   Melatonin 10 MG Tabs Take 10 mg by mouth at bedtime.   Omega-3 Krill Oil 300 MG Caps Take 300 mg by mouth daily.   omeprazole 40 MG capsule Commonly known as:  PRILOSEC TAKE 1 CAPSULE DAILY   ONE-A-DAY MENS HEALTH FORMULA PO Take 1 tablet by mouth daily.   oxyCODONE-acetaminophen 5-325 MG tablet Commonly known as:  PERCOCET/ROXICET Take 1 tablet by mouth every 6 (six) hours as needed for  severe pain.   tamsulosin 0.4 MG Caps capsule Commonly known as:  FLOMAX Take 1 capsule (0.4 mg total) by mouth daily.       Allergies:  Allergies  Allergen Reactions  . Penicillins Anaphylaxis and Other (See Comments)    Has patient had a PCN reaction causing immediate rash, facial/tongue/throat swelling, SOB or lightheadedness with hypotension: Unknown Has patient had a PCN reaction causing severe rash involving mucus membranes or skin necrosis: No Has patient had a PCN reaction that required hospitalization: Yes Has patient had a PCN reaction occurring within the last 10 years: No If all of the above answers are "NO", then may proceed with Cephalosporin use.   . [place] Kitcheneflex [Cephalexin] Other (See Comments)    Childhood allergy  . Sulfa Antibiotics Other (See Comments)    Childhood allergy    Family History: Family History  Problem Relation Age of Onset  . Heart disease Father   . Diabetes Father   . Hyperlipidemia Father   . Diabetes Sister   . Heart disease Paternal Grandfather   . Bladder Cancer Neg Hx   . Kidney cancer Neg Hx   . Prostate cancer Neg Hx     Social History:  reports that he has never smoked. He has never used smokeless tobacco. He reports that he drinks alcohol. He reports that he does not use drugs.  ROS: UROLOGY Frequent Urination?: No Hard to postpone urination?: No Burning/pain with urination?: No Get up at night to urinate?: No Leakage of urine?: No Urine stream starts and stops?: No Trouble starting stream?: No Do you have to strain to urinate?: No Blood in urine?: No Urinary tract infection?: No Sexually transmitted disease?: No Injury to kidneys or bladder?: No Painful intercourse?: No Weak stream?: No Erection problems?: No Penile pain?: No  Gastrointestinal Nausea?: No Vomiting?: No Indigestion/heartburn?: No Diarrhea?: No Constipation?: No  Constitutional Fever: No Night sweats?: No Weight loss?: No Fatigue?:  No  Skin Skin rash/lesions?: No Itching?: No  Eyes Blurred vision?: No Double vision?: No  Ears/Nose/Throat Sore throat?: No Sinus problems?: No  Hematologic/Lymphatic Swollen glands?: No Easy bruising?: No  Cardiovascular Leg swelling?: No Chest pain?: No  Respiratory Cough?: No Shortness of breath?: No  Endocrine Excessive thirst?: No  Musculoskeletal Back pain?: No Joint pain?: No  Neurological Headaches?: No Dizziness?: No  Psychologic Depression?: No Anxiety?: No  Physical Exam: BP 114/79   Pulse (!) 103   Wt 263 lb 12.8 oz (119.7 kg)   BMI 38.96 kg/m   Constitutional:  Alert and oriented, No acute distress. HEENT: [place] AT, moist mucus membranes.  Trachea midline, no masses. Cardiovascular: No clubbing, cyanosis, or edema.  RRR Respiratory: Normal respiratory effort, no increased work of breathing.  Lungs clear GI: Abdomen is soft, nontender, nondistended,  no abdominal masses GU: No CVA tenderness Lymph: No cervical or inguinal lymphadenopathy. Skin: No rashes, bruises or suspicious lesions. Neurologic: Grossly intact, no focal deficits, moving all 4 extremities. Psychiatric: Normal mood and affect.  Laboratory Data: Lab Results  Component Value Date   WBC 10.7 (H) 07/14/2017   HGB 13.5 07/14/2017   HCT 42.7 07/14/2017   MCV 88.4 07/14/2017   PLT 393 07/14/2017    Lab Results  Component Value Date   CREATININE 0.98 07/14/2017    Lab Results  Component Value Date   PSA 0.8 05/19/2017   PSA 1.43 10/06/2014   PSA 1.09 10/04/2013     Urinalysis    Component Value Date/Time   COLORURINE AMBER (A) 07/14/2017 1414   APPEARANCEUR Clear 07/18/2017 1102   LABSPEC 1.029 07/14/2017 1414   PHURINE 5.0 07/14/2017 1414   GLUCOSEU Negative 07/18/2017 1102   HGBUR LARGE (A) 07/14/2017 1414   BILIRUBINUR Negative 07/18/2017 1102   KETONESUR NEGATIVE 07/14/2017 1414   PROTEINUR Trace (A) 07/18/2017 1102   PROTEINUR 30 (A) 07/14/2017 1414    UROBILINOGEN 0.2 08/14/2012 1250   NITRITE Negative 07/18/2017 1102   NITRITE NEGATIVE 07/14/2017 1414   LEUKOCYTESUR Negative 07/18/2017 1102    Lab Results  Component Value Date   LABMICR See below: 07/18/2017   WBCUA 0-5 07/18/2017   RBCUA 0-2 07/18/2017   LABEPIT None seen 07/18/2017   MUCUS Present (A) 07/18/2017   BACTERIA Few (A) 07/18/2017    Pertinent Imaging: CT images were reviewed personally  Results for orders placed during the hospital encounter of 07/14/17  CT Renal Stone Study   Narrative CLINICAL DATA:  Prostate pain for 3 days.  Dark urine.  EXAM: CT ABDOMEN AND PELVIS WITHOUT CONTRAST  TECHNIQUE: Multidetector CT imaging of the abdomen and pelvis was performed following the standard protocol without IV contrast.  COMPARISON:  08/04/2012 CT  FINDINGS: LOWER CHEST: Lung bases are clear. Included heart size is normal. No pericardial effusion.  HEPATOBILIARY: Liver and gallbladder are normal.  PANCREAS: Normal.  SPLEEN: Normal.  ADRENALS/URINARY TRACT: Kidneys are orthotopic. There is an oblong 8 x 3 x 5 mm left UVJ calculus without significant left-sided hydroureteronephrosis. Bilateral renal calculi are noted the largest is interpolar on the right measuring approximately 9 x 4 x 5 mm. Slightly larger simple cyst is identified arising from the posterior aspect of the left kidney measuring 3.9 cm in diameter, previously estimated at 2 cm. The urinary bladder is nondistended. No intraluminal mass, focal mural thickening calculus is seen.  STOMACH/BOWEL: The stomach, small and large bowel are normal in course and caliber without inflammatory changes. Normal appendix.  VASCULAR/LYMPHATIC: Aortoiliac vessels are normal in course and caliber. No lymphadenopathy by CT size criteria.  REPRODUCTIVE: Central zone calcifications are identified of the normal size prostate. Seminal vesicles are.  OTHER: No intraperitoneal free fluid or free  air.  MUSCULOSKELETAL: Nonacute.  IMPRESSION: 1. Nonobstructing left UVJ calculus measuring 8 x 3 x 5 mm. 2. Bilateral nonobstructing renal calculi are noted the largest the interpolar right kidney measuring 9 x 4 x 5 mm. 3. Slightly larger simple cyst arising the posterior aspect of the left kidney measuring 3.9 cm diameter versus 2 cm previously   Electronically Signed   By: Ashley Royalty M.D.   On: 07/14/2017 16:49     Assessment & Plan:   52 year old male with a large left distal ureteral calculus.  He was informed that based on stone size it is less likely it  will pass spontaneously.  I did discuss a brief trial of MET.  Options of ureteroscopic removal and shockwave lithotripsy were discussed.  He was informed that based on stone location ureteroscopic removal would be the most successful procedure.  The pros and cons of each procedure was discussed.  His stone density is ~ 600 HU and he has elected for shockwave lithotripsy as a noninvasive option.  The indications and nature of the planned procedure were discussed as well as the potential benefits and expected outcome.  Alternatives were discussed as described above.  The most common complications and side effects were discussed as outlined in the [facility] consent form.  It was stressed that there is no guarantee that lithotripsy will be successful and he could require retreatment or alternative treatment.  The possibility of renal colic from obstructing stone fragments requiring stent placement or ureteroscopy was also discussed.  He indicated all questions were answered to his satisfaction and desires to proceed.    [name], [place] [address], [place], [place] [address] [phone]

## 2017-07-23 LAB — CULTURE, URINE COMPREHENSIVE

## 2017-07-24 ENCOUNTER — Ambulatory Visit

## 2017-07-24 ENCOUNTER — Encounter: Payer: Self-pay | Admitting: Urology

## 2017-07-24 ENCOUNTER — Encounter
Admission: RE | Disposition: A | Discharge status: Home / Self Care | Payer: Self-pay | Source: Ambulatory Visit | Attending: Urology

## 2017-07-24 ENCOUNTER — Ambulatory Visit
Admission: RE | Admit: 2017-07-24 | Discharge: 2017-07-24 | Disposition: A | Discharge status: Home / Self Care | Source: Ambulatory Visit | Attending: Urology | Admitting: Urology

## 2017-07-24 ENCOUNTER — Telehealth: Payer: Self-pay

## 2017-07-24 DIAGNOSIS — Z6838 Body mass index (BMI) 38.0-38.9, adult: Secondary | ICD-10-CM | POA: Insufficient documentation

## 2017-07-24 DIAGNOSIS — Z79899 Other long term (current) drug therapy: Secondary | ICD-10-CM | POA: Diagnosis not present

## 2017-07-24 DIAGNOSIS — Z794 Long term (current) use of insulin: Secondary | ICD-10-CM | POA: Diagnosis not present

## 2017-07-24 DIAGNOSIS — Z882 Allergy status to sulfonamides status: Secondary | ICD-10-CM | POA: Insufficient documentation

## 2017-07-24 DIAGNOSIS — E119 Type 2 diabetes mellitus without complications: Secondary | ICD-10-CM | POA: Insufficient documentation

## 2017-07-24 DIAGNOSIS — Z880 Allergy status to penicillin: Secondary | ICD-10-CM | POA: Insufficient documentation

## 2017-07-24 DIAGNOSIS — I10 Essential (primary) hypertension: Secondary | ICD-10-CM | POA: Insufficient documentation

## 2017-07-24 DIAGNOSIS — N201 Calculus of ureter: Secondary | ICD-10-CM

## 2017-07-24 DIAGNOSIS — K219 Gastro-esophageal reflux disease without esophagitis: Secondary | ICD-10-CM | POA: Insufficient documentation

## 2017-07-24 DIAGNOSIS — E785 Hyperlipidemia, unspecified: Secondary | ICD-10-CM | POA: Insufficient documentation

## 2017-07-24 DIAGNOSIS — N132 Hydronephrosis with renal and ureteral calculous obstruction: Secondary | ICD-10-CM | POA: Insufficient documentation

## 2017-07-24 DIAGNOSIS — E669 Obesity, unspecified: Secondary | ICD-10-CM | POA: Insufficient documentation

## 2017-07-24 DIAGNOSIS — Z881 Allergy status to other antibiotic agents status: Secondary | ICD-10-CM | POA: Insufficient documentation

## 2017-07-24 DIAGNOSIS — N200 Calculus of kidney: Secondary | ICD-10-CM

## 2017-07-24 HISTORY — PX: EXTRACORPOREAL SHOCK WAVE LITHOTRIPSY: SHX1557

## 2017-07-24 SURGERY — LITHOTRIPSY, ESWL
Anesthesia: Moderate Sedation | Laterality: Left

## 2017-07-24 MED ORDER — DIAZEPAM 5 MG PO TABS
ORAL_TABLET | ORAL | Status: AC
  Administered 2017-07-24: 10 mg via ORAL
  Filled 2017-07-24: qty 2

## 2017-07-24 MED ORDER — DIPHENHYDRAMINE HCL 25 MG PO CAPS
ORAL_CAPSULE | ORAL | Status: AC
  Administered 2017-07-24: 25 mg via ORAL
  Filled 2017-07-24: qty 1

## 2017-07-24 MED ORDER — TAMSULOSIN HCL 0.4 MG PO CAPS: 0.4000 mg | ORAL_CAPSULE | Freq: Every day | ORAL | 0 refills | Status: DC

## 2017-07-24 MED ORDER — ONDANSETRON HCL 4 MG/2ML IJ SOLN
4.0000 mg | Freq: Once | INTRAMUSCULAR | Status: AC | PRN
  Administered 2017-07-24: 4 mg via INTRAVENOUS

## 2017-07-24 MED ORDER — SODIUM CHLORIDE 0.9 % IV SOLN
INTRAVENOUS | Status: DC
  Administered 2017-07-24: 07:00:00 via INTRAVENOUS

## 2017-07-24 MED ORDER — CIPROFLOXACIN HCL 500 MG PO TABS
ORAL_TABLET | ORAL | Status: AC
  Administered 2017-07-24: 500 mg via ORAL
  Filled 2017-07-24: qty 1

## 2017-07-24 MED ORDER — HYDROCODONE-ACETAMINOPHEN 5-325 MG PO TABS: 1.0000 | ORAL_TABLET | Freq: Four times a day (QID) | ORAL | 0 refills | Status: DC | PRN

## 2017-07-24 MED ORDER — CIPROFLOXACIN HCL 500 MG PO TABS
500.0000 mg | ORAL_TABLET | ORAL | Status: AC
  Administered 2017-07-24: 500 mg via ORAL

## 2017-07-24 MED ORDER — DIAZEPAM 5 MG PO TABS
10.0000 mg | ORAL_TABLET | ORAL | Status: AC
  Administered 2017-07-24: 10 mg via ORAL

## 2017-07-24 MED ORDER — ONDANSETRON HCL 4 MG/2ML IJ SOLN
INTRAMUSCULAR | Status: AC
  Administered 2017-07-24: 4 mg via INTRAVENOUS
  Filled 2017-07-24: qty 2

## 2017-07-24 MED ORDER — DIPHENHYDRAMINE HCL 25 MG PO CAPS
25.0000 mg | ORAL_CAPSULE | ORAL | Status: AC
  Administered 2017-07-24: 25 mg via ORAL

## 2017-07-24 NOTE — Interval H&P Note (Signed)
History and Physical Interval Note:  07/24/2017 8:04 AM  Zachary Gillespie  has presented today for surgery, with the diagnosis of KIdney stone  The various methods of treatment have been discussed with the patient and family. After consideration of risks, benefits and other options for treatment, the patient has consented to  Procedure(s): EXTRACORPOREAL SHOCK WAVE LITHOTRIPSY (ESWL) (Left) as a surgical intervention .  The patient's history has been reviewed, patient examined, no change in status, stable for surgery.  I have reviewed the patient's chart and labs.  Questions were answered to the patient's satisfaction.     [name]

## 2017-07-24 NOTE — Discharge Instructions (Signed)
See [facility] discharge instructions in chart.  

## 2017-07-29 ENCOUNTER — Telehealth: Payer: Self-pay | Admitting: Surgery

## 2017-07-29 NOTE — Telephone Encounter (Signed)
Pt advised of pre op date/time and sx date. Sx: 08/08/17 with Dr Maeola Sarah right inguinal hernia repair.  Pre op: 08/04/17 between 9-1:00-phone interview.   Patient made aware to call [phone], between 1-3:00pm the day before surgery, to find out what time to arrive.

## 2017-07-29 NOTE — Telephone Encounter (Signed)
See previous note

## 2017-08-04 ENCOUNTER — Encounter
Admission: RE | Admit: 2017-08-04 | Discharge: 2017-08-04 | Disposition: A | Discharge status: Home / Self Care | Source: Ambulatory Visit | Attending: Surgery | Admitting: Surgery

## 2017-08-04 NOTE — Patient Instructions (Addendum)
Your procedure is scheduled on: 08-08-17 FRIDAY Report to Same Day Surgery 2nd floor medical mall [facility] Entrance-take elevator on left to 2nd floor.  Check in with surgery information desk.) To find out your arrival time please call [phone] between 1PM - 3PM on 08-07-17 THURSDAY  Remember: Instructions that are not followed completely may result in serious medical risk, up to and including death, or upon the discretion of your surgeon and anesthesiologist your surgery may need to be rescheduled.    _x___ 1. Do not eat food after midnight the night before your procedure. You may drink WATER up to 2 hours before you are scheduled to arrive at the hospital for your procedure.  Do not drink WATER within 2 hours of your scheduled arrival to the hospital.   TYPE 1 AND TYPE 2 DIABETICS CAN ONLY DRINK WATER     __x__ 2. No Alcohol for 24 hours before or after surgery.   __x__3. No Smoking or e-cigarettes for 24 prior to surgery.  Do not use any chewable tobacco products for at least 6 hour prior to surgery   ____  4. Bring all medications with you on the day of surgery if instructed.    __x__ 5. Notify your doctor if there is any change in your medical condition     (cold, fever, infections).    x___6. On the morning of surgery brush your teeth with toothpaste and water.  You may rinse your mouth with mouth wash if you wish.  Do not swallow any toothpaste or mouthwash.   Do not wear jewelry, make-up, hairpins, clips or nail polish.  Do not wear lotions, powders, or perfumes. You may wear deodorant.  Do not shave 48 hours prior to surgery. Men may shave face and neck.  Do not bring valuables to the hospital.    [facility] is not responsible for any belongings or valuables.               Contacts, dentures or bridgework may not be worn into surgery.  Leave your suitcase in the car. After surgery it may be brought to your room.  For patients admitted to the hospital, discharge time  is determined by your treatment team.  _  Patients discharged the day of surgery will not be allowed to drive home.  You will need someone to drive you home and stay with you the night of your procedure.    Please read over the following fact sheets that you were given:   [facility] Preparing for Surgery   _x___ TAKE THE FOLLOWING MEDICATION THE MORNING OF SURGERY WITH A SMALL SIP OF WATER. These include:  1. LIPITOR (ATORVASTATIN)  2.CELEXA (ESCITALOPRAM)  3. PRILOSEC (OMEPRAZOLE)  4. TAKE A PRILOSEC THE NIGHT BEFORE YOUR SURGERY  5.  6.  ____Fleets enema or Magnesium Citrate as directed.   _x___ Use CHG Soap or sage wipes as directed on instruction sheet   ____ Use inhalers on the day of surgery and bring to hospital day of surgery  ____ Stop Metformin and Janumet 2 days prior to surgery.    ____ Take 1/2 of usual insulin dose the night before surgery and none on the morning surgery.   ____ Follow recommendations from Cardiologist, Pulmonologist or PCP regarding stopping Aspirin, Coumadin, Plavix ,Eliquis, Effient, or Pradaxa, and Pletal.  X____Stop Anti-inflammatories such as Advil, Aleve, Ibuprofen, Motrin, Naproxen, Naprosyn, Goodies powders or aspirin products NOW-OK to take Tylenol OR HYDROCODONE IF NEEDED-CELEBREX OK TO CONTINUE  _x___ Stop supplements until after surgery-STOP MELATONIN AND OMEGA 3-KRILL OIL NOW-MAY RESUME AFTER SURGERY   ____ Bring C-Pap to the hospital.

## 2017-08-05 ENCOUNTER — Ambulatory Visit (INDEPENDENT_AMBULATORY_CARE_PROVIDER_SITE_OTHER): Admitting: Urology

## 2017-08-05 ENCOUNTER — Encounter
Admission: RE | Admit: 2017-08-05 | Discharge: 2017-08-05 | Disposition: A | Discharge status: Home / Self Care | Source: Ambulatory Visit | Attending: Surgery | Admitting: Surgery

## 2017-08-05 ENCOUNTER — Ambulatory Visit
Admission: RE | Admit: 2017-08-05 | Discharge: 2017-08-05 | Disposition: A | Discharge status: Home / Self Care | Source: Ambulatory Visit | Attending: Urology | Admitting: Urology

## 2017-08-05 ENCOUNTER — Encounter: Payer: Self-pay | Admitting: Urology

## 2017-08-05 VITALS — BP 117/74 | HR 103 | Ht 69.0 in | Wt 263.0 lb

## 2017-08-05 DIAGNOSIS — I10 Essential (primary) hypertension: Secondary | ICD-10-CM | POA: Diagnosis not present

## 2017-08-05 DIAGNOSIS — N200 Calculus of kidney: Secondary | ICD-10-CM | POA: Diagnosis present

## 2017-08-05 DIAGNOSIS — R9431 Abnormal electrocardiogram [ECG] [EKG]: Secondary | ICD-10-CM | POA: Insufficient documentation

## 2017-08-05 NOTE — Progress Notes (Signed)
08/05/2017 1:46 PM   Zachary Gillespie 1965/06/10 [id number]  Referring provider: [name], MD [address] [facility] [place], [place] [address]  Chief Complaint  Patient presents with  . Routine Post Op    HPI: 51 year old male who underwent shockwave lithotripsy of a 3 x 5 x 8 mm left distal ureteral calculus by Dr. Erlene Quan on 07/24/2017.  He had no postoperative problems.  He brings in several small fragments today.  He is having intermittent mild left lower quadrant pain which is usually worse with straining.  He has no bothersome lower urinary tract symptoms and denies gross hematuria.  He remains on tamsulosin.  His CT did show bilateral, nonobstructing renal calculi.   PMH: Past Medical History:  Diagnosis Date  . DDD (degenerative disc disease), lumbar   . Diabetes mellitus without complication ([place])   . Dyslipidemia   . GERD (gastroesophageal reflux disease)   . History of hiatal hernia   . History of kidney stones    H/O  . HLD (hyperlipidemia)   . Hypertension   . Obesity   . Prediabetes   . Sciatica of left side   . Tubular adenoma of colon    2019 (Dr. Benson [place])    Surgical History: Past Surgical History:  Procedure Laterality Date  . COLONOSCOPY    . ESOPHAGOGASTRODUODENOSCOPY    . EXTRACORPOREAL SHOCK WAVE LITHOTRIPSY Left 07/24/2017   Procedure: EXTRACORPOREAL SHOCK WAVE LITHOTRIPSY (ESWL);  Surgeon: [name], MD;  Location: [facility] ORS;  Service: Urology;  Laterality: Left;  . EYE SURGERY    . TONSILLECTOMY    . VASECTOMY      Home Medications:  Allergies as of 08/05/2017      Reactions   Penicillins Anaphylaxis, Other (See Comments)   Has patient had a PCN reaction causing immediate rash, facial/tongue/throat swelling, SOB or lightheadedness with hypotension: Unknown Has patient had a PCN reaction causing severe rash involving mucus membranes or skin necrosis: No Has patient had a PCN reaction that required hospitalization: Yes Has  patient had a PCN reaction occurring within the last 10 years: No If all of the above answers are "NO", then may proceed with Cephalosporin use.   Keflex [cephalexin] Other (See Comments)   Childhood allergy   Sulfa Antibiotics Other (See Comments)   Childhood allergy      Medication List        Accurate as of 08/05/17  1:46 PM. Always use your most recent med list.          atorvastatin 20 MG tablet Commonly known as:  LIPITOR Take 1 tablet (20 mg total) by mouth daily.   celecoxib 200 MG capsule Commonly known as:  CELEBREX TAKE 1 CAPSULE DAILY   cetirizine 10 MG tablet Commonly known as:  ZYRTEC Take 10 mg by mouth daily as needed for allergies.   escitalopram 10 MG tablet Commonly known as:  LEXAPRO TAKE 1 TABLET DAILY   fluticasone 50 MCG/ACT nasal spray Commonly known as:  FLONASE Place 2 sprays into both nostrils daily as needed for allergies or rhinitis.   hydrochlorothiazide 25 MG tablet Commonly known as:  HYDRODIURIL Take 1 tablet (25 mg total) by mouth daily.   HYDROcodone-acetaminophen 5-325 MG tablet Commonly known as:  NORCO/VICODIN Take 1-2 tablets by mouth every 6 (six) hours as needed for moderate pain.   ibuprofen 200 MG tablet Commonly known as:  ADVIL,MOTRIN Take 800 mg by mouth every 6 (six) hours as needed for headache or moderate  pain.   Insulin Pen Needle 32G X 6 MM Misc Commonly known as:  BD PEN NEEDLE MICRO U/F Use as directed to inject Saxenda SQ QD.   Liraglutide -Weight Management 18 MG/3ML Sopn Commonly known as:  SAXENDA Inject 0.6 mg into the skin daily. Increase by 0.6mg  q week until max dose of 3mg  is reached   losartan 100 MG tablet Commonly known as:  COZAAR TAKE 1 TABLET DAILY   Melatonin 10 MG Tabs Take 10 mg by mouth at bedtime.   Omega-3 Krill Oil 500 MG Caps Take 500 mg by mouth daily.   omeprazole 40 MG capsule Commonly known as:  PRILOSEC TAKE 1 CAPSULE DAILY   ONE-A-DAY MENS HEALTH FORMULA PO Take 1  tablet by mouth daily.   oxyCODONE-acetaminophen 5-325 MG tablet Commonly known as:  PERCOCET/ROXICET Take 1 tablet by mouth every 6 (six) hours as needed for severe pain.   tamsulosin 0.4 MG Caps capsule Commonly known as:  FLOMAX Take 1 capsule (0.4 mg total) by mouth daily.       Allergies:  Allergies  Allergen Reactions  . Penicillins Anaphylaxis and Other (See Comments)    Has patient had a PCN reaction causing immediate rash, facial/tongue/throat swelling, SOB or lightheadedness with hypotension: Unknown Has patient had a PCN reaction causing severe rash involving mucus membranes or skin necrosis: No Has patient had a PCN reaction that required hospitalization: Yes Has patient had a PCN reaction occurring within the last 10 years: No If all of the above answers are "NO", then may proceed with Cephalosporin use.   [place] Keflex [Cephalexin] Other (See Comments)    Childhood allergy  . Sulfa Antibiotics Other (See Comments)    Childhood allergy    Family History: Family History  Problem Relation Age of Onset  . Heart disease Father   . Diabetes Father   . Hyperlipidemia Father   . Diabetes Sister   . Heart disease Paternal Grandfather   . Bladder Cancer Neg Hx   . Kidney cancer Neg Hx   . Prostate cancer Neg Hx     Social History:  reports that he has never smoked. He has never used smokeless tobacco. He reports that he drinks alcohol. He reports that he does not use drugs.  ROS: UROLOGY Frequent Urination?: No Hard to postpone urination?: No Burning/pain with urination?: No Get up at night to urinate?: Yes Leakage of urine?: No Urine stream starts and stops?: No Trouble starting stream?: No Do you have to strain to urinate?: No Blood in urine?: No Urinary tract infection?: No Sexually transmitted disease?: No Injury to kidneys or bladder?: No Painful intercourse?: No Weak stream?: No Erection problems?: No Penile pain?: No  Gastrointestinal Nausea?:  No Vomiting?: No Indigestion/heartburn?: No Diarrhea?: Yes Constipation?: No  Constitutional Fever: No Night sweats?: No Weight loss?: No Fatigue?: No  Skin Skin rash/lesions?: No Itching?: No  Eyes Blurred vision?: No Double vision?: No  Ears/Nose/Throat Sore throat?: No Sinus problems?: No  Hematologic/Lymphatic Swollen glands?: No Easy bruising?: No  Cardiovascular Leg swelling?: No Chest pain?: No  Respiratory Cough?: No Shortness of breath?: No  Endocrine Excessive thirst?: No  Musculoskeletal Back pain?: Yes Joint pain?: No  Neurological Headaches?: No Dizziness?: No  Psychologic Depression?: No Anxiety?: No  Physical Exam: BP 117/74 (BP Location: Left Arm, Patient Position: Sitting, Cuff Size: Large)   Pulse (!) 103   Ht 5\' 9"  (1.753 m)   Wt 263 lb (119.3 kg)   BMI 38.84 kg/m  Constitutional:  Alert and oriented, No acute distress.   Pertinent Imaging: KUB performed today was reviewed and the previously identified left distal calculus is not identified.  Bilateral nephrolithiasis noted.   Assessment & Plan:   Stone fragments will be sent for analysis.  He has mild left lower quadrant discomfort although no remaining fragments are seen on KUB today.  Will schedule a renal ultrasound to evaluate for hydronephrosis.  I have recommended a metabolic evaluation to include a 24 urine study to be performed in approximately 6 weeks.   [name], [place] [address] [place], [place] [address] [phone]

## 2017-08-07 ENCOUNTER — Telehealth: Payer: Self-pay | Admitting: Surgery

## 2017-08-07 MED ORDER — CIPROFLOXACIN IN D5W 400 MG/200ML IV SOLN
400.0000 mg | INTRAVENOUS | Status: AC
  Administered 2017-08-08: 400 mg via INTRAVENOUS

## 2017-08-07 NOTE — Telephone Encounter (Signed)
Patient is calling to let us know that he has had some loose stools the past few days. He denies fever or chills. He thinks maybe it is something he has eaten.   He wanted to be sure that he could still have the surgery tomorrow and was told yes.

## 2017-08-07 NOTE — Telephone Encounter (Signed)
Patient has called and left a message on voicemail. He stated that he would like to speak with a nurse about some current medical issues that just came up. Patient is scheduled for surgery tomorrow with Dr Burt Knack for hernia repair.

## 2017-08-08 ENCOUNTER — Encounter
Admission: RE | Disposition: A | Discharge status: Home / Self Care | Payer: Self-pay | Source: Ambulatory Visit | Attending: Surgery

## 2017-08-08 ENCOUNTER — Encounter: Payer: Self-pay | Admitting: Certified Registered Nurse Anesthetist

## 2017-08-08 ENCOUNTER — Ambulatory Visit
Admission: RE | Admit: 2017-08-08 | Discharge: 2017-08-08 | Disposition: A | Discharge status: Home / Self Care | Source: Ambulatory Visit | Attending: Surgery | Admitting: Surgery

## 2017-08-08 ENCOUNTER — Ambulatory Visit: Admitting: Anesthesiology

## 2017-08-08 ENCOUNTER — Other Ambulatory Visit: Payer: Self-pay

## 2017-08-08 DIAGNOSIS — Z79899 Other long term (current) drug therapy: Secondary | ICD-10-CM | POA: Insufficient documentation

## 2017-08-08 DIAGNOSIS — E785 Hyperlipidemia, unspecified: Secondary | ICD-10-CM | POA: Diagnosis not present

## 2017-08-08 DIAGNOSIS — K219 Gastro-esophageal reflux disease without esophagitis: Secondary | ICD-10-CM | POA: Insufficient documentation

## 2017-08-08 DIAGNOSIS — Z791 Long term (current) use of non-steroidal anti-inflammatories (NSAID): Secondary | ICD-10-CM | POA: Insufficient documentation

## 2017-08-08 DIAGNOSIS — K4090 Unilateral inguinal hernia, without obstruction or gangrene, not specified as recurrent: Secondary | ICD-10-CM | POA: Diagnosis present

## 2017-08-08 DIAGNOSIS — D176 Benign lipomatous neoplasm of spermatic cord: Secondary | ICD-10-CM | POA: Diagnosis not present

## 2017-08-08 DIAGNOSIS — E669 Obesity, unspecified: Secondary | ICD-10-CM | POA: Insufficient documentation

## 2017-08-08 DIAGNOSIS — I10 Essential (primary) hypertension: Secondary | ICD-10-CM | POA: Insufficient documentation

## 2017-08-08 DIAGNOSIS — E119 Type 2 diabetes mellitus without complications: Secondary | ICD-10-CM | POA: Diagnosis not present

## 2017-08-08 DIAGNOSIS — Z6838 Body mass index (BMI) 38.0-38.9, adult: Secondary | ICD-10-CM | POA: Diagnosis not present

## 2017-08-08 HISTORY — PX: INGUINAL HERNIA REPAIR: SHX194

## 2017-08-08 SURGERY — REPAIR, HERNIA, INGUINAL, LAPAROSCOPIC
Anesthesia: General | Laterality: Right

## 2017-08-08 MED ORDER — MIDAZOLAM HCL 2 MG/2ML IJ SOLN
INTRAMUSCULAR | Status: AC
  Filled 2017-08-08: qty 2

## 2017-08-08 MED ORDER — PHENYLEPHRINE HCL 10 MG/ML IJ SOLN
INTRAMUSCULAR | Status: DC | PRN
  Administered 2017-08-08 (×2): 100 ug via INTRAVENOUS

## 2017-08-08 MED ORDER — ROCURONIUM BROMIDE 100 MG/10ML IV SOLN
INTRAVENOUS | Status: DC | PRN
  Administered 2017-08-08: 5 mg via INTRAVENOUS
  Administered 2017-08-08: 35 mg via INTRAVENOUS

## 2017-08-08 MED ORDER — MEPERIDINE HCL 50 MG/ML IJ SOLN: 6.2500 mg | INTRAMUSCULAR | Status: DC | PRN

## 2017-08-08 MED ORDER — BUPIVACAINE-EPINEPHRINE (PF) 0.25% -1:200000 IJ SOLN
INTRAMUSCULAR | Status: DC | PRN
  Administered 2017-08-08: 30 mL

## 2017-08-08 MED ORDER — FENTANYL CITRATE (PF) 100 MCG/2ML IJ SOLN
25.0000 ug | INTRAMUSCULAR | Status: DC | PRN
  Administered 2017-08-08 (×3): 50 ug via INTRAVENOUS

## 2017-08-08 MED ORDER — PROMETHAZINE HCL 25 MG/ML IJ SOLN: 6.2500 mg | INTRAMUSCULAR | Status: DC | PRN

## 2017-08-08 MED ORDER — PROPOFOL 10 MG/ML IV BOLUS
INTRAVENOUS | Status: DC | PRN
  Administered 2017-08-08: 200 mg via INTRAVENOUS

## 2017-08-08 MED ORDER — PROPOFOL 10 MG/ML IV BOLUS
INTRAVENOUS | Status: AC
  Filled 2017-08-08: qty 20

## 2017-08-08 MED ORDER — FENTANYL CITRATE (PF) 100 MCG/2ML IJ SOLN
INTRAMUSCULAR | Status: AC
  Administered 2017-08-08: 50 ug via INTRAVENOUS
  Filled 2017-08-08: qty 2

## 2017-08-08 MED ORDER — FENTANYL CITRATE (PF) 100 MCG/2ML IJ SOLN
INTRAMUSCULAR | Status: AC
  Filled 2017-08-08: qty 2

## 2017-08-08 MED ORDER — SODIUM CHLORIDE 0.9 % IV SOLN
INTRAVENOUS | Status: DC
  Administered 2017-08-08: 12:00:00 via INTRAVENOUS

## 2017-08-08 MED ORDER — CIPROFLOXACIN IN D5W 400 MG/200ML IV SOLN
INTRAVENOUS | Status: AC
  Filled 2017-08-08: qty 200

## 2017-08-08 MED ORDER — LIDOCAINE HCL (PF) 2 % IJ SOLN
INTRAMUSCULAR | Status: AC
  Filled 2017-08-08: qty 10

## 2017-08-08 MED ORDER — PHENYLEPHRINE HCL 10 MG/ML IJ SOLN
INTRAMUSCULAR | Status: AC
  Filled 2017-08-08: qty 1

## 2017-08-08 MED ORDER — ONDANSETRON HCL 4 MG/2ML IJ SOLN
INTRAMUSCULAR | Status: DC | PRN
  Administered 2017-08-08: 4 mg via INTRAVENOUS

## 2017-08-08 MED ORDER — CHLORHEXIDINE GLUCONATE CLOTH 2 % EX PADS: 6.0000 | MEDICATED_PAD | Freq: Once | CUTANEOUS | Status: DC

## 2017-08-08 MED ORDER — BUPIVACAINE-EPINEPHRINE (PF) 0.25% -1:200000 IJ SOLN
INTRAMUSCULAR | Status: AC
  Filled 2017-08-08: qty 30

## 2017-08-08 MED ORDER — SUGAMMADEX SODIUM 200 MG/2ML IV SOLN
INTRAVENOUS | Status: AC
  Filled 2017-08-08: qty 2

## 2017-08-08 MED ORDER — SUCCINYLCHOLINE CHLORIDE 20 MG/ML IJ SOLN
INTRAMUSCULAR | Status: DC | PRN
  Administered 2017-08-08: 120 mg via INTRAVENOUS

## 2017-08-08 MED ORDER — DEXAMETHASONE SODIUM PHOSPHATE 10 MG/ML IJ SOLN
INTRAMUSCULAR | Status: DC | PRN
  Administered 2017-08-08: 10 mg via INTRAVENOUS

## 2017-08-08 MED ORDER — SUGAMMADEX SODIUM 200 MG/2ML IV SOLN
INTRAVENOUS | Status: DC | PRN
  Administered 2017-08-08: 500 mg via INTRAVENOUS

## 2017-08-08 MED ORDER — FENTANYL CITRATE (PF) 100 MCG/2ML IJ SOLN
INTRAMUSCULAR | Status: DC | PRN
  Administered 2017-08-08: 50 ug via INTRAVENOUS
  Administered 2017-08-08: 100 ug via INTRAVENOUS

## 2017-08-08 MED ORDER — OXYCODONE HCL 5 MG/5ML PO SOLN: 5.0000 mg | Freq: Once | ORAL | Status: AC | PRN

## 2017-08-08 MED ORDER — HYDROMORPHONE HCL 1 MG/ML IJ SOLN
INTRAMUSCULAR | Status: AC
  Administered 2017-08-08: 0.25 mg via INTRAVENOUS
  Filled 2017-08-08: qty 1

## 2017-08-08 MED ORDER — OXYCODONE HCL 5 MG PO TABS
5.0000 mg | ORAL_TABLET | Freq: Once | ORAL | Status: AC | PRN
  Administered 2017-08-08: 5 mg via ORAL

## 2017-08-08 MED ORDER — LIDOCAINE HCL (CARDIAC) PF 100 MG/5ML IV SOSY
PREFILLED_SYRINGE | INTRAVENOUS | Status: DC | PRN
  Administered 2017-08-08: 100 mg via INTRAVENOUS

## 2017-08-08 MED ORDER — MIDAZOLAM HCL 2 MG/2ML IJ SOLN
INTRAMUSCULAR | Status: DC | PRN
  Administered 2017-08-08: 2 mg via INTRAVENOUS

## 2017-08-08 MED ORDER — DEXAMETHASONE SODIUM PHOSPHATE 10 MG/ML IJ SOLN
INTRAMUSCULAR | Status: AC
  Filled 2017-08-08: qty 1

## 2017-08-08 MED ORDER — HEPARIN SODIUM (PORCINE) 5000 UNIT/ML IJ SOLN
5000.0000 [IU] | Freq: Once | INTRAMUSCULAR | Status: AC
  Administered 2017-08-08: 5000 [IU] via SUBCUTANEOUS

## 2017-08-08 MED ORDER — OXYCODONE HCL 5 MG PO TABS
ORAL_TABLET | ORAL | Status: AC
  Filled 2017-08-08: qty 1

## 2017-08-08 MED ORDER — ROCURONIUM BROMIDE 100 MG/10ML IV SOLN
INTRAVENOUS | Status: AC
Start: 2017-08-08 — End: ?
  Filled 2017-08-08: qty 1

## 2017-08-08 MED ORDER — ONDANSETRON HCL 4 MG/2ML IJ SOLN
INTRAMUSCULAR | Status: AC
  Filled 2017-08-08: qty 2

## 2017-08-08 MED ORDER — KETOROLAC TROMETHAMINE 30 MG/ML IJ SOLN
INTRAMUSCULAR | Status: AC
  Filled 2017-08-08: qty 1

## 2017-08-08 MED ORDER — HEPARIN SODIUM (PORCINE) 5000 UNIT/ML IJ SOLN
INTRAMUSCULAR | Status: AC
  Filled 2017-08-08: qty 1

## 2017-08-08 MED ORDER — HYDROMORPHONE HCL 1 MG/ML IJ SOLN
0.2500 mg | INTRAMUSCULAR | Status: DC | PRN
  Administered 2017-08-08 (×2): 0.25 mg via INTRAVENOUS
  Administered 2017-08-08: 0.5 mg via INTRAVENOUS

## 2017-08-08 SURGICAL SUPPLY — 33 items
ADHESIVE MASTISOL STRL (MISCELLANEOUS) ×2 IMPLANT
BLADE CLIPPER SURG (BLADE) ×2 IMPLANT
BLADE SURG SZ11 CARB STEEL (BLADE) ×2 IMPLANT
CHLORAPREP W/TINT 26ML (MISCELLANEOUS) ×2 IMPLANT
DISSECT BALLN SPACEMKR OVL PDB (BALLOONS) ×2
DISSECT BALLN SPACEMKR RND PDB (MISCELLANEOUS)
DISSECTOR BALLN SPCMKR OVL PDB (BALLOONS) ×1 IMPLANT
DISSECTOR BALLN SPCMKR RND PDB (MISCELLANEOUS) IMPLANT
GLOVE BIO SURGEON STRL SZ8 (GLOVE) ×2 IMPLANT
GOWN STRL REUS W/ TWL LRG LVL3 (GOWN DISPOSABLE) ×2 IMPLANT
GOWN STRL REUS W/TWL LRG LVL3 (GOWN DISPOSABLE) ×2
IRRIGATION STRYKERFLOW (MISCELLANEOUS) IMPLANT
IRRIGATOR STRYKERFLOW (MISCELLANEOUS)
LABEL OR SOLS (LABEL) ×2 IMPLANT
MESH HERNIA 4X6 PROLITE RECT (Mesh General) ×1 IMPLANT
MESH POLY 4X6 (Mesh General) ×1 IMPLANT
NEEDLE HYPO 22GX1.5 SAFETY (NEEDLE) ×2 IMPLANT
NS IRRIG 500ML POUR BTL (IV SOLUTION) ×2 IMPLANT
PACK LAP CHOLECYSTECTOMY (MISCELLANEOUS) ×2 IMPLANT
SPONGE GAUZE 2X2 8PLY STRL LF (GAUZE/BANDAGES/DRESSINGS) ×4 IMPLANT
SPONGE LAP 18X18 RF (DISPOSABLE) ×2 IMPLANT
STRIP CLOSURE SKIN 1/2X4 (GAUZE/BANDAGES/DRESSINGS) ×2 IMPLANT
SURGILUBE 2OZ TUBE FLIPTOP (MISCELLANEOUS) ×2 IMPLANT
SUT ETHIBOND 0 (SUTURE) ×2 IMPLANT
SUT MNCRL 4-0 (SUTURE) ×2
SUT MNCRL 4-0 27XMFL (SUTURE) ×2
SUT VICRYL 0 AB UR-6 (SUTURE) ×2 IMPLANT
SUTURE MNCRL 4-0 27XMF (SUTURE) ×2 IMPLANT
TACKER 5MM HERNIA 3.5CML NAB (ENDOMECHANICALS) ×2 IMPLANT
TRAY FOLEY MTR SLVR 16FR STAT (SET/KITS/TRAYS/PACK) ×2 IMPLANT
TROCAR 5MM SINGLE VERSAONE (TROCAR) ×4 IMPLANT
TROCAR BALLN 10M OMST10SB SPAC (TROCAR) ×2 IMPLANT
TUBING INSUFFLATION (TUBING) ×2 IMPLANT

## 2017-08-08 NOTE — Anesthesia Postprocedure Evaluation (Signed)
Anesthesia Post Note  Patient: [name]  Procedure(s) Performed: LAPAROSCOPIC INGUINAL HERNIA (Right )  Patient location during evaluation: PACU Anesthesia Type: General Level of consciousness: awake and alert Pain management: pain level controlled Vital Signs Assessment: post-procedure vital signs reviewed and stable Respiratory status: spontaneous breathing, nonlabored ventilation, respiratory function stable and patient connected to nasal cannula oxygen Cardiovascular status: blood pressure returned to baseline and stable Postop Assessment: no apparent nausea or vomiting Anesthetic complications: no     Last Vitals:  Vitals:   08/08/17 1517 08/08/17 1552  BP: 128/75 114/69  Pulse: 83 85  Resp: 16   Temp: (!) 36 C   SpO2: 94% 93%    Last Pain:  Vitals:   08/08/17 1552  TempSrc:   PainSc: 2                  Tamim Skog S

## 2017-08-08 NOTE — Anesthesia Post-op Follow-up Note (Signed)
Anesthesia QCDR form completed.        

## 2017-08-08 NOTE — Anesthesia Preprocedure Evaluation (Signed)
Anesthesia Evaluation  Patient identified by MRN, date of birth, ID band Patient awake    Reviewed: Allergy & Precautions, NPO status , Patient's Chart, lab work & pertinent test results  History of Anesthesia Complications Negative for: history of anesthetic complications  Airway Mallampati: III  TM Distance: >3 FB Neck ROM: Full    Dental no notable dental hx.    Pulmonary neg pulmonary ROS, neg sleep apnea, neg COPD,    breath sounds clear to auscultation- rhonchi (-) wheezing      Cardiovascular Exercise Tolerance: Good hypertension, Pt. on medications (-) CAD, (-) Past MI, (-) Cardiac Stents and (-) CABG  Rhythm:Regular Rate:Normal - Systolic murmurs and - Diastolic murmurs    Neuro/Psych negative neurological ROS  negative psych ROS   GI/Hepatic Neg liver ROS, hiatal hernia, GERD  ,  Endo/Other  diabetes  Renal/GU Renal disease: hx of nephrolithiasis.     Musculoskeletal  (+) Arthritis ,   Abdominal (+) + obese,   Peds  Hematology negative hematology ROS (+)   Anesthesia Other Findings Past Medical History: No date: DDD (degenerative disc disease), lumbar No date: Diabetes mellitus without complication (HCC) No date: Dyslipidemia No date: GERD (gastroesophageal reflux disease) No date: History of hiatal hernia No date: History of kidney stones     Comment:  H/O No date: HLD (hyperlipidemia) No date: Hypertension No date: Obesity No date: Prediabetes No date: Sciatica of left side No date: Tubular adenoma of colon     Comment:  2019 (Dr. Benson Norway)   Reproductive/Obstetrics                             Anesthesia Physical Anesthesia Plan  ASA: II  Anesthesia Plan: General   Post-op Pain Management:    Induction: Intravenous  PONV Risk Score and Plan: 1 and Ondansetron and Midazolam  Airway Management Planned: Oral ETT  Additional Equipment:   Intra-op Plan:    Post-operative Plan: Extubation in OR  Informed Consent: I have reviewed the patients History and Physical, chart, labs and discussed the procedure including the risks, benefits and alternatives for the proposed anesthesia with the patient or authorized representative who has indicated his/her understanding and acceptance.   Dental advisory given  Plan Discussed with: CRNA and Anesthesiologist  Anesthesia Plan Comments:         Anesthesia Quick Evaluation

## 2017-08-08 NOTE — Progress Notes (Signed)
Dr Burt Knack into see

## 2017-08-08 NOTE — H&P (Signed)
Zachary Gillespie is an 52 y.o. male.    Chief Complaint: Right inguinal hernia  HPI: This patient with a right inguinal hernia here for elective repair.  He was seen in the office where diagnosis was made and studies were reviewed.  Past Medical History:  Diagnosis Date  . DDD (degenerative disc disease), lumbar   . Diabetes mellitus without complication ([place])   . Dyslipidemia   . GERD (gastroesophageal reflux disease)   . History of hiatal hernia   . History of kidney stones    H/O  . HLD (hyperlipidemia)   . Hypertension   . Obesity   . Prediabetes   . Sciatica of left side   . Tubular adenoma of colon    2019 (Dr. Benson Norway)    Past Surgical History:  Procedure Laterality Date  . COLONOSCOPY    . ESOPHAGOGASTRODUODENOSCOPY    . EXTRACORPOREAL SHOCK WAVE LITHOTRIPSY Left 07/24/2017   Procedure: EXTRACORPOREAL SHOCK WAVE LITHOTRIPSY (ESWL);  Surgeon: [name], MD;  Location: [facility] ORS;  Service: Urology;  Laterality: Left;  . EYE SURGERY    . TONSILLECTOMY    . VASECTOMY      Family History  Problem Relation Age of Onset  . Heart disease Father   . Diabetes Father   . Hyperlipidemia Father   . Diabetes Sister   . Heart disease Paternal Grandfather   . Bladder Cancer Neg Hx   . Kidney cancer Neg Hx   . Prostate cancer Neg Hx    Social History:  reports that he has never smoked. He has never used smokeless tobacco. He reports that he drinks alcohol. He reports that he does not use drugs.  Allergies:  Allergies  Allergen Reactions  . Penicillins Anaphylaxis and Other (See Comments)    Has patient had a PCN reaction causing immediate rash, facial/tongue/throat swelling, SOB or lightheadedness with hypotension: Unknown Has patient had a PCN reaction causing severe rash involving mucus membranes or skin necrosis: No Has patient had a PCN reaction that required hospitalization: Yes Has patient had a PCN reaction occurring within the last 10 years: No If all of the  above answers are "NO", then may proceed with Cephalosporin use.   Marland Kitchen Keflex [Cephalexin] Other (See Comments)    Childhood allergy  . Sulfa Antibiotics Other (See Comments)    Childhood allergy    Medications Prior to Admission  Medication Sig Dispense Refill  . atorvastatin (LIPITOR) 20 MG tablet Take 1 tablet (20 mg total) by mouth daily. (Patient taking differently: Take 20 mg by mouth every morning. ) 90 tablet 3  . celecoxib (CELEBREX) 200 MG capsule TAKE 1 CAPSULE DAILY 90 capsule 3  . cetirizine (ZYRTEC) 10 MG tablet Take 10 mg by mouth daily as needed for allergies.    Marland Kitchen escitalopram (LEXAPRO) 10 MG tablet TAKE 1 TABLET DAILY (Patient taking differently: TAKE 1 TABLET DAILY-AM) 90 tablet 3  . fluticasone (FLONASE) 50 MCG/ACT nasal spray Place 2 sprays into both nostrils daily as needed for allergies or rhinitis.    . hydrochlorothiazide (HYDRODIURIL) 25 MG tablet Take 1 tablet (25 mg total) by mouth daily. 90 tablet 3  . HYDROcodone-acetaminophen (NORCO/VICODIN) 5-325 MG tablet Take 1-2 tablets by mouth every 6 (six) hours as needed for moderate pain. 10 tablet 0  . ibuprofen (ADVIL,MOTRIN) 200 MG tablet Take 800 mg by mouth every 6 (six) hours as needed for headache or moderate pain.    . Liraglutide -Weight Management (SAXENDA) 18 MG/3ML SOPN Inject 0.6  mg into the skin daily. Increase by 0.6mg  q week until max dose of 3mg  is reached (Patient taking differently: Inject 3 mg into the skin daily. ) 10 pen 2  . losartan (COZAAR) 100 MG tablet TAKE 1 TABLET DAILY (Patient taking differently: TAKE 1 TABLET DAILY-PM) 90 tablet 3  . Melatonin 10 MG TABS Take 10 mg by mouth at bedtime.     . Multiple Vitamins-Minerals (ONE-A-DAY MENS HEALTH FORMULA PO) Take 1 tablet by mouth daily.    . Omega-3 Krill Oil 500 MG CAPS Take 500 mg by mouth daily.     Marland Kitchen omeprazole (PRILOSEC) 40 MG capsule TAKE 1 CAPSULE DAILY (Patient taking differently: TAKE 1 CAPSULE DAILY-AM) 90 capsule 3  . tamsulosin  (FLOMAX) 0.4 MG CAPS capsule Take 1 capsule (0.4 mg total) by mouth daily. (Patient taking differently: Take 0.4 mg by mouth daily after supper. ) 30 capsule 0  . Insulin Pen Needle (BD PEN NEEDLE MICRO U/F) 32G X 6 MM MISC Use as directed to inject Saxenda SQ QD. 100 each 1  . oxyCODONE-acetaminophen (PERCOCET/ROXICET) 5-325 MG tablet Take 1 tablet by mouth every 6 (six) hours as needed for severe pain. 15 tablet 0     Review of Systems  All other systems reviewed and are negative.    Physical Exam:  BP 110/77   Pulse 84   Temp 98.5 F (36.9 C) (Oral)   Resp 20   Ht 5\' 9"  (1.753 m)   Wt 263 lb (119.3 kg)   SpO2 95%   BMI 38.84 kg/m   Physical Exam  Constitutional: He is oriented to person, place, and time. He appears well-developed and well-nourished.  Eyes: EOM are normal. Right eye exhibits no discharge. Left eye exhibits no discharge.  Neck: Neck supple.  Cardiovascular: Normal rate and regular rhythm.  Pulmonary/Chest: Effort normal. No respiratory distress.  Abdominal: Soft. He exhibits no distension. There is no tenderness. There is no guarding.  Genitourinary: Penis normal.  Genitourinary Comments: Right inguinal hernia normal testicle  Patient marked on the right  Musculoskeletal: Normal range of motion. He exhibits no edema.  Neurological: He is alert and oriented to person, place, and time.  Skin: Skin is warm and dry.        No results found for this or any previous visit (from the past 48 hour(s)). No results found.   Assessment/Plan  This patient with a right inguinal hernia he is requesting elective repair.  Preoperatively discussed rationale for surgery the options of observation risk of bleeding infection recurrence ischemic orchitis chronic pain syndrome and neuroma.  This is all reviewed for he and his wife in the preop holding area he was marked as well.  He understood and agreed to proceed.  [name], MD, FACS

## 2017-08-08 NOTE — Transfer of Care (Signed)
Immediate Anesthesia Transfer of Care Note  Patient: Zachary Gillespie  Procedure(s) Performed: LAPAROSCOPIC INGUINAL HERNIA (Right )  Patient Location: PACU  Anesthesia Type:General  Level of Consciousness: awake, alert  and oriented  Airway & Oxygen Therapy: Patient Spontanous Breathing and Patient connected to face mask oxygen  Post-op Assessment: Report given to RN and Post -op Vital signs reviewed and stable  Post vital signs: Reviewed and stable  Last Vitals:  Vitals Value Taken Time  BP 125/83 08/08/2017  1:51 PM  Temp    Pulse 98 08/08/2017  1:51 PM  Resp 17 08/08/2017  1:51 PM  SpO2 87 % 08/08/2017  1:51 PM  Vitals shown include unvalidated device data.  Last Pain:  Vitals:   08/08/17 1127  TempSrc: Oral  PainSc: 0-No pain         Complications: No apparent anesthesia complications

## 2017-08-08 NOTE — Anesthesia Procedure Notes (Addendum)
Procedure Name: Intubation Performed by: Demetrius Charity, CRNA Pre-anesthesia Checklist: Patient identified, Patient being monitored, Timeout performed, Emergency Drugs available and Suction available Patient Re-evaluated:Patient Re-evaluated prior to induction Oxygen Delivery Method: Circle system utilized Preoxygenation: Pre-oxygenation with 100% oxygen Induction Type: IV induction Ventilation: Mask ventilation without difficulty and Oral airway inserted - appropriate to patient size Laryngoscope Size: McGraph and 4 Grade View: Grade II Tube type: Oral Tube size: 7.0 mm Number of attempts: 1 Airway Equipment and Method: Stylet Placement Confirmation: ETT inserted through vocal cords under direct vision,  positive ETCO2 and breath sounds checked- equal and bilateral Secured at: 23 cm Tube secured with: Tape Dental Injury: Teeth and Oropharynx as per pre-operative assessment

## 2017-08-08 NOTE — Op Note (Signed)
Laparoscopic Inguinal Hernia Repair  Zachary Gillespie  08/08/2017  Pre-operative Diagnosis: Right inguinal Hernia  Post-operative Diagnosis: Right inguinal hernia  Procedure: Laparoscopic preperitoneal repair of right inguinal hernia   Surgeon: Jerrol Banana. Burt Knack, MD FACS  Anesthesia: Gen. with endotracheal tube  Assistant: Surgical tech  Procedure Details  The patient was seen again in the Holding Room. The benefits, complications, treatment options, and expected outcomes were discussed with the patient. The risks of bleeding, infection, recurrence of symptoms, failure to resolve symptoms, recurrence of hernia, ischemic orchitis, chronic pain syndrome or neuroma, were discussed again. The likelihood of improving the patient's symptoms with return to their baseline status is good.  The patient and/or family concurred with the proposed plan, giving informed consent.  The patient was taken to Operating Room, identified as Zachary Gillespie and the procedure verified as Laparoscopic Inguinal Hernia Repair. Laterality confirmed.  A Time Out was held and the above information confirmed.  Prior to the induction of general anesthesia, antibiotic prophylaxis was administered. VTE prophylaxis was in place. General endotracheal anesthesia was then administered and tolerated well. A Foley catheter was placed by the nursing staff. After the induction, the abdomen was prepped with Chloraprep and draped in the sterile fashion. The patient was positioned in the supine position.  Local anesthetic  was injected into the skin near the umbilicus and an incision made. An incision was made and dissection down to the rectus fascia was performed. The fascia was incised and the muscle retracted laterally.  This was made quite difficult due to the patient's obesity and the depth of his abdominal wall.  The Covidien dissecting balloon was placed followed by the structural balloon.  Upon doing this it was noted that the  space that had been developed was in the subcutaneous tissue and not below the fascia.  The balloon was removed and another attempt at entering the preperitoneal space was performed.  The dissecting balloon was placed followed by the structural balloon and this time the preperitoneal space had been properly entered.  The preperitoneal space was insufflated and under direct vision 2 midline 5 mm ports were placed.  Dissection was performed to delineate Livi Mcgann's ligament and the lateral extent of dissection was determined. The nerve on the lateral abdominal wall was identified and kept in view at all times. The cord was skeletonized of the indirect sac and cord lipoma which was retracted cephalad.  The indirect sac was quite large but the floor was strong.  Once this was complete, a laterally scissored Atrium mesh was placed into the preperitoneal space. It was held in place with the Covidien tacking device avoiding the area of the nerve. Once assuring that the hernia was completely repaired and adequately covered, the preperitoneal space was desufflated under direct vision. There was no sign of peritoneal rent and no sign of bowel intrusion towards the mesh.  Once assuring that hemostasis was adequate the ports were removed and a figure-of-eight 0 Vicryl suture was placed at the fascial edges. 4-0 subcuticular Monocryl was used at all skin edges. Steri-Strips and Mastisol and sterile dressings were placed.  Patient tolerated the procedure well. There were no complications. He was taken to the recovery room in stable condition to be discharged to the care of his family and follow-up in 10 days.    Findings: Initial entry into the preperitoneal space was difficult as above.  See description as above.  The hernia itself was an indirect hernia which was quite large with a strong floor  and a small cord lipoma.                    Bartley E. Burt Knack, MD, FACS

## 2017-08-08 NOTE — Discharge Instructions (Signed)
Remove dressing in 24 hours. °May shower in 24 hours. °Leave paper strips in place. °Resume all home medications. °Follow-up with Dr. Lior Hoen in 10 days. °

## 2017-08-11 ENCOUNTER — Telehealth: Payer: Self-pay

## 2017-08-11 NOTE — Telephone Encounter (Signed)
FMLA paperwork faxed and patient notified. Placed in scan folder.

## 2017-08-15 ENCOUNTER — Other Ambulatory Visit: Payer: Self-pay | Admitting: Urology

## 2017-08-16 ENCOUNTER — Other Ambulatory Visit: Payer: Self-pay | Admitting: Family Medicine

## 2017-08-16 DIAGNOSIS — I10 Essential (primary) hypertension: Secondary | ICD-10-CM

## 2017-08-18 ENCOUNTER — Ambulatory Visit (INDEPENDENT_AMBULATORY_CARE_PROVIDER_SITE_OTHER): Admitting: Surgery

## 2017-08-18 ENCOUNTER — Encounter: Payer: Self-pay | Admitting: Surgery

## 2017-08-18 VITALS — BP 122/81 | HR 97 | Temp 97.8°F | Wt 255.0 lb

## 2017-08-18 DIAGNOSIS — K4090 Unilateral inguinal hernia, without obstruction or gangrene, not specified as recurrent: Secondary | ICD-10-CM

## 2017-08-18 NOTE — Progress Notes (Signed)
Outpatient postop visit  08/18/2017  Zachary Gillespie is an 52 y.o. male.    Procedure: Lap scopic inguinal hernia repair, right  CC: Minimal pain  HPI: Patient status post laparoscopic inguinal hernia repair on the right.  He has some numbness and some minimal pain.  No drainage no ecchymosis  Medications reviewed.    Physical Exam:  BP 122/81   Pulse 97   Temp 97.8 F (36.6 C) (Oral)   Wt 255 lb (115.7 kg)   BMI 37.66 kg/m     PE: Wounds are healing well no erythema no drainage no ecchymosis testicle normal.    Assessment/Plan:  Patient doing very well recommend follow-up on an as-needed basis.  He is reminded that he can do any lifting he needs to once the pain is gone.  [name], MD, FACS

## 2017-08-18 NOTE — Patient Instructions (Addendum)

## 2017-08-21 ENCOUNTER — Ambulatory Visit (INDEPENDENT_AMBULATORY_CARE_PROVIDER_SITE_OTHER): Admitting: Family Medicine

## 2017-08-21 ENCOUNTER — Encounter: Payer: Self-pay | Admitting: Family Medicine

## 2017-08-21 VITALS — BP 110/74 | HR 88 | Temp 98.2°F | Resp 18 | Ht 69.0 in | Wt 258.0 lb

## 2017-08-21 DIAGNOSIS — R7303 Prediabetes: Secondary | ICD-10-CM | POA: Diagnosis not present

## 2017-08-21 NOTE — Progress Notes (Signed)
Subjective:     Patient ID: Zachary Gillespie, male   DOB: 1965-05-28, 52 y.o.   MRN: [id number]  HPI 05/2017 Patient has several concerns.  He has a history of lumbar degenerative disc disease.  He has episodic low back pain likely exacerbated by his elevated BMI.  He has been unsuccessful in losing weight.  He tends to have pain with prolonged standing for more than 20 minutes.  He has a difficult time walking more than 2 to 3 miles with his job in [employer].  He is unable to lift heavy weight or perform sit ups due to pain in his back.  Over the last few weeks, he has had pain that radiates from his lower back down into his right inguinal canal to his scrotum.  He has not appreciated any bulge in his scrotum or bulge in the inguinal canal.  However the pain is made worse by heavy lifting or Valsalva.  On exam today, I can appreciate no testicular mass.  I can appreciate no direct or indirect inguinal hernia.  He denies any numbness or tingling radiating around from his back to his scrotum.  He denies any hematuria or dysuria.  Blood pressure is elevated today at 148/80.  He denies any chest pain shortness of breath or dyspnea on exertion.  He is 52 years old and therefore he is due for a colonoscopy along with prostate cancer screening.  At that time, my plan was: Recommended starting hydrochlorothiazide 25 mg a day for hypertension and rechecking blood pressure in 1 month.  While the patient is here, I would like to obtain a CBC, CMP, and a fasting lipid panel.  I have recommended 30 pounds weight loss.  We discussed Saxenda as a means to achieve weight loss in addition to diet and exercise and lifestyle changes which so far the patient has been unsuccessful with a line.  Patient will check on insurance coverage prior to me prescribing that.  I will screen the patient for prostate cancer with a PSA.  I will also schedule him for a colonoscopy.  I am uncertain of the cause of his right lower quadrant pain.   Differential diagnosis includes inguinal hernia, kidney stone, neuropathic pain referred from the lumbar spine, muscle strain.  Exam today is normal.  Therefore I will obtain a urinalysis.  If there is hematuria, I will obtain a CT urogram.  If there is no urinalysis, I would recommend a general surgery consultation to evaluate for inguinal hernia.  I am unable to appreciated on exam.  They may require imaging to evaluate further but I will leave that to their discretion.  Pain is gone on for 3 to 4 weeks and history sounds most consistent with inguinal hernia  08/21/17 Since I last saw the patient, he is undergone surgical correction of his recurrent right inguinal hernia.  On the patient's lab work in May, he was found to be a prediabetic with a hemoglobin A1c of 6.3.  At that time he weighed 275 pounds.  We started the patient on Saxenda.  He is also started a diet with his wife including weight watchers.  As result, he has lost 17 pounds in the last 3 months.  He is very proud of that.  Today he weighs 258.  He is following a weight watchers diet plan.  He is also trying to exercise on a daily basis.  He feels so much better now.  His back is feeling better.  His  blood pressure looks great today at 110/74.  He denies any side effects on the medication.  He is due to recheck his hemoglobin A1c. Past Medical History:  Diagnosis Date  . DDD (degenerative disc disease), lumbar   . Diabetes mellitus without complication ([place])   . Dyslipidemia   . GERD (gastroesophageal reflux disease)   . History of hiatal hernia   . History of kidney stones    H/O  . HLD (hyperlipidemia)   . Hypertension   . Obesity   . Prediabetes   . Sciatica of left side   . Tubular adenoma of colon    2019 (Dr. Benson Norway)   Past Surgical History:  Procedure Laterality Date  . COLONOSCOPY    . ESOPHAGOGASTRODUODENOSCOPY    . EXTRACORPOREAL SHOCK WAVE LITHOTRIPSY Left 07/24/2017   Procedure: EXTRACORPOREAL SHOCK WAVE LITHOTRIPSY  (ESWL);  Surgeon: [name], MD;  Location: [facility] ORS;  Service: Urology;  Laterality: Left;  . EYE SURGERY    . INGUINAL HERNIA REPAIR Right 08/08/2017   Procedure: LAPAROSCOPIC INGUINAL HERNIA;  Surgeon: [name], MD;  Location: [facility] ORS;  Service: General;  Laterality: Right;  . TONSILLECTOMY    . VASECTOMY     Current Outpatient Medications on File Prior to Visit  Medication Sig Dispense Refill  . atorvastatin (LIPITOR) 20 MG tablet Take 1 tablet (20 mg total) by mouth daily. (Patient taking differently: Take 20 mg by mouth every morning. ) 90 tablet 3  . celecoxib (CELEBREX) 200 MG capsule TAKE 1 CAPSULE DAILY 90 capsule 3  . cetirizine (ZYRTEC) 10 MG tablet Take 10 mg by mouth daily as needed for allergies.    Marland Kitchen escitalopram (LEXAPRO) 10 MG tablet TAKE 1 TABLET DAILY (Patient taking differently: TAKE 1 TABLET DAILY-AM) 90 tablet 3  . fluticasone (FLONASE) 50 MCG/ACT nasal spray Place 2 sprays into both nostrils daily as needed for allergies or rhinitis.    . hydrochlorothiazide (HYDRODIURIL) 25 MG tablet Take 1 tablet (25 mg total) by mouth daily. 90 tablet 3  . ibuprofen (ADVIL,MOTRIN) 200 MG tablet Take 800 mg by mouth every 6 (six) hours as needed for headache or moderate pain.    . Insulin Pen Needle (BD PEN NEEDLE MICRO U/F) 32G X 6 MM MISC Use as directed to inject Saxenda SQ QD. 100 each 1  . Liraglutide -Weight Management (SAXENDA) 18 MG/3ML SOPN Inject 0.6 mg into the skin daily. Increase by 0.6mg  q week until max dose of 3mg  is reached (Patient taking differently: Inject 3 mg into the skin daily. ) 10 pen 2  . losartan (COZAAR) 100 MG tablet TAKE 1 TABLET DAILY 90 tablet 3  . Melatonin 10 MG TABS Take 10 mg by mouth at bedtime.     . Multiple Vitamins-Minerals (ONE-A-DAY MENS HEALTH FORMULA PO) Take 1 tablet by mouth daily.    . Omega-3 Krill Oil 500 MG CAPS Take 500 mg by mouth daily.     Marland Kitchen omeprazole (PRILOSEC) 40 MG capsule TAKE 1 CAPSULE DAILY (Patient taking  differently: TAKE 1 CAPSULE DAILY-AM) 90 capsule 3   No current facility-administered medications on file prior to visit.    Allergies  Allergen Reactions  . Penicillins Anaphylaxis and Other (See Comments)    Has patient had a PCN reaction causing immediate rash, facial/tongue/throat swelling, SOB or lightheadedness with hypotension: Unknown Has patient had a PCN reaction causing severe rash involving mucus membranes or skin necrosis: No Has patient had a PCN reaction that required hospitalization: Yes Has  patient had a PCN reaction occurring within the last 10 years: No If all of the above answers are "NO", then may proceed with Cephalosporin use.   Marland Kitchen Keflex [Cephalexin] Other (See Comments)    Childhood allergy  . Sulfa Antibiotics Other (See Comments)    Childhood allergy   Social History   Socioeconomic History  . Marital status: Married    Spouse name: Not on file  . Number of children: 1  . Years of education: college  . Highest education level: Not on file  Occupational History  . Not on file  Social Needs  . Financial resource strain: Not on file  . Food insecurity:    Worry: Not on file    Inability: Not on file  . Transportation needs:    Medical: Not on file    Non-medical: Not on file  Tobacco Use  . Smoking status: Never Smoker  . Smokeless tobacco: Never Used  Substance and Sexual Activity  . Alcohol use: Yes    Comment: RARE  . Drug use: No  . Sexual activity: Yes  Lifestyle  . Physical activity:    Days per week: Not on file    Minutes per session: Not on file  . Stress: Not on file  Relationships  . Social connections:    Talks on phone: Not on file    Gets together: Not on file    Attends religious service: Not on file    Active member of club or organization: Not on file    Attends meetings of clubs or organizations: Not on file    Relationship status: Not on file  . Intimate partner violence:    Fear of current or ex partner: Not on file     Emotionally abused: Not on file    Physically abused: Not on file    Forced sexual activity: Not on file  Other Topics Concern  . Not on file  Social History Narrative   Drinks about 1 cup of tea a day      Review of Systems  All other systems reviewed and are negative.      Objective:   Physical Exam  Constitutional: He appears well-developed and well-nourished.  Cardiovascular: Normal rate, regular rhythm and normal heart sounds.  Pulmonary/Chest: Effort normal and breath sounds normal. No stridor. No respiratory distress. He has no wheezes. He has no rales.  Abdominal: Soft. Bowel sounds are normal. He exhibits no distension and no mass. There is no tenderness. There is no rebound and no guarding.  Vitals reviewed.      Assessment:    Prediabetes - Plan: Hemoglobin A1c     Plan:     I am very proud of the patient losing 17 pounds.  I encouraged him to keep it up.  His blood pressure today is excellent 110/74.  I will check hemoglobin A1c today.  I suspect that his hemoglobin A1c will have improved with 17 pounds weight loss.  I explained to the patient that usually people will achieve approximately 10% body weight loss on Saxenda.  Therefore I would expect 20 to 30 pounds total.  I believe he is a little more than half way there.  He will continue the medication at the present time.  Reassess in 6 months.

## 2017-08-21 NOTE — Progress Notes (Signed)
Stone analysis was mixed calcium oxalate.  Proceed with 24 urine study as previously discussed.

## 2017-08-22 LAB — HEMOGLOBIN A1C
Hgb A1c MFr Bld: 6.2 % of total Hgb — ABNORMAL HIGH (ref <5.7)
MEAN PLASMA GLUCOSE: 131 (calc)
eAG (mmol/L): 7.3 (calc)

## 2017-08-22 NOTE — Progress Notes (Signed)
Patient notified

## 2017-09-05 ENCOUNTER — Ambulatory Visit

## 2017-09-05 ENCOUNTER — Telehealth: Payer: Self-pay | Admitting: Urology

## 2017-09-05 NOTE — Telephone Encounter (Signed)
Imaging gave [place] a courtesy call that pt didn't show up for [facility]US today.  Just F.Y.I.

## 2017-09-07 NOTE — Telephone Encounter (Signed)
Recommend rescheduling.  There is still an order in epic

## 2017-09-12 ENCOUNTER — Telehealth: Payer: Self-pay | Admitting: *Deleted

## 2017-09-12 NOTE — Telephone Encounter (Signed)
An active PA is already on file with expiration date of 01/06/2098. Please wait to resubmit request within 60 days of that expiration date to obtain a PA renewal.

## 2017-09-12 NOTE — Telephone Encounter (Signed)
Received request from pharmacy for [place] on Saxenda.  PA submitted.   Dx: E66.09- obesity.     5/13 weight: 275lb 8/15 weight: 258lb % weight loss: 6.18%

## 2017-09-14 ENCOUNTER — Other Ambulatory Visit: Payer: Self-pay | Admitting: Family Medicine

## 2017-09-26 ENCOUNTER — Other Ambulatory Visit: Payer: Self-pay | Admitting: Family Medicine

## 2017-10-24 ENCOUNTER — Encounter: Payer: Self-pay | Admitting: Family Medicine

## 2017-10-27 ENCOUNTER — Telehealth: Payer: Self-pay | Admitting: *Deleted

## 2017-10-27 NOTE — Telephone Encounter (Signed)
Received request from pharmacy for [place] on Saxenda.   PA submitted.   Dx: E66.09- obesity.   5/13 weight: 275lbs 8/15 weight: 258lb   % weight loss: 6.18%

## 2017-10-27 NOTE — Telephone Encounter (Signed)
"  [employer] is processing your inquiry and will respond shortly with next steps. You are currently using the fastest method to process this request. Please do not fax or call [employer] to resubmit this request. To check for an update later, open this request again from your dashboard.    If you need assistance, please chat with CoverMyMeds or call us at [phone]."

## 2017-10-28 NOTE — Telephone Encounter (Signed)
[employer] is reviewing your PA request and will respond within 24 hours for Medicaid or up to 72 hours for non-Medicaid plans, based on the required timeframe determined by state or federal regulations. To check for an update later, open this request from your dashboard.

## 2017-10-28 NOTE — Telephone Encounter (Signed)
Received additional questions in regards to the PA request. Answered and sent to insurance company.

## 2017-10-30 MED ORDER — LIRAGLUTIDE -WEIGHT MANAGEMENT 18 MG/3ML ~~LOC~~ SOPN: 3.0000 mg | PEN_INJECTOR | Freq: Every day | SUBCUTANEOUS | 11 refills | Status: DC

## 2017-10-30 NOTE — Telephone Encounter (Signed)
Received PA determination.   PA [id number] approved 09/28/2017- 10/29/2018.  Pharmacy and patient made aware.

## 2017-12-08 ENCOUNTER — Emergency Department

## 2017-12-08 ENCOUNTER — Emergency Department
Admission: EM | Admit: 2017-12-08 | Discharge: 2017-12-08 | Disposition: A | Discharge status: Home / Self Care | Attending: Emergency Medicine | Admitting: Emergency Medicine

## 2017-12-08 ENCOUNTER — Other Ambulatory Visit: Payer: Self-pay

## 2017-12-08 DIAGNOSIS — N200 Calculus of kidney: Secondary | ICD-10-CM | POA: Diagnosis not present

## 2017-12-08 DIAGNOSIS — I10 Essential (primary) hypertension: Secondary | ICD-10-CM | POA: Diagnosis not present

## 2017-12-08 DIAGNOSIS — Z79899 Other long term (current) drug therapy: Secondary | ICD-10-CM | POA: Diagnosis not present

## 2017-12-08 DIAGNOSIS — R1010 Upper abdominal pain, unspecified: Secondary | ICD-10-CM | POA: Diagnosis present

## 2017-12-08 DIAGNOSIS — Z794 Long term (current) use of insulin: Secondary | ICD-10-CM | POA: Insufficient documentation

## 2017-12-08 LAB — URINALYSIS, COMPLETE (UACMP) WITH MICROSCOPIC
Bacteria, UA: NONE SEEN
Bilirubin Urine: NEGATIVE
Glucose, UA: NEGATIVE mg/dL
Ketones, ur: NEGATIVE mg/dL
LEUKOCYTES UA: NEGATIVE
NITRITE: NEGATIVE
PROTEIN: 30 mg/dL — AB
Specific Gravity, Urine: 1.024 (ref 1.005–1.030)
Squamous Epithelial / LPF: NONE SEEN (ref 0–5)
WBC UA: NONE SEEN WBC/hpf (ref 0–5)
pH: 5 (ref 5.0–8.0)

## 2017-12-08 LAB — BASIC METABOLIC PANEL
ANION GAP: 7 (ref 5–15)
BUN: 19 mg/dL (ref 6–20)
CHLORIDE: 103 mmol/L (ref 98–111)
CO2: 30 mmol/L (ref 22–32)
Calcium: 9.2 mg/dL (ref 8.9–10.3)
Creatinine, Ser: 0.87 mg/dL (ref 0.61–1.24)
Glucose, Bld: 132 mg/dL — ABNORMAL HIGH (ref 70–99)
Potassium: 3.8 mmol/L (ref 3.5–5.1)
SODIUM: 140 mmol/L (ref 135–145)

## 2017-12-08 LAB — CBC
HEMATOCRIT: 41.1 % (ref 39.0–52.0)
HEMOGLOBIN: 13.3 g/dL (ref 13.0–17.0)
MCH: 27.4 pg (ref 26.0–34.0)
MCHC: 32.4 g/dL (ref 30.0–36.0)
MCV: 84.7 fL (ref 80.0–100.0)
NRBC: 0 % (ref 0.0–0.2)
Platelets: 373 10*3/uL (ref 150–400)
RBC: 4.85 MIL/uL (ref 4.22–5.81)
RDW: 14.6 % (ref 11.5–15.5)
WBC: 12.3 10*3/uL — AB (ref 4.0–10.5)

## 2017-12-08 MED ORDER — KETOROLAC TROMETHAMINE 30 MG/ML IJ SOLN
30.0000 mg | Freq: Once | INTRAMUSCULAR | Status: AC
  Administered 2017-12-08: 30 mg via INTRAMUSCULAR
  Filled 2017-12-08: qty 1

## 2017-12-08 NOTE — Discharge Instructions (Addendum)
Please call Dr. Cherrie Gauze office tomorrow morning, avoid any medications that contain NSAIDs

## 2017-12-08 NOTE — ED Triage Notes (Signed)
Pt c/o BL flank pain intermittent for the past 2 weeks worse on the right, states he has a hx of kidney stones. States he did pass some blood in urine a couple of days ago. Denies emesis.

## 2017-12-08 NOTE — ED Provider Notes (Signed)
[facility] Department Provider Note   ____________________________________________    I have reviewed the triage vital signs and the nursing notes.   HISTORY  Chief Complaint Flank Pain     HPI  Zachary Gillespie is a 52 y.o. male who presents with complaints of right flank pain.  Patient reports intermittent pain over the last week.  Does have a history of kidney stones, required lithotripsy on a stone in his left ureter approximately 6 months ago.  Reports the pain is mild to moderate in nature and aching and does radiate into his groin.  Denies fevers or chills.  No dysuria.  Did notice some hematuria several days ago but none today.  Has been taking over-the-counter medications with some improvement   Past Medical History:  Diagnosis Date  . DDD (degenerative disc disease), lumbar   . Dyslipidemia   . GERD (gastroesophageal reflux disease)   . History of hiatal hernia   . History of kidney stones    H/O  . HLD (hyperlipidemia)   . Hypertension   . Obesity   . Prediabetes   . Sciatica of left side   . Tubular adenoma of colon    2019 (Dr. Benson [place])    Patient Active Problem List   Diagnosis Date Noted  . Non-recurrent unilateral inguinal hernia without obstruction or gangrene   . Calculus of ureter 07/20/2017  . Nephrolithiasis 07/20/2017  . Prediabetes   . HLD (hyperlipidemia)   . Obesity   . Hypertension   . DDD (degenerative disc disease), lumbar   . GERD (gastroesophageal reflux disease)   . Sciatica of left side   . Dyslipidemia     Past Surgical History:  Procedure Laterality Date  . COLONOSCOPY    . ESOPHAGOGASTRODUODENOSCOPY    . EXTRACORPOREAL SHOCK WAVE LITHOTRIPSY Left 07/24/2017   Procedure: EXTRACORPOREAL SHOCK WAVE LITHOTRIPSY (ESWL);  Surgeon: [name], MD;  Location: [facility] ORS;  Service: Urology;  Laterality: Left;  . EYE SURGERY    . INGUINAL HERNIA REPAIR Right 08/08/2017   Procedure: LAPAROSCOPIC  INGUINAL HERNIA;  Surgeon: [name], MD;  Location: [facility] ORS;  Service: General;  Laterality: Right;  . TONSILLECTOMY    . VASECTOMY      Prior to Admission medications   Medication Sig Start Date End Date Taking? Authorizing Provider  atorvastatin (LIPITOR) 20 MG tablet Take 1 tablet (20 mg total) by mouth daily. Patient taking differently: Take 20 mg by mouth every morning.  05/22/17   [name], MD  celecoxib (CELEBREX) 200 MG capsule TAKE 1 CAPSULE DAILY 09/26/17   [name], MD  cetirizine (ZYRTEC) 10 MG tablet Take 10 mg by mouth daily as needed for allergies.    [provider]  escitalopram (LEXAPRO) 10 MG tablet TAKE 1 TABLET DAILY Patient taking differently: TAKE 1 TABLET DAILY-AM 05/23/17   [name], MD  fluticasone (FLONASE) 50 MCG/ACT nasal spray Place 2 sprays into both nostrils daily as needed for allergies or rhinitis.    [provider]  hydrochlorothiazide (HYDRODIURIL) 25 MG tablet Take 1 tablet (25 mg total) by mouth daily. 05/19/17   [name], MD  ibuprofen (ADVIL,MOTRIN) 200 MG tablet Take 800 mg by mouth every 6 (six) hours as needed for headache or moderate pain.    [provider]  Insulin Pen Needle (BD PEN NEEDLE MICRO U/F) 32G X 6 MM MISC Use as directed to inject Saxenda SQ QD. 05/30/17  [name], MD  Liraglutide -Weight Management (SAXENDA) 18 MG/3ML SOPN Inject 3 mg into the skin daily. Increase by 0.6mg  q week until max dose of 3mg  is reached 10/30/17   [name], MD  losartan (COZAAR) 100 MG tablet TAKE 1 TABLET DAILY 08/18/17   [name], MD  Melatonin 10 MG TABS Take 10 mg by mouth at bedtime.     [provider]  Multiple Vitamins-Minerals (ONE-A-DAY MENS HEALTH FORMULA PO) Take 1 tablet by mouth daily.    [provider]  Omega-3 Krill Oil 500 MG CAPS Take 500 mg by mouth daily.     [provider]  omeprazole (PRILOSEC) 40 MG capsule TAKE 1  CAPSULE DAILY 09/15/17   [name], MD     Allergies Penicillins; Keflex [cephalexin]; and Sulfa antibiotics  Family History  Problem Relation Age of Onset  . Heart disease Father   . Diabetes Father   . Hyperlipidemia Father   . Diabetes Sister   . Heart disease Paternal Grandfather   . Bladder Cancer Neg Hx   . Kidney cancer Neg Hx   . Prostate cancer Neg Hx     Social History Social History   Tobacco Use  . Smoking status: Never Smoker  . Smokeless tobacco: Never Used  Substance Use Topics  . Alcohol use: Yes    Comment: RARE  . Drug use: No    Review of Systems  Constitutional: No fever/chills Eyes: No visual changes.  ENT: No sore throat. Cardiovascular: Denies chest pain. Respiratory: Denies shortness of breath. Gastrointestinal: As above Genitourinary: As above Musculoskeletal: Negative for back pain. Skin: Negative for rash. Neurological: Negative for headaches or weakness   ____________________________________________   PHYSICAL EXAM:  VITAL SIGNS: ED Triage Vitals [12/08/17 1754]  Enc Vitals Group     BP 135/80     Pulse Rate 92     Resp 17     Temp 99.2 F (37.3 C)     Temp Source Oral     SpO2 99 %     Weight 113.4 kg (250 lb)     Height 1.753 m (5\' 9" )     Head Circumference      Peak Flow      Pain Score 3     Pain Loc      Pain Edu?      Excl. in [place]?     Constitutional: Alert and oriented. No acute distress.   Nose: No congestion/rhinnorhea. Mouth/Throat: Mucous membranes are moist.   Neck:  Painless ROM Cardiovascular: Normal rate, regular rhythm.  Good peripheral circulation. Respiratory: Normal respiratory effort.  No retractions. Gastrointestinal: Soft and nontender. No distention.  No CVA tenderness.  Musculoskeletal: Warm and well perfused Neurologic:  Normal speech and language. No gross focal neurologic deficits are appreciated.  Skin:  Skin is warm, dry and intact. No rash noted. Psychiatric: Mood and  affect are normal. Speech and behavior are normal.  ____________________________________________   LABS (all labs ordered are listed, but only abnormal results are displayed)  Labs Reviewed  URINALYSIS, COMPLETE (UACMP) WITH MICROSCOPIC - Abnormal; Notable for the following components:      Result Value   Color, Urine YELLOW (*)    APPearance CLOUDY (*)    Hgb urine dipstick LARGE (*)    Protein, ur 30 (*)    RBC / HPF >50 (*)    All other components within normal limits  BASIC METABOLIC PANEL - Abnormal; Notable for  the following components:   Glucose, Bld 132 (*)    All other components within normal limits  CBC - Abnormal; Notable for the following components:   WBC 12.3 (*)    All other components within normal limits   ____________________________________________  EKG  None ____________________________________________  RADIOLOGY  KUB demonstrates 7 mm stone right hemiabdomen likely proximal ureter ____________________________________________   PROCEDURES  Procedure(s) performed: No  Procedures   Critical Care performed: No ____________________________________________   INITIAL IMPRESSION / ASSESSMENT AND PLAN / ED COURSE  Pertinent labs & imaging results that were available during my care of the patient were reviewed by me and considered in my medical decision making (see chart for details).  Discussed KUB results with Dr. Erlene Quan, she recommends no NSAIDs starting tomorrow, follow-up in the office for likely lithotripsy on Thursday    ____________________________________________   FINAL CLINICAL IMPRESSION(S) / ED DIAGNOSES  Final diagnoses:  Kidney stone        Note:  This document was prepared using Dragon voice recognition software and may include unintentional dictation errors.    Lavonia Drafts, MD 12/08/17 2135

## 2017-12-09 ENCOUNTER — Encounter: Payer: Self-pay | Admitting: Urology

## 2017-12-09 ENCOUNTER — Telehealth: Payer: Self-pay | Admitting: Urology

## 2017-12-09 ENCOUNTER — Ambulatory Visit (INDEPENDENT_AMBULATORY_CARE_PROVIDER_SITE_OTHER): Admitting: Urology

## 2017-12-09 VITALS — BP 126/82 | HR 85 | Ht 69.0 in | Wt 275.6 lb

## 2017-12-09 DIAGNOSIS — R109 Unspecified abdominal pain: Secondary | ICD-10-CM

## 2017-12-09 DIAGNOSIS — N201 Calculus of ureter: Secondary | ICD-10-CM | POA: Diagnosis not present

## 2017-12-09 MED ORDER — HYDROCODONE-ACETAMINOPHEN 5-325 MG PO TABS: 1.0000 | ORAL_TABLET | Freq: Four times a day (QID) | ORAL | 0 refills | Status: DC | PRN

## 2017-12-09 NOTE — H&P (View-Only) (Signed)
12/09/2017 5:09 PM   Zachary Gillespie [birth year] [id number]  Referring provider: [name], MD [address] [facility] [place], [place] [address]  Chief Complaint  Patient presents with  . Nephrolithiasis    HPI: 52 year old male with a personal history of nephrolithiasis who returns to the office today after being seen in the emergency room yesterday with an obstructing right proximal ureteral calculus.  He reports that about a week ago, he began experiencing severe right flank pain with associated nausea and vomiting.  He did have an episode of gross hematuria at the time.  He had severe pain for several days which subsequently subsided.  His pain recurred and he ultimately ended up in the ER last night.  KUB in the emergency room yesterday showed an approximately 6 mm right ureteral calculus, presumably obstructive based on the size and location.  He continues to have intermittent right flank pain.  There is no other signs or symptoms of infection, UA unremarkable other than for blood.   Creatinine 0.87.  WBC 12.3.  No fevers or chills.  No dysuria or further gross hematuria.  He does have a personal history of kidney stones, required ESWL for left distal ureteral calculus 07/2017 which was highly effective, passed stones in the PACU.   PMH: Past Medical History:  Diagnosis Date  . DDD (degenerative disc disease), lumbar   . Dyslipidemia   . GERD (gastroesophageal reflux disease)   . History of hiatal hernia   . History of kidney stones    H/O  . HLD (hyperlipidemia)   . Hypertension   . Obesity   . Prediabetes   . Sciatica of left side   . Tubular adenoma of colon    2019 (Dr. Benson Norway)    Surgical History: Past Surgical History:  Procedure Laterality Date  . COLONOSCOPY    . ESOPHAGOGASTRODUODENOSCOPY    . EXTRACORPOREAL SHOCK WAVE LITHOTRIPSY Left 07/24/2017   Procedure: EXTRACORPOREAL SHOCK WAVE LITHOTRIPSY (ESWL);  Surgeon: [name], MD;  Location:  [facility] ORS;  Service: Urology;  Laterality: Left;  . EYE SURGERY    . INGUINAL HERNIA REPAIR Right 08/08/2017   Procedure: LAPAROSCOPIC INGUINAL HERNIA;  Surgeon: [name], MD;  Location: [facility] ORS;  Service: General;  Laterality: Right;  . TONSILLECTOMY    . VASECTOMY      Home Medications:  Allergies as of 12/09/2017      Reactions   Penicillins Anaphylaxis, Other (See Comments)   Has patient had a PCN reaction causing immediate rash, facial/tongue/throat swelling, SOB or lightheadedness with hypotension: Unknown Has patient had a PCN reaction causing severe rash involving mucus membranes or skin necrosis: No Has patient had a PCN reaction that required hospitalization: Yes Has patient had a PCN reaction occurring within the last 10 years: No If all of the above answers are "NO", then may proceed with Cephalosporin use.   Keflex [cephalexin] Other (See Comments)   Childhood allergy   Sulfa Antibiotics Other (See Comments)   Childhood allergy      Medication List        Accurate as of 12/09/17  5:09 PM. Always use your most recent med list.          atorvastatin 20 MG tablet Commonly known as:  LIPITOR Take 1 tablet (20 mg total) by mouth daily.   celecoxib 200 MG capsule Commonly known as:  CELEBREX TAKE 1 CAPSULE DAILY   cetirizine 10 MG tablet Commonly known as:  ZYRTEC Take 10  mg by mouth daily as needed for allergies.   escitalopram 10 MG tablet Commonly known as:  LEXAPRO TAKE 1 TABLET DAILY   fluticasone 50 MCG/ACT nasal spray Commonly known as:  FLONASE Place 2 sprays into both nostrils daily as needed for allergies or rhinitis.   hydrochlorothiazide 25 MG tablet Commonly known as:  HYDRODIURIL Take 1 tablet (25 mg total) by mouth daily.   HYDROcodone-acetaminophen 5-325 MG tablet Commonly known as:  NORCO/VICODIN Take 1-2 tablets by mouth every 6 (six) hours as needed for moderate pain.   ibuprofen 200 MG tablet Commonly known as:   ADVIL,MOTRIN Take 800 mg by mouth every 6 (six) hours as needed for headache or moderate pain.   Insulin Pen Needle 32G X 6 MM Misc Use as directed to inject Saxenda SQ QD.   Liraglutide -Weight Management 18 MG/3ML Sopn Inject 3 mg into the skin daily. Increase by 0.6mg  q week until max dose of 3mg  is reached   losartan 100 MG tablet Commonly known as:  COZAAR TAKE 1 TABLET DAILY   Melatonin 10 MG Tabs Take 10 mg by mouth at bedtime.   Omega-3 Krill Oil 500 MG Caps Take 500 mg by mouth daily.   omeprazole 40 MG capsule Commonly known as:  PRILOSEC TAKE 1 CAPSULE DAILY   ONE-A-DAY MENS HEALTH FORMULA PO Take 1 tablet by mouth daily.       Allergies:  Allergies  Allergen Reactions  . Penicillins Anaphylaxis and Other (See Comments)    Has patient had a PCN reaction causing immediate rash, facial/tongue/throat swelling, SOB or lightheadedness with hypotension: Unknown Has patient had a PCN reaction causing severe rash involving mucus membranes or skin necrosis: No Has patient had a PCN reaction that required hospitalization: Yes Has patient had a PCN reaction occurring within the last 10 years: No If all of the above answers are "NO", then may proceed with Cephalosporin use.   Marland Kitchen Keflex [Cephalexin] Other (See Comments)    Childhood allergy  . Sulfa Antibiotics Other (See Comments)    Childhood allergy    Family History: Family History  Problem Relation Age of Onset  . Heart disease Father   . Diabetes Father   . Hyperlipidemia Father   . Diabetes Sister   . Heart disease Paternal Grandfather   . Bladder Cancer Neg Hx   . Kidney cancer Neg Hx   . Prostate cancer Neg Hx     Social History:  reports that he has never smoked. He has never used smokeless tobacco. He reports that he drinks alcohol. He reports that he does not use drugs.  ROS: UROLOGY Frequent Urination?: No Hard to postpone urination?: No Burning/pain with urination?: No Get up at night to  urinate?: No Leakage of urine?: No Urine stream starts and stops?: No Trouble starting stream?: No Do you have to strain to urinate?: No Blood in urine?: Yes Urinary tract infection?: No Sexually transmitted disease?: No Injury to kidneys or bladder?: No Painful intercourse?: No Weak stream?: No Erection problems?: No Penile pain?: No  Gastrointestinal Nausea?: No Vomiting?: No Indigestion/heartburn?: No Diarrhea?: No Constipation?: No  Constitutional Fever: No Night sweats?: No Weight loss?: No Fatigue?: No  Skin Skin rash/lesions?: No Itching?: No  Eyes Blurred vision?: No Double vision?: No  Ears/Nose/Throat Sore throat?: No Sinus problems?: No  Hematologic/Lymphatic Swollen glands?: No Easy bruising?: No  Cardiovascular Leg swelling?: No Chest pain?: No  Respiratory Cough?: No Shortness of breath?: No  Endocrine Excessive thirst?: No  Musculoskeletal Back pain?: Yes Joint pain?: No  Neurological Headaches?: No Dizziness?: No  Psychologic Depression?: No Anxiety?: No  Physical Exam: BP 126/82 (BP Location: Left Arm, Patient Position: Sitting, Cuff Size: Large)   Pulse 85   Ht 5\' 9"  (1.753 m)   Wt 275 lb 9.6 oz (125 kg)   BMI 40.70 kg/m   Constitutional:  Alert and oriented, No acute distress. HEENT: [place] AT, moist mucus membranes.  Trachea midline, no masses. Cardiovascular: No clubbing, cyanosis, or edema. Respiratory: Normal respiratory effort, no increased work of breathing. GI: Abdomen is soft, nontender, nondistended, no abdominal masses GU: No CVA tenderness Lymph: No cervical or inguinal lymphadenopathy. Skin: No rashes, bruises or suspicious lesions. Neurologic: Grossly intact, no focal deficits, moving all 4 extremities. Psychiatric: Normal mood and affect.  Laboratory Data: Lab Results  Component Value Date   WBC 12.3 (H) 12/08/2017   HGB 13.3 12/08/2017   HCT 41.1 12/08/2017   MCV 84.7 12/08/2017   PLT 373  12/08/2017    Lab Results  Component Value Date   CREATININE 0.87 12/08/2017    Lab Results  Component Value Date   PSA 0.8 05/19/2017   PSA 1.43 10/06/2014   PSA 1.09 10/04/2013    Lab Results  Component Value Date   TESTOSTERONE 195 (L) 10/06/2014    Lab Results  Component Value Date   HGBA1C 6.2 (H) 08/21/2017    Urinalysis    Component Value Date/Time   COLORURINE YELLOW (A) 12/08/2017 1757   APPEARANCEUR CLOUDY (A) 12/08/2017 1757   APPEARANCEUR Clear 07/18/2017 1102   LABSPEC 1.024 12/08/2017 1757   PHURINE 5.0 12/08/2017 1757   GLUCOSEU NEGATIVE 12/08/2017 1757   HGBUR LARGE (A) 12/08/2017 1757   BILIRUBINUR NEGATIVE 12/08/2017 1757   BILIRUBINUR Negative 07/18/2017 1102   KETONESUR NEGATIVE 12/08/2017 1757   PROTEINUR 30 (A) 12/08/2017 1757   UROBILINOGEN 0.2 08/14/2012 1250   NITRITE NEGATIVE 12/08/2017 1757   LEUKOCYTESUR NEGATIVE 12/08/2017 1757   LEUKOCYTESUR Negative 07/18/2017 1102    Lab Results  Component Value Date   LABMICR See below: 07/18/2017   WBCUA 0-5 07/18/2017   RBCUA 0-2 07/18/2017   LABEPIT None seen 07/18/2017   MUCUS Present (A) 07/18/2017   BACTERIA NONE SEEN 12/08/2017    Pertinent Imaging: Results for orders placed during the hospital encounter of 12/08/17  DG Abdomen 1 View   Narrative CLINICAL DATA:  Patient with bilateral flank pain, intermittent for the past 2 weeks. History of renal stones.  EXAM: ABDOMEN - 1 VIEW  COMPARISON:  Abdominal radiograph 08/05/2017  FINDINGS: 7 mm stone within the right hemiabdomen appears to be within the expected location of the proximal right ureter. Phlebolith within the left hemipelvis. No additional ossifications identified within the abdomen. Postsurgical changes within the pelvis. Paucity of bowel gas.  IMPRESSION: 7 mm stone within the right hemiabdomen favored to be located within the proximal right ureter given interval change in location.   Electronically  Signed   By: Lovey Newcomer M.D.   On: 12/08/2017 19:41     Assessment & Plan:    1. Right ureteral stone 7 mm right proximal ureteral calculus  Based on the size and location of the stone, he has approximately 50% chance of passing it on exam.  Given has had pain for over a week, he is very anxious to have something done in the near future.  He has done well with ESWL in the past and is interested in this procedure.  We did discuss alternatives including ureteroscopy as well as efficacy rate the procedures.  We discussed risks and benefits of each.  Specifically for shockwave lithotripsy, we discussed the risk of hematoma, infection, Steinstrasse, need for further procedures amongst others.  All questions were answered today.  He will need to continue to hold his Celebrex which he last took at 8 AM on Monday.  Preop urine culture today.  He was given a strainer as well as a few tablets of pain medication as needed.  He was encouraged to take Flomax.  Warning symptoms reviewed along with indications for more urgent/emergent evaluation  2. Right flank pain As above  [name], MD  [place] [address], [place], [place] [address] [phone]  I spent 25  min with this patient of which greater than 50% was spent in counseling and coordination of care with the patient.

## 2017-12-09 NOTE — Telephone Encounter (Signed)
Can you have this patient come to my clinic this afternoon (3pm)??  He he was in the ED overnight with a stone which needs to treated in the near future.  Thanks.  [name], MD

## 2017-12-09 NOTE — Progress Notes (Signed)
12/09/2017 5:09 PM   [name] 1965-01-12 [id number]  Referring provider: [name], MD [address] [facility] [place], [place] [address]  Chief Complaint  Patient presents with  . Nephrolithiasis    HPI: 52 year old male with a personal history of nephrolithiasis who returns to the office today after being seen in the emergency room yesterday with an obstructing right proximal ureteral calculus.  He reports that about a week ago, he began experiencing severe right flank pain with associated nausea and vomiting.  He did have an episode of gross hematuria at the time.  He had severe pain for several days which subsequently subsided.  His pain recurred and he ultimately ended up in the ER last night.  KUB in the emergency room yesterday showed an approximately 6 mm right ureteral calculus, presumably obstructive based on the size and location.  He continues to have intermittent right flank pain.  There is no other signs or symptoms of infection, UA unremarkable other than for blood.   Creatinine 0.87.  WBC 12.3.  No fevers or chills.  No dysuria or further gross hematuria.  He does have a personal history of kidney stones, required ESWL for left distal ureteral calculus 07/2017 which was highly effective, passed stones in the PACU.   PMH: Past Medical History:  Diagnosis Date  . DDD (degenerative disc disease), lumbar   . Dyslipidemia   . GERD (gastroesophageal reflux disease)   . History of hiatal hernia   . History of kidney stones    H/O  . HLD (hyperlipidemia)   . Hypertension   . Obesity   . Prediabetes   . Sciatica of left side   . Tubular adenoma of colon    2019 (Dr. Benson Norway)    Surgical History: Past Surgical History:  Procedure Laterality Date  . COLONOSCOPY    . ESOPHAGOGASTRODUODENOSCOPY    . EXTRACORPOREAL SHOCK WAVE LITHOTRIPSY Left 07/24/2017   Procedure: EXTRACORPOREAL SHOCK WAVE LITHOTRIPSY (ESWL);  Surgeon: [name], MD;  Location:  [facility] ORS;  Service: Urology;  Laterality: Left;  . EYE SURGERY    . INGUINAL HERNIA REPAIR Right 08/08/2017   Procedure: LAPAROSCOPIC INGUINAL HERNIA;  Surgeon: [name], MD;  Location: [facility] ORS;  Service: General;  Laterality: Right;  . TONSILLECTOMY    . VASECTOMY      Home Medications:  Allergies as of 12/09/2017      Reactions   Penicillins Anaphylaxis, Other (See Comments)   Has patient had a PCN reaction causing immediate rash, facial/tongue/throat swelling, SOB or lightheadedness with hypotension: Unknown Has patient had a PCN reaction causing severe rash involving mucus membranes or skin necrosis: No Has patient had a PCN reaction that required hospitalization: Yes Has patient had a PCN reaction occurring within the last 10 years: No If all of the above answers are "NO", then may proceed with Cephalosporin use.   Keflex [cephalexin] Other (See Comments)   Childhood allergy   Sulfa Antibiotics Other (See Comments)   Childhood allergy      Medication List        Accurate as of 12/09/17  5:09 PM. Always use your most recent med list.          atorvastatin 20 MG tablet Commonly known as:  LIPITOR Take 1 tablet (20 mg total) by mouth daily.   celecoxib 200 MG capsule Commonly known as:  CELEBREX TAKE 1 CAPSULE DAILY   cetirizine 10 MG tablet Commonly known as:  ZYRTEC Take 10  mg by mouth daily as needed for allergies.   escitalopram 10 MG tablet Commonly known as:  LEXAPRO TAKE 1 TABLET DAILY   fluticasone 50 MCG/ACT nasal spray Commonly known as:  FLONASE Place 2 sprays into both nostrils daily as needed for allergies or rhinitis.   hydrochlorothiazide 25 MG tablet Commonly known as:  HYDRODIURIL Take 1 tablet (25 mg total) by mouth daily.   HYDROcodone-acetaminophen 5-325 MG tablet Commonly known as:  NORCO/VICODIN Take 1-2 tablets by mouth every 6 (six) hours as needed for moderate pain.   ibuprofen 200 MG tablet Commonly known as:   ADVIL,MOTRIN Take 800 mg by mouth every 6 (six) hours as needed for headache or moderate pain.   Insulin Pen Needle 32G X 6 MM Misc Use as directed to inject Saxenda SQ QD.   Liraglutide -Weight Management 18 MG/3ML Sopn Inject 3 mg into the skin daily. Increase by 0.6mg  q week until max dose of 3mg  is reached   losartan 100 MG tablet Commonly known as:  COZAAR TAKE 1 TABLET DAILY   Melatonin 10 MG Tabs Take 10 mg by mouth at bedtime.   Omega-3 Krill Oil 500 MG Caps Take 500 mg by mouth daily.   omeprazole 40 MG capsule Commonly known as:  PRILOSEC TAKE 1 CAPSULE DAILY   ONE-A-DAY MENS HEALTH FORMULA PO Take 1 tablet by mouth daily.       Allergies:  Allergies  Allergen Reactions  . Penicillins Anaphylaxis and Other (See Comments)    Has patient had a PCN reaction causing immediate rash, facial/tongue/throat swelling, SOB or lightheadedness with hypotension: Unknown Has patient had a PCN reaction causing severe rash involving mucus membranes or skin necrosis: No Has patient had a PCN reaction that required hospitalization: Yes Has patient had a PCN reaction occurring within the last 10 years: No If all of the above answers are "NO", then may proceed with Cephalosporin use.   Marland Kitchen Keflex [Cephalexin] Other (See Comments)    Childhood allergy  . Sulfa Antibiotics Other (See Comments)    Childhood allergy    Family History: Family History  Problem Relation Age of Onset  . Heart disease Father   . Diabetes Father   . Hyperlipidemia Father   . Diabetes Sister   . Heart disease Paternal Grandfather   . Bladder Cancer Neg Hx   . Kidney cancer Neg Hx   . Prostate cancer Neg Hx     Social History:  reports that he has never smoked. He has never used smokeless tobacco. He reports that he drinks alcohol. He reports that he does not use drugs.  ROS: UROLOGY Frequent Urination?: No Hard to postpone urination?: No Burning/pain with urination?: No Get up at night to  urinate?: No Leakage of urine?: No Urine stream starts and stops?: No Trouble starting stream?: No Do you have to strain to urinate?: No Blood in urine?: Yes Urinary tract infection?: No Sexually transmitted disease?: No Injury to kidneys or bladder?: No Painful intercourse?: No Weak stream?: No Erection problems?: No Penile pain?: No  Gastrointestinal Nausea?: No Vomiting?: No Indigestion/heartburn?: No Diarrhea?: No Constipation?: No  Constitutional Fever: No Night sweats?: No Weight loss?: No Fatigue?: No  Skin Skin rash/lesions?: No Itching?: No  Eyes Blurred vision?: No Double vision?: No  Ears/Nose/Throat Sore throat?: No Sinus problems?: No  Hematologic/Lymphatic Swollen glands?: No Easy bruising?: No  Cardiovascular Leg swelling?: No Chest pain?: No  Respiratory Cough?: No Shortness of breath?: No  Endocrine Excessive thirst?: No  Musculoskeletal Back pain?: Yes Joint pain?: No  Neurological Headaches?: No Dizziness?: No  Psychologic Depression?: No Anxiety?: No  Physical Exam: BP 126/82 (BP Location: Left Arm, Patient Position: Sitting, Cuff Size: Large)   Pulse 85   Ht 5\' 9"  (1.753 m)   Wt 275 lb 9.6 oz (125 kg)   BMI 40.70 kg/m   Constitutional:  Alert and oriented, No acute distress. HEENT: [place] AT, moist mucus membranes.  Trachea midline, no masses. Cardiovascular: No clubbing, cyanosis, or edema. Respiratory: Normal respiratory effort, no increased work of breathing. GI: Abdomen is soft, nontender, nondistended, no abdominal masses GU: No CVA tenderness Lymph: No cervical or inguinal lymphadenopathy. Skin: No rashes, bruises or suspicious lesions. Neurologic: Grossly intact, no focal deficits, moving all 4 extremities. Psychiatric: Normal mood and affect.  Laboratory Data: Lab Results  Component Value Date   WBC 12.3 (H) 12/08/2017   HGB 13.3 12/08/2017   HCT 41.1 12/08/2017   MCV 84.7 12/08/2017   PLT 373  12/08/2017    Lab Results  Component Value Date   CREATININE 0.87 12/08/2017    Lab Results  Component Value Date   PSA 0.8 05/19/2017   PSA 1.43 10/06/2014   PSA 1.09 10/04/2013    Lab Results  Component Value Date   TESTOSTERONE 195 (L) 10/06/2014    Lab Results  Component Value Date   HGBA1C 6.2 (H) 08/21/2017    Urinalysis    Component Value Date/Time   COLORURINE YELLOW (A) 12/08/2017 1757   APPEARANCEUR CLOUDY (A) 12/08/2017 1757   APPEARANCEUR Clear 07/18/2017 1102   LABSPEC 1.024 12/08/2017 1757   PHURINE 5.0 12/08/2017 1757   GLUCOSEU NEGATIVE 12/08/2017 1757   HGBUR LARGE (A) 12/08/2017 1757   BILIRUBINUR NEGATIVE 12/08/2017 1757   BILIRUBINUR Negative 07/18/2017 1102   KETONESUR NEGATIVE 12/08/2017 1757   PROTEINUR 30 (A) 12/08/2017 1757   UROBILINOGEN 0.2 08/14/2012 1250   NITRITE NEGATIVE 12/08/2017 1757   LEUKOCYTESUR NEGATIVE 12/08/2017 1757   LEUKOCYTESUR Negative 07/18/2017 1102    Lab Results  Component Value Date   LABMICR See below: 07/18/2017   WBCUA 0-5 07/18/2017   RBCUA 0-2 07/18/2017   LABEPIT None seen 07/18/2017   MUCUS Present (A) 07/18/2017   BACTERIA NONE SEEN 12/08/2017    Pertinent Imaging: Results for orders placed during the hospital encounter of 12/08/17  DG Abdomen 1 View   Narrative CLINICAL DATA:  Patient with bilateral flank pain, intermittent for the past 2 weeks. History of renal stones.  EXAM: ABDOMEN - 1 VIEW  COMPARISON:  Abdominal radiograph 08/05/2017  FINDINGS: 7 mm stone within the right hemiabdomen appears to be within the expected location of the proximal right ureter. Phlebolith within the left hemipelvis. No additional ossifications identified within the abdomen. Postsurgical changes within the pelvis. Paucity of bowel gas.  IMPRESSION: 7 mm stone within the right hemiabdomen favored to be located within the proximal right ureter given interval change in location.   Electronically  Signed   By: Lovey Newcomer M.D.   On: 12/08/2017 19:41     Assessment & Plan:    1. Right ureteral stone 7 mm right proximal ureteral calculus  Based on the size and location of the stone, he has approximately 50% chance of passing it on exam.  Given has had pain for over a week, he is very anxious to have something done in the near future.  He has done well with ESWL in the past and is interested in this procedure.  We did discuss alternatives including ureteroscopy as well as efficacy rate the procedures.  We discussed risks and benefits of each.  Specifically for shockwave lithotripsy, we discussed the risk of hematoma, infection, Steinstrasse, need for further procedures amongst others.  All questions were answered today.  He will need to continue to hold his Celebrex which he last took at 8 AM on Monday.  Preop urine culture today.  He was given a strainer as well as a few tablets of pain medication as needed.  He was encouraged to take Flomax.  Warning symptoms reviewed along with indications for more urgent/emergent evaluation  2. Right flank pain As above  [name], MD  [place] [address], [place], [place] [address] [phone]  I spent 25  min with this patient of which greater than 50% was spent in counseling and coordination of care with the patient.

## 2017-12-10 ENCOUNTER — Other Ambulatory Visit: Payer: Self-pay | Admitting: Radiology

## 2017-12-11 ENCOUNTER — Encounter: Payer: Self-pay | Admitting: *Deleted

## 2017-12-11 ENCOUNTER — Ambulatory Visit
Admission: RE | Admit: 2017-12-11 | Discharge: 2017-12-11 | Disposition: A | Discharge status: Home / Self Care | Source: Ambulatory Visit | Attending: Urology | Admitting: Urology

## 2017-12-11 ENCOUNTER — Encounter
Admission: RE | Disposition: A | Discharge status: Home / Self Care | Payer: Self-pay | Source: Ambulatory Visit | Attending: Urology

## 2017-12-11 ENCOUNTER — Emergency Department

## 2017-12-11 ENCOUNTER — Other Ambulatory Visit: Payer: Self-pay

## 2017-12-11 ENCOUNTER — Ambulatory Visit

## 2017-12-11 ENCOUNTER — Emergency Department
Admission: EM | Admit: 2017-12-11 | Discharge: 2017-12-12 | Disposition: A | Discharge status: Home / Self Care | Source: Home / Self Care | Attending: Emergency Medicine | Admitting: Emergency Medicine

## 2017-12-11 DIAGNOSIS — Z87442 Personal history of urinary calculi: Secondary | ICD-10-CM | POA: Diagnosis not present

## 2017-12-11 DIAGNOSIS — I10 Essential (primary) hypertension: Secondary | ICD-10-CM | POA: Insufficient documentation

## 2017-12-11 DIAGNOSIS — R7303 Prediabetes: Secondary | ICD-10-CM | POA: Insufficient documentation

## 2017-12-11 DIAGNOSIS — R109 Unspecified abdominal pain: Secondary | ICD-10-CM

## 2017-12-11 DIAGNOSIS — N200 Calculus of kidney: Secondary | ICD-10-CM

## 2017-12-11 DIAGNOSIS — Z79899 Other long term (current) drug therapy: Secondary | ICD-10-CM | POA: Insufficient documentation

## 2017-12-11 DIAGNOSIS — Z882 Allergy status to sulfonamides status: Secondary | ICD-10-CM | POA: Insufficient documentation

## 2017-12-11 DIAGNOSIS — E785 Hyperlipidemia, unspecified: Secondary | ICD-10-CM | POA: Insufficient documentation

## 2017-12-11 DIAGNOSIS — Z794 Long term (current) use of insulin: Secondary | ICD-10-CM | POA: Diagnosis not present

## 2017-12-11 DIAGNOSIS — K219 Gastro-esophageal reflux disease without esophagitis: Secondary | ICD-10-CM | POA: Insufficient documentation

## 2017-12-11 DIAGNOSIS — M5136 Other intervertebral disc degeneration, lumbar region: Secondary | ICD-10-CM | POA: Insufficient documentation

## 2017-12-11 DIAGNOSIS — Z880 Allergy status to penicillin: Secondary | ICD-10-CM | POA: Diagnosis not present

## 2017-12-11 DIAGNOSIS — N202 Calculus of kidney with calculus of ureter: Secondary | ICD-10-CM | POA: Diagnosis present

## 2017-12-11 DIAGNOSIS — Z881 Allergy status to other antibiotic agents status: Secondary | ICD-10-CM | POA: Diagnosis not present

## 2017-12-11 DIAGNOSIS — Z791 Long term (current) use of non-steroidal anti-inflammatories (NSAID): Secondary | ICD-10-CM | POA: Diagnosis not present

## 2017-12-11 HISTORY — PX: EXTRACORPOREAL SHOCK WAVE LITHOTRIPSY: SHX1557

## 2017-12-11 LAB — URINALYSIS, COMPLETE (UACMP) WITH MICROSCOPIC
Bacteria, UA: NONE SEEN
Bilirubin Urine: NEGATIVE
Glucose, UA: NEGATIVE mg/dL
Ketones, ur: 5 mg/dL — AB
Leukocytes, UA: NEGATIVE
Nitrite: NEGATIVE
Protein, ur: 100 mg/dL — AB
SPECIFIC GRAVITY, URINE: 1.026 (ref 1.005–1.030)
Squamous Epithelial / HPF: NONE SEEN (ref 0–5)
pH: 6 (ref 5.0–8.0)

## 2017-12-11 LAB — COMPREHENSIVE METABOLIC PANEL
ALK PHOS: 75 U/L (ref 38–126)
ALT: 30 U/L (ref 0–44)
AST: 27 U/L (ref 15–41)
Albumin: 4.3 g/dL (ref 3.5–5.0)
Anion gap: 9 (ref 5–15)
BUN: 24 mg/dL — ABNORMAL HIGH (ref 6–20)
CALCIUM: 9.4 mg/dL (ref 8.9–10.3)
CO2: 26 mmol/L (ref 22–32)
Chloride: 99 mmol/L (ref 98–111)
Creatinine, Ser: 1.39 mg/dL — ABNORMAL HIGH (ref 0.61–1.24)
GFR calc Af Amer: >60 mL/min (ref >60)
GFR calc non Af Amer: 58 mL/min — ABNORMAL LOW (ref >60)
GLUCOSE: 168 mg/dL — AB (ref 70–99)
Potassium: 3.7 mmol/L (ref 3.5–5.1)
Sodium: 134 mmol/L — ABNORMAL LOW (ref 135–145)
Total Bilirubin: 0.6 mg/dL (ref 0.3–1.2)
Total Protein: 7.1 g/dL (ref 6.5–8.1)

## 2017-12-11 LAB — GLUCOSE, CAPILLARY: Glucose-Capillary: 114 mg/dL — ABNORMAL HIGH (ref 70–99)

## 2017-12-11 LAB — CBC
HCT: 41.8 % (ref 39.0–52.0)
Hemoglobin: 13.2 g/dL (ref 13.0–17.0)
MCH: 27.1 pg (ref 26.0–34.0)
MCHC: 31.6 g/dL (ref 30.0–36.0)
MCV: 85.8 fL (ref 80.0–100.0)
PLATELETS: 242 10*3/uL (ref 150–400)
RBC: 4.87 MIL/uL (ref 4.22–5.81)
RDW: 14.4 % (ref 11.5–15.5)
WBC: 13.7 10*3/uL — ABNORMAL HIGH (ref 4.0–10.5)
nRBC: 0 % (ref 0.0–0.2)

## 2017-12-11 SURGERY — LITHOTRIPSY, ESWL
Anesthesia: Moderate Sedation | Laterality: Right

## 2017-12-11 MED ORDER — DIAZEPAM 5 MG PO TABS
10.0000 mg | ORAL_TABLET | Freq: Once | ORAL | Status: AC
  Administered 2017-12-11: 10 mg via ORAL

## 2017-12-11 MED ORDER — HYDROCODONE-ACETAMINOPHEN 5-325 MG PO TABS
1.0000 | ORAL_TABLET | Freq: Once | ORAL | Status: AC
  Administered 2017-12-11: 1 via ORAL

## 2017-12-11 MED ORDER — HYDROMORPHONE HCL 1 MG/ML IJ SOLN
1.0000 mg | Freq: Once | INTRAMUSCULAR | Status: AC
  Administered 2017-12-11: 1 mg via INTRAVENOUS
  Filled 2017-12-11: qty 1

## 2017-12-11 MED ORDER — DIPHENHYDRAMINE HCL 25 MG PO CAPS
ORAL_CAPSULE | ORAL | Status: AC
  Administered 2017-12-11: 25 mg via ORAL
  Filled 2017-12-11: qty 1

## 2017-12-11 MED ORDER — ONDANSETRON HCL 4 MG/2ML IJ SOLN
4.0000 mg | Freq: Once | INTRAMUSCULAR | Status: AC
  Administered 2017-12-11: 4 mg via INTRAVENOUS

## 2017-12-11 MED ORDER — SODIUM CHLORIDE 0.9 % IV BOLUS
1000.0000 mL | Freq: Once | INTRAVENOUS | Status: AC
  Administered 2017-12-11: 1000 mL via INTRAVENOUS

## 2017-12-11 MED ORDER — CIPROFLOXACIN HCL 500 MG PO TABS
500.0000 mg | ORAL_TABLET | Freq: Once | ORAL | Status: AC
  Administered 2017-12-11: 500 mg via ORAL

## 2017-12-11 MED ORDER — TAMSULOSIN HCL 0.4 MG PO CAPS: 0.4000 mg | ORAL_CAPSULE | Freq: Every day | ORAL | 0 refills | Status: DC

## 2017-12-11 MED ORDER — DIPHENHYDRAMINE HCL 25 MG PO CAPS
25.0000 mg | ORAL_CAPSULE | Freq: Once | ORAL | Status: AC
  Administered 2017-12-11: 25 mg via ORAL

## 2017-12-11 MED ORDER — HYDROCODONE-ACETAMINOPHEN 5-325 MG PO TABS
ORAL_TABLET | ORAL | Status: AC
  Administered 2017-12-11: 1 via ORAL
  Filled 2017-12-11: qty 1

## 2017-12-11 MED ORDER — ONDANSETRON HCL 4 MG/2ML IJ SOLN
INTRAMUSCULAR | Status: AC
  Administered 2017-12-11: 4 mg via INTRAVENOUS
  Filled 2017-12-11: qty 2

## 2017-12-11 MED ORDER — SODIUM CHLORIDE 0.9 % IV SOLN
Freq: Once | INTRAVENOUS | Status: AC
  Administered 2017-12-11: 10:00:00 via INTRAVENOUS

## 2017-12-11 MED ORDER — HYDROCODONE-ACETAMINOPHEN 5-325 MG PO TABS: 1.0000 | ORAL_TABLET | Freq: Four times a day (QID) | ORAL | 0 refills | Status: DC | PRN

## 2017-12-11 MED ORDER — DIAZEPAM 5 MG PO TABS
ORAL_TABLET | ORAL | Status: AC
  Administered 2017-12-11: 10 mg via ORAL
  Filled 2017-12-11: qty 2

## 2017-12-11 MED ORDER — CIPROFLOXACIN HCL 500 MG PO TABS
ORAL_TABLET | ORAL | Status: AC
  Administered 2017-12-11: 500 mg via ORAL
  Filled 2017-12-11: qty 1

## 2017-12-11 MED ORDER — FENTANYL CITRATE (PF) 100 MCG/2ML IJ SOLN
100.0000 ug | Freq: Once | INTRAMUSCULAR | Status: AC
  Administered 2017-12-11: 100 ug via INTRAVENOUS
  Filled 2017-12-11: qty 2

## 2017-12-11 NOTE — ED Provider Notes (Addendum)
[facility] Emergency Department Provider Note  ____________________________________________   I have reviewed the triage vital signs and the nursing notes. Where available I have reviewed prior notes and, if possible and indicated, outside hospital notes.    HISTORY  Chief Complaint Flank Pain    HPI Zachary Gillespie is a 52 y.o. male who presents today complaining of feeling significant pain in his right flank after having had lithotripsy this morning for a 7 mm mid ureteral stone.  He states that he has not subjectively had a fever but he tried an ear thermometer at home that said he had 1 however when he arrived here a few minutes later he did not.  He denies any vomiting.  He states the pain is significant and been ongoing since he went home from his lithotripsy.  He has been trying the pain medications that were prescribed to him with minimal relief.  Pain is severe.  Harp, mostly in the right flank.  Radiating aggravating factors no other complaints   Past Medical History:  Diagnosis Date  . DDD (degenerative disc disease), lumbar   . Dyslipidemia   . GERD (gastroesophageal reflux disease)   . History of hiatal hernia   . History of kidney stones    H/O  . HLD (hyperlipidemia)   . Hypertension   . Obesity   . Prediabetes   . Sciatica of left side   . Tubular adenoma of colon    2019 (Dr. Benson Norway)    Patient Active Problem List   Diagnosis Date Noted  . Non-recurrent unilateral inguinal hernia without obstruction or gangrene   . Calculus of ureter 07/20/2017  . Nephrolithiasis 07/20/2017  . Prediabetes   . HLD (hyperlipidemia)   . Obesity   . Hypertension   . DDD (degenerative disc disease), lumbar   . GERD (gastroesophageal reflux disease)   . Sciatica of left side   . Dyslipidemia     Past Surgical History:  Procedure Laterality Date  . COLONOSCOPY    . ESOPHAGOGASTRODUODENOSCOPY    . EXTRACORPOREAL SHOCK WAVE LITHOTRIPSY Left  07/24/2017   Procedure: EXTRACORPOREAL SHOCK WAVE LITHOTRIPSY (ESWL);  Surgeon: [name], MD;  Location: [facility] ORS;  Service: Urology;  Laterality: Left;  . EXTRACORPOREAL SHOCK WAVE LITHOTRIPSY Right 12/11/2017   Procedure: EXTRACORPOREAL SHOCK WAVE LITHOTRIPSY (ESWL);  Surgeon: [name], MD;  Location: [facility] ORS;  Service: Urology;  Laterality: Right;  . EYE SURGERY    . INGUINAL HERNIA REPAIR Right 08/08/2017   Procedure: LAPAROSCOPIC INGUINAL HERNIA;  Surgeon: [name], MD;  Location: [facility] ORS;  Service: General;  Laterality: Right;  . TONSILLECTOMY    . VASECTOMY      Prior to Admission medications   Medication Sig Start Date End Date Taking? Authorizing Provider  atorvastatin (LIPITOR) 20 MG tablet Take 1 tablet (20 mg total) by mouth daily. Patient taking differently: Take 20 mg by mouth every morning.  05/22/17   [name], MD  celecoxib (CELEBREX) 200 MG capsule TAKE 1 CAPSULE DAILY 09/26/17   [name], MD  cetirizine (ZYRTEC) 10 MG tablet Take 10 mg by mouth daily as needed for allergies.    [provider]  escitalopram (LEXAPRO) 10 MG tablet TAKE 1 TABLET DAILY Patient taking differently: TAKE 1 TABLET DAILY-AM 05/23/17   [name], MD  fluticasone (FLONASE) 50 MCG/ACT nasal spray Place 2 sprays into both nostrils daily as needed for allergies or rhinitis.    [provider]  hydrochlorothiazide (HYDRODIURIL) 25 MG tablet Take 1 tablet (25 mg total) by mouth daily. 05/19/17   [name], MD  HYDROcodone-acetaminophen (NORCO/VICODIN) 5-325 MG tablet Take 1-2 tablets by mouth every 6 (six) hours as needed for moderate pain. 12/11/17   [name], MD  ibuprofen (ADVIL,MOTRIN) 200 MG tablet Take 800 mg by mouth every 6 (six) hours as needed for headache or moderate pain.    [provider]  Insulin Pen Needle (BD PEN NEEDLE MICRO U/F) 32G X 6 MM MISC Use as directed to inject Saxenda SQ QD. 05/30/17   [name], MD  Liraglutide -Weight Management (SAXENDA) 18 MG/3ML SOPN Inject 3 mg into the skin daily. Increase by 0.6mg  q week until max dose of 3mg  is reached 10/30/17   [name], MD  losartan (COZAAR) 100 MG tablet TAKE 1 TABLET DAILY 08/18/17   [name], MD  Melatonin 10 MG TABS Take 10 mg by mouth at bedtime.     [provider]  Multiple Vitamins-Minerals (ONE-A-DAY MENS HEALTH FORMULA PO) Take 1 tablet by mouth daily.    [provider]  Omega-3 Krill Oil 500 MG CAPS Take 500 mg by mouth daily.     [provider]  omeprazole (PRILOSEC) 40 MG capsule TAKE 1 CAPSULE DAILY 09/15/17   [name], MD  tamsulosin (FLOMAX) 0.4 MG CAPS capsule Take 1 capsule (0.4 mg total) by mouth daily. 12/11/17   [name], MD    Allergies Penicillins; Keflex [cephalexin]; and Sulfa antibiotics  Family History  Problem Relation Age of Onset  . Heart disease Father   . Diabetes Father   . Hyperlipidemia Father   . Diabetes Sister   . Heart disease Paternal Grandfather   . Bladder Cancer Neg Hx   . Kidney cancer Neg Hx   . Prostate cancer Neg Hx     Social History Social History   Tobacco Use  . Smoking status: Never Smoker  . Smokeless tobacco: Never Used  Substance Use Topics  . Alcohol use: Yes    Comment: RARE  . Drug use: No    Review of Systems Constitutional: See HPI Eyes: No visual changes. ENT: No sore throat. No stiff neck no neck pain Cardiovascular: Denies chest pain. Respiratory: Denies shortness of breath. Gastrointestinal:   no vomiting.  No diarrhea.  No constipation. Genitourinary: Negative for dysuria. Musculoskeletal: Negative lower extremity swelling Skin: Negative for rash. Neurological: Negative for severe headaches, focal weakness or numbness.   ____________________________________________   PHYSICAL EXAM:  VITAL SIGNS: ED Triage Vitals  Enc Vitals Group     BP 12/11/17 1939 132/87     Pulse Rate  12/11/17 1939 99     Resp 12/11/17 1939 (!) 24     Temp 12/11/17 1939 98.4 F (36.9 C)     Temp Source 12/11/17 1939 Oral     SpO2 12/11/17 1939 100 %     Weight 12/11/17 1942 275 lb (124.7 kg)     Height 12/11/17 1942 5\' 9"  (1.753 m)     Head Circumference --      Peak Flow --      Pain Score 12/11/17 1942 10     Pain Loc --      Pain Edu? --      Excl. in [place]? --     Constitutional: Alert and oriented.  Looks very uncomfortable but otherwise nontoxic Eyes: Conjunctivae are normal Head: Atraumatic HEENT: No congestion/rhinnorhea. Mucous membranes are  moist.  Oropharynx non-erythematous Neck:   Nontender with no meningismus, no masses, no stridor Cardiovascular: Normal rate, regular rhythm. Grossly normal heart sounds.  Good peripheral circulation. Respiratory: Normal respiratory effort.  No retractions. Lungs CTAB. Abdominal: Soft and nontender. No distention. No guarding no rebound Back:  There is no focal tenderness or step off.  there is no midline tenderness there are no lesions noted. there is + CVA tenderness Musculoskeletal: No lower extremity tenderness, no upper extremity tenderness. No joint effusions, no DVT signs strong distal pulses no edema Neurologic:  Normal speech and language. No gross focal neurologic deficits are appreciated.  Skin:  Skin is warm, dry and intact. No rash noted. Psychiatric: Mood and affect are normal. Speech and behavior are normal.  ____________________________________________   LABS (all labs ordered are listed, but only abnormal results are displayed)  Labs Reviewed  CBC - Abnormal; Notable for the following components:      Result Value   WBC 13.7 (*)    All other components within normal limits  URINALYSIS, COMPLETE (UACMP) WITH MICROSCOPIC - Abnormal; Notable for the following components:   Color, Urine YELLOW (*)    APPearance HAZY (*)    Hgb urine dipstick LARGE (*)    Ketones, ur 5 (*)    Protein, ur 100 (*)    RBC / HPF >50  (*)    All other components within normal limits  COMPREHENSIVE METABOLIC PANEL - Abnormal; Notable for the following components:   Sodium 134 (*)    Glucose, Bld 168 (*)    BUN 24 (*)    Creatinine, Ser 1.39 (*)    GFR calc non Af Amer 58 (*)    All other components within normal limits    Pertinent labs  results that were available during my care of the patient were reviewed by me and considered in my medical decision making (see chart for details). ____________________________________________  EKG  I personally interpreted any EKGs ordered by me or triage  ____________________________________________  RADIOLOGY  Pertinent labs & imaging results that were available during my care of the patient were reviewed by me and considered in my medical decision making (see chart for details). If possible, patient and/or family made aware of any abnormal findings.  Dg Abdomen 1 View  Result Date: 12/11/2017 CLINICAL DATA:  52 year old male with left flank pain. Lithotripsy. EXAM: ABDOMEN - 1 VIEW COMPARISON:  Abdominal radiograph dated 12/11/2017 FINDINGS: The previously seen calcific focus along the expected course of the mid right ureter is not visualized with certainty on this radiograph. No definite radiopaque focus identified over the expected region of the renal silhouette or along the expected course of the ureters. A 7 mm calcific focus in the right upper quadrant appears to be lateral to the expected region of the kidney and may represent a costochondral calcification. Hernia repair mesh noted over the pelvis. No acute osseous pathology. IMPRESSION: Previously seen calcific focus along the expected course of the mid right ureter is not visualized with certainty on this radiograph. Electronically Signed   By: Anner Crete M.D.   On: 12/11/2017 21:48   Dg Abd 1 View - Kub  Result Date: 12/11/2017 CLINICAL DATA:  Renal calculi. EXAM: ABDOMEN - 1 VIEW COMPARISON:  12/08/2017. FINDINGS:  Soft tissue structures are unremarkable. No bowel distention or free air. Calcific density noted over the mid right ureter in unchanged position. This is consistent with a right ureteral stone. Stable pelvic calcifications consistent phleboliths. IMPRESSION: Stable  calcific density projected the right mid ureter consistent with mid right ureteral stone. Electronically Signed   By: Marcello Moores  Register   On: 12/11/2017 12:32   ____________________________________________    PROCEDURES  Procedure(s) performed: None  Procedures  Critical Care performed: None  ____________________________________________   INITIAL IMPRESSION / ASSESSMENT AND PLAN / ED COURSE  Pertinent labs & imaging results that were available during my care of the patient were reviewed by me and considered in my medical decision making (see chart for details).  ----------------------------------------- 11:05 PM on 12/11/2017 -----------------------------------------  Patient here with significant pain after lithotripsy I did discuss with the urologist on-call for Dr. Erlene Quan, Dr. Diamantina Providence very much appreciate consult, he did recommend narcotic pain medications he would would like to avoid Toradol, he would prefer not to have a CT scan and this we cannot get the patient's pain under control, and if we can he is comfortable with having the patient go home with Flomax and ongoing pain medication at home.  We did discuss the patient's reported fever and all the signs and symptoms pertaining to the chart thus far including his imaging and his blood work and his vitals.  After 100 of fentanyl, patient was still very uncomfortable I am not trying Dilaudid.  If that does not work we may try lidocaine and if we have to try 3 different doses of pain medications, we may need to do a CT scan to ensure that there is no renal hematoma or other complication from the procedure. Signed out at the end of my shift to dr.  Kerman Passey  ----------------------------------------- 11:10 PM on 12/11/2017 -----------------------------------------  Patient's pain is down to 1 or 2 and he is much more comfortable, we are giving him IV fluid because his creatinine is somewhat up.  He is awake despite the pain medications which is good, we will observe her briefly in the department, if he is still under control we will try to get him home.    ____________________________________________   FINAL CLINICAL IMPRESSION(S) / ED DIAGNOSES  Final diagnoses:  None      This chart was dictated using voice recognition software.  Despite best efforts to proofread,  errors can occur which can change meaning.      Schuyler Amor, MD 12/11/17 [id number]    Schuyler Amor, MD 12/11/17 [phone]

## 2017-12-11 NOTE — ED Notes (Signed)
Pt unable to void at this time. 

## 2017-12-11 NOTE — Discharge Instructions (Signed)
AMBULATORY SURGERY  DISCHARGE INSTRUCTIONS   1) The drugs that you were given will stay in your system until tomorrow so for the next 24 hours you should not:  A) Drive an automobile B) Make any legal decisions C) Drink any alcoholic beverage   2) You may resume regular meals tomorrow.  Today it is better to start with liquids and gradually work up to solid foods.  You may eat anything you prefer, but it is better to start with liquids, then soup and crackers, and gradually work up to solid foods.   3) Please notify your doctor immediately if you have any unusual bleeding, trouble breathing, redness and pain at the surgery site, drainage, fever, or pain not relieved by medication.    4) Additional Instructions:   Please contact your physician with any problems or Same Day Surgery at [phone], Monday through Friday 6 am to 4 pm, or [place] [facility] [place] Main number at [phone].See [facility] discharge instructions in chart.  

## 2017-12-11 NOTE — ED Triage Notes (Signed)
Pt to triage via wheelchair.  Pt had lithotripsy today.  Pt has left flank pain with nausea.  Pt taking vicodin without relief.

## 2017-12-11 NOTE — Interval H&P Note (Signed)
History and Physical Interval Note:  12/11/2017 11:17 AM  Zachary Gillespie  has presented today for surgery, with the diagnosis of Kidney stone  The various methods of treatment have been discussed with the patient and family. After consideration of risks, benefits and other options for treatment, the patient has consented to  Procedure(s): EXTRACORPOREAL SHOCK WAVE LITHOTRIPSY (ESWL) (Right) as a surgical intervention .  The patient's history has been reviewed, patient examined, no change in status, stable for surgery.  I have reviewed the patient's chart and labs.  Questions were answered to the patient's satisfaction.     [name]

## 2017-12-11 NOTE — Discharge Instructions (Signed)
Return to the emergency room for any new or worrisome symptoms including uncontrolled pain, vomiting, fever, lightheadedness or if you feel worse in any way.  Take the pain medications as prescribed by the urologist as well as the Flomax.

## 2017-12-12 MED ORDER — OXYCODONE-ACETAMINOPHEN 5-325 MG PO TABS: 1.0000 | ORAL_TABLET | ORAL | 0 refills | Status: DC | PRN

## 2017-12-12 NOTE — ED Provider Notes (Signed)
-----------------------------------------   12:56 AM on 12/12/2017 -----------------------------------------  Patient care assumed from Dr. Burlene Arnt.  Patient states his pain is much more tolerable now.  Would rated as a 3/10 in severity.  I offered to continue to watch the patient, he states he feels like he wants to try to go home.  I told the patient have a low threshold to return if his pain returns or he develops a fever.  Patient agreeable to plan of care.  We will discharge with oxycodone as the patient has only a hydrocodone prescription.   Harvest Dark, MD 12/12/17 [phone]

## 2017-12-25 ENCOUNTER — Other Ambulatory Visit: Payer: Self-pay | Admitting: Family Medicine

## 2017-12-25 ENCOUNTER — Ambulatory Visit: Admitting: Urology

## 2017-12-26 ENCOUNTER — Encounter: Payer: Self-pay | Admitting: Urology

## 2017-12-26 ENCOUNTER — Ambulatory Visit (INDEPENDENT_AMBULATORY_CARE_PROVIDER_SITE_OTHER): Admitting: Urology

## 2017-12-26 ENCOUNTER — Ambulatory Visit
Admission: RE | Admit: 2017-12-26 | Discharge: 2017-12-26 | Disposition: A | Discharge status: Home / Self Care | Source: Ambulatory Visit | Attending: Urology | Admitting: Urology

## 2017-12-26 ENCOUNTER — Ambulatory Visit
Admission: RE | Admit: 2017-12-26 | Discharge: 2017-12-26 | Disposition: A | Discharge status: Home / Self Care | Attending: Urology | Admitting: Urology

## 2017-12-26 VITALS — BP 119/80 | HR 77 | Ht 69.0 in

## 2017-12-26 DIAGNOSIS — N200 Calculus of kidney: Secondary | ICD-10-CM

## 2017-12-26 NOTE — Progress Notes (Signed)
 12/26/2017 1:18 PM   Zachary Gillespie 02/11/1965 [id number]  Referring provider: Pickard, Warren T, MD [address] [place], [place] [address]  Chief Complaint  Patient presents with  . Follow-up  . Nephrolithiasis    HPI: 52-year-old male presents for postop follow-up.  He underwent shockwave lithotripsy of a 6 mm right proximal ureteral calculus by Dr. Brandon on 12/11/2017.  He presented to the ED the evening of the procedure with severe right renal colic however imaging showed no obvious stone fragments.  He passed several sand-like particles the following day and has been asymptomatic since that time.  He also underwent shockwave lithotripsy July 2019.  A metabolic evaluation was recommended however he states he has the kit but has not collected.  Prior stone analysis was mixed calcium oxalate.   PMH: Past Medical History:  Diagnosis Date  . DDD (degenerative disc disease), lumbar   . Dyslipidemia   . GERD (gastroesophageal reflux disease)   . History of hiatal hernia   . History of kidney stones    H/O  . HLD (hyperlipidemia)   . Hypertension   . Obesity   . Prediabetes   . Sciatica of left side   . Tubular adenoma of colon    2019 (Dr. Hung)    Surgical History: Past Surgical History:  Procedure Laterality Date  . COLONOSCOPY    . ESOPHAGOGASTRODUODENOSCOPY    . EXTRACORPOREAL SHOCK WAVE LITHOTRIPSY Left 07/24/2017   Procedure: EXTRACORPOREAL SHOCK WAVE LITHOTRIPSY (ESWL);  Surgeon: Brandon, Ashley, MD;  Location: [facility] ORS;  Service: Urology;  Laterality: Left;  . EXTRACORPOREAL SHOCK WAVE LITHOTRIPSY Right 12/11/2017   Procedure: EXTRACORPOREAL SHOCK WAVE LITHOTRIPSY (ESWL);  Surgeon: Brandon, Ashley, MD;  Location: [facility] ORS;  Service: Urology;  Laterality: Right;  . EYE SURGERY    . INGUINAL HERNIA REPAIR Right 08/08/2017   Procedure: LAPAROSCOPIC INGUINAL HERNIA;  Surgeon: Cooper, Rasheem E, MD;  Location: [facility] ORS;  Service: General;  Laterality:  Right;  . TONSILLECTOMY    . VASECTOMY      Home Medications:  Allergies as of 12/26/2017      Reactions   Penicillins Anaphylaxis, Other (See Comments)   Has patient had a PCN reaction causing immediate rash, facial/tongue/throat swelling, SOB or lightheadedness with hypotension: Unknown Has patient had a PCN reaction causing severe rash involving mucus membranes or skin necrosis: No Has patient had a PCN reaction that required hospitalization: Yes Has patient had a PCN reaction occurring within the last 10 years: No If all of the above answers are "NO", then may proceed with Cephalosporin use.   Sulfa Antibiotics Other (See Comments), Swelling   Childhood allergy   Keflex [cephalexin] Other (See Comments)   Childhood allergy      Medication List       Accurate as of December 26, 2017  1:18 PM. Always use your most recent med list.        atorvastatin 20 MG tablet Commonly known as:  LIPITOR Take 1 tablet (20 mg total) by mouth daily.   celecoxib 200 MG capsule Commonly known as:  CELEBREX TAKE 1 CAPSULE DAILY   cetirizine 10 MG tablet Commonly known as:  ZYRTEC TAKE 1 TABLET DAILY   escitalopram 10 MG tablet Commonly known as:  LEXAPRO TAKE 1 TABLET DAILY   fluticasone 50 MCG/ACT nasal spray Commonly known as:  FLONASE Place 2 sprays into both nostrils daily as needed for allergies or rhinitis.   hydrochlorothiazide 25 MG tablet Commonly known   as:  HYDRODIURIL Take 1 tablet (25 mg total) by mouth daily.   Insulin Pen Needle 32G X 6 MM Misc Commonly known as:  BD PEN NEEDLE MICRO U/F Use as directed to inject Saxenda SQ QD.   Liraglutide -Weight Management 18 MG/3ML Sopn Commonly known as:  SAXENDA Inject 3 mg into the skin daily. Increase by 0.6mg q week until max dose of 3mg is reached   losartan 100 MG tablet Commonly known as:  COZAAR TAKE 1 TABLET DAILY   Melatonin 10 MG Tabs Take 10 mg by mouth at bedtime.   Omega-3 Krill Oil 500 MG Caps Take  500 mg by mouth daily.   omeprazole 40 MG capsule Commonly known as:  PRILOSEC TAKE 1 CAPSULE DAILY   ONE-A-DAY MENS HEALTH FORMULA PO Take 1 tablet by mouth daily.   tamsulosin 0.4 MG Caps capsule Commonly known as:  FLOMAX Take 1 capsule (0.4 mg total) by mouth daily.       Allergies:  Allergies  Allergen Reactions  . Penicillins Anaphylaxis and Other (See Comments)    Has patient had a PCN reaction causing immediate rash, facial/tongue/throat swelling, SOB or lightheadedness with hypotension: Unknown Has patient had a PCN reaction causing severe rash involving mucus membranes or skin necrosis: No Has patient had a PCN reaction that required hospitalization: Yes Has patient had a PCN reaction occurring within the last 10 years: No If all of the above answers are "NO", then may proceed with Cephalosporin use.   . Sulfa Antibiotics Other (See Comments) and Swelling    Childhood allergy  . Keflex [Cephalexin] Other (See Comments)    Childhood allergy    Family History: Family History  Problem Relation Age of Onset  . Heart disease Father   . Diabetes Father   . Hyperlipidemia Father   . Diabetes Sister   . Heart disease Paternal Grandfather   . Bladder Cancer Neg Hx   . Kidney cancer Neg Hx   . Prostate cancer Neg Hx     Social History:  reports that he has never smoked. He has never used smokeless tobacco. He reports current alcohol use. He reports that he does not use drugs.   Physical Exam: BP 119/80 (BP Location: Left Arm, Patient Position: Sitting)   Pulse 77   Ht 5' 9" (1.753 m)   BMI 40.61 kg/m   Constitutional:  Alert and oriented, No acute distress.    Assessment & Plan:   Doing well status post shockwave lithotripsy.  I recommended a follow-up renal ultrasound within the next few weeks and he will be notified with results.  We also discussed pursuing the metabolic evaluation.  Follow-up 6 months with a KUB.   Scott C Stoioff, MD  [facility] [address], [address] , [place] [address] [phone]  

## 2018-01-04 ENCOUNTER — Other Ambulatory Visit: Payer: Self-pay | Admitting: Family Medicine

## 2018-01-06 ENCOUNTER — Ambulatory Visit
Admission: RE | Admit: 2018-01-06 | Discharge: 2018-01-06 | Disposition: A | Discharge status: Home / Self Care | Source: Ambulatory Visit | Attending: Urology | Admitting: Urology

## 2018-01-06 DIAGNOSIS — N200 Calculus of kidney: Secondary | ICD-10-CM | POA: Diagnosis present

## 2018-01-08 ENCOUNTER — Telehealth: Payer: Self-pay | Admitting: Family Medicine

## 2018-01-08 NOTE — Telephone Encounter (Signed)
Patient notified and voiced understanding.

## 2018-01-08 NOTE — Telephone Encounter (Signed)
-----   Message from [name], MD sent at 01/08/2018  8:49 AM EST ----- Renal ultrasound showed no right sided calculi or evidence of blockage.  Left lower pole calculi are noted.  Follow-up as scheduled.

## 2018-01-08 NOTE — Telephone Encounter (Signed)
Pt returned missed call. Please return call. Thanks.

## 2018-01-08 NOTE — Telephone Encounter (Signed)
LMOM for patient for patient to return call.

## 2018-02-12 ENCOUNTER — Telehealth: Payer: Self-pay | Admitting: *Deleted

## 2018-02-12 MED ORDER — LIRAGLUTIDE -WEIGHT MANAGEMENT 18 MG/3ML ~~LOC~~ SOPN: 3.0000 mg | PEN_INJECTOR | Freq: Every day | SUBCUTANEOUS | 11 refills | Status: DC

## 2018-02-12 NOTE — Telephone Encounter (Signed)
Received PA determination.   PA [id number] approved 01/13/2018- 02/12/2019.

## 2018-02-12 NOTE — Telephone Encounter (Signed)
Received request from pharmacy for [place] on Saxenda.  PA submitted.   Dx: E66.09- Obesity.   05/19/17 weight:      275lbs 08/21/17 weight:      258lb    % weight loss:       6.18%

## 2018-02-12 NOTE — Telephone Encounter (Signed)
[employer] is reviewing your PA request and will respond within 24 hours for Medicaid or up to 72 hours for non-Medicaid plans, based on the required timeframe determined by state or federal regulations. To check for an update later, open this request from your dashboard.

## 2018-02-27 ENCOUNTER — Other Ambulatory Visit: Payer: Self-pay | Admitting: Family Medicine

## 2018-03-29 ENCOUNTER — Encounter: Payer: Self-pay | Admitting: Family Medicine

## 2018-03-30 ENCOUNTER — Ambulatory Visit (INDEPENDENT_AMBULATORY_CARE_PROVIDER_SITE_OTHER): Admitting: Family Medicine

## 2018-03-30 ENCOUNTER — Other Ambulatory Visit: Payer: Self-pay

## 2018-03-30 DIAGNOSIS — J019 Acute sinusitis, unspecified: Secondary | ICD-10-CM

## 2018-03-30 MED ORDER — LEVOFLOXACIN 500 MG PO TABS: 500.0000 mg | ORAL_TABLET | Freq: Every day | ORAL | 0 refills | Status: DC

## 2018-03-30 NOTE — Progress Notes (Signed)
Subjective:    Patient ID: Zachary Gillespie, male    DOB: [birth year], 53 y.o.   MRN: [id number]  HPI Patient is being seen today through a telephone visit.  He is currently at home.  I am in my office.  Visit began at 1013 in the morning.  Visit concluded at 1024.  Symptoms include 7 days of head congestion.  He denies any travel.  He has been working at home for the last 2 weeks.  He states that he wakes up every morning with his eyes matted shut due to drainage.  He also has conjunctivitis.  He reports green nasal drainage.  He reports pain and pressure in both maxillary sinuses.  He has a cough productive of green thick sputum that he believes is postnasal drip.  He has a sore throat due to the postnasal drip.  He is having sinus headaches.  He has had a fever from 99-100.4 over the last 2 days.  He has been treating this with Sudafed.  He is also been using Zyrtec.  He has been using Flonase.  He has been using Mucinex.  He is even drinking moonshine to help with the cough.  He denies any shortness of breath.  He denies any chest pain.  He denies any wheezing or pleurisy.  Symptoms are confined to his head and postnasal drip and sinus pressure Past Medical History:  Diagnosis Date  . DDD (degenerative disc disease), lumbar   . Dyslipidemia   . GERD (gastroesophageal reflux disease)   . History of hiatal hernia   . History of kidney stones    H/O  . HLD (hyperlipidemia)   . Hypertension   . Obesity   . Prediabetes   . Sciatica of left side   . Tubular adenoma of colon    2019 (Dr. Benson [place])   Past Surgical History:  Procedure Laterality Date  . COLONOSCOPY    . ESOPHAGOGASTRODUODENOSCOPY    . EXTRACORPOREAL SHOCK WAVE LITHOTRIPSY Left 07/24/2017   Procedure: EXTRACORPOREAL SHOCK WAVE LITHOTRIPSY (ESWL);  Surgeon: [name], MD;  Location: [facility];  Service: Urology;  Laterality: Left;  . EXTRACORPOREAL SHOCK WAVE LITHOTRIPSY Right 12/11/2017   Procedure: EXTRACORPOREAL SHOCK WAVE  LITHOTRIPSY (ESWL);  Surgeon: [name], MD;  Location: [facility];  Service: Urology;  Laterality: Right;  . EYE SURGERY    . INGUINAL HERNIA REPAIR Right 08/08/2017   Procedure: LAPAROSCOPIC INGUINAL HERNIA;  Surgeon: [name], MD;  Location: [facility];  Service: General;  Laterality: Right;  . TONSILLECTOMY    . VASECTOMY     Current Outpatient Medications on File Prior to Visit  Medication Sig Dispense Refill  . atorvastatin (LIPITOR) 20 MG tablet Take 1 tablet (20 mg total) by mouth daily. (Patient taking differently: Take 20 mg by mouth every morning. ) 90 tablet 3  . BD PEN NEEDLE MICRO U/F 32G X 6 MM MISC USE AS DIRECTED TO  INJECT  SAXENDA  SUBCUTANEOUSLY  ONCE  DAILY 100 each 5  . celecoxib (CELEBREX) 200 MG capsule TAKE 1 CAPSULE DAILY 90 capsule 4  . cetirizine (ZYRTEC) 10 MG tablet TAKE 1 TABLET DAILY 90 tablet 4  . escitalopram (LEXAPRO) 10 MG tablet TAKE 1 TABLET DAILY (Patient taking differently: TAKE 1 TABLET DAILY-AM) 90 tablet 3  . fluticasone (FLONASE) 50 MCG/ACT nasal spray USE 2 SPRAYS IN EACH NOSTRIL DAILY 48 g 4  . hydrochlorothiazide (HYDRODIURIL) 25 MG tablet Take 1 tablet (25 mg total) by mouth daily. [place]  tablet 3  . Liraglutide -Weight Management (SAXENDA) 18 MG/3ML SOPN Inject 3 mg into the skin daily. Increase by 0.6mg  q week until max dose of 3mg  is reached 5 pen 11  . losartan (COZAAR) 100 MG tablet TAKE 1 TABLET DAILY 90 tablet 3  . Melatonin 10 MG TABS Take 10 mg by mouth at bedtime.     . Multiple Vitamins-Minerals (ONE-A-DAY MENS HEALTH FORMULA PO) Take 1 tablet by mouth daily.    . Omega-3 Krill Oil 500 MG CAPS Take 500 mg by mouth daily.     Marland Kitchen omeprazole (PRILOSEC) 40 MG capsule TAKE 1 CAPSULE DAILY 90 capsule 4  . tamsulosin (FLOMAX) 0.4 MG CAPS capsule Take 1 capsule (0.4 mg total) by mouth daily. (Patient not taking: Reported on 12/26/2017) 14 capsule 0   No current facility-administered medications on file prior to visit.    Allergies   Allergen Reactions  . Penicillins Anaphylaxis and Other (See Comments)    Has patient had a PCN reaction causing immediate rash, facial/tongue/throat swelling, SOB or lightheadedness with hypotension: Unknown Has patient had a PCN reaction causing severe rash involving mucus membranes or skin necrosis: No Has patient had a PCN reaction that required hospitalization: Yes Has patient had a PCN reaction occurring within the last 10 years: No If all of the above answers are "NO", then may proceed with Cephalosporin use.   . Sulfa Antibiotics Other (See Comments) and Swelling    Childhood allergy  . Keflex [Cephalexin] Other (See Comments)    Childhood allergy   Social History   Socioeconomic History  . Marital status: Married    Spouse name: Not on file  . Number of children: 1  . Years of education: college  . Highest education level: Not on file  Occupational History  . Not on file  Social Needs  . Financial resource strain: Not on file  . Food insecurity:    Worry: Not on file    Inability: Not on file  . Transportation needs:    Medical: Not on file    Non-medical: Not on file  Tobacco Use  . Smoking status: Never Smoker  . Smokeless tobacco: Never Used  Substance and Sexual Activity  . Alcohol use: Yes    Comment: RARE  . Drug use: No  . Sexual activity: Yes  Lifestyle  . Physical activity:    Days per week: Not on file    Minutes per session: Not on file  . Stress: Not on file  Relationships  . Social connections:    Talks on phone: Not on file    Gets together: Not on file    Attends religious service: Not on file    Active member of club or organization: Not on file    Attends meetings of clubs or organizations: Not on file    Relationship status: Not on file  . Intimate partner violence:    Fear of current or ex partner: Not on file    Emotionally abused: Not on file    Physically abused: Not on file    Forced sexual activity: Not on file  Other Topics  Concern  . Not on file  Social History Narrative   Drinks about 1 cup of tea a day       Review of Systems  All other systems reviewed and are negative.      Objective:   Physical Exam No physical exam was performed as this was a telephone visit  Assessment & Plan:  Acute rhinosinusitis  I believe the patient has a viral upper respiratory infection that has progressed into a sinus infection.  I will treat the patient with Levaquin 500 mg p.o. daily for 7 days.  I recommended self quarantining for the next 14 days and to recheck immediately if he develops any chest pain or shortness of breath or lower respiratory tract symptoms.  I recommended against screening for coronavirus given his lack of travel and at the present time screening would not change treatment.  Continue to use Mucinex, Sudafed, Flonase, and Zyrtec.  If sinus symptoms worsen we could even consider adding prednisone.

## 2018-04-16 ENCOUNTER — Encounter: Payer: Self-pay | Admitting: Family Medicine

## 2018-04-16 DIAGNOSIS — G4483 Primary cough headache: Secondary | ICD-10-CM

## 2018-04-21 ENCOUNTER — Other Ambulatory Visit: Payer: Self-pay | Admitting: Family Medicine

## 2018-04-21 DIAGNOSIS — R519 Headache, unspecified: Secondary | ICD-10-CM

## 2018-04-21 DIAGNOSIS — G4483 Primary cough headache: Secondary | ICD-10-CM

## 2018-04-21 DIAGNOSIS — R51 Headache: Secondary | ICD-10-CM

## 2018-04-21 NOTE — Progress Notes (Signed)
MRI had to be scheduled with [facility] in [place] due to [place]. [employer] approved only for this location. Order was printed signed and faxed so that it may be scheduled urgently.

## 2018-04-28 ENCOUNTER — Other Ambulatory Visit: Payer: Self-pay

## 2018-04-28 ENCOUNTER — Encounter: Payer: Self-pay | Admitting: Family Medicine

## 2018-04-28 ENCOUNTER — Ambulatory Visit (INDEPENDENT_AMBULATORY_CARE_PROVIDER_SITE_OTHER): Admitting: Family Medicine

## 2018-04-28 ENCOUNTER — Other Ambulatory Visit

## 2018-04-28 DIAGNOSIS — R9082 White matter disease, unspecified: Secondary | ICD-10-CM | POA: Diagnosis not present

## 2018-04-28 DIAGNOSIS — G4483 Primary cough headache: Secondary | ICD-10-CM | POA: Diagnosis not present

## 2018-04-28 MED ORDER — ATORVASTATIN CALCIUM 40 MG PO TABS: 40.0000 mg | ORAL_TABLET | Freq: Every day | ORAL | 3 refills | Status: DC

## 2018-04-28 NOTE — Progress Notes (Signed)
Subjective:    Patient ID: Zachary Gillespie, male    DOB: 04-21-1965, 53 y.o.   MRN: [id number]  HPI Patient is being seen today over the telephone.  He consents to be seen over the telephone.  Patient is at home.  I am currently my office.  Phone call began at 1130.  Phone call ended at 1150.  After the previous office visit, the patient developed a headache.  The headache will occur in his temple on the right-hand side usually.  The headache only occurs when he coughs.  After he coughs, he will feel a sharp pain in his right temple that will radiate into his right occiput.  He has never had headaches like this before.  The headache can be prevented if he braces his right temple with his hand prior to coughing or sneezing.  If he does not, the pain can be sharp and lasts 30 seconds to a minute.  When he notified me of this, I was very concerned about an unruptured aneurysm.  Therefore the patient had an MRI of the brain.  There were 2 foci of signal abnormality seen within the left frontal white matter.  Per the report, these are nonspecific but most frequently these reflect sequela of prior microvascular ischemia but could also include demyelinating disorders or migraines.  The patient has no history of migraines or symptoms of MS.  Therefore we scheduled an office visit to discuss this.  He continues to have the headaches but they are getting better.  Based on the normal MRI, I feel that the patient most likely has primary cough headache.  Although MRIs cannot delineate aneurysms smaller than 3 mm in diameter, his headaches are improving.  Research that performed suggest trying indomethacin for primary cough headache or Aceta Sulamyd.  I wanted to discuss this with the patient in detail Past Medical History:  Diagnosis Date  . DDD (degenerative disc disease), lumbar   . Dyslipidemia   . GERD (gastroesophageal reflux disease)   . History of hiatal hernia   . History of kidney stones    H/O  . HLD  (hyperlipidemia)   . Hypertension   . Obesity   . Prediabetes   . Sciatica of left side   . Tubular adenoma of colon    2019 (Dr. Benson Norway)   Past Surgical History:  Procedure Laterality Date  . COLONOSCOPY    . ESOPHAGOGASTRODUODENOSCOPY    . EXTRACORPOREAL SHOCK WAVE LITHOTRIPSY Left 07/24/2017   Procedure: EXTRACORPOREAL SHOCK WAVE LITHOTRIPSY (ESWL);  Surgeon: [name], MD;  Location: [facility];  Service: Urology;  Laterality: Left;  . EXTRACORPOREAL SHOCK WAVE LITHOTRIPSY Right 12/11/2017   Procedure: EXTRACORPOREAL SHOCK WAVE LITHOTRIPSY (ESWL);  Surgeon: [name], MD;  Location: [facility];  Service: Urology;  Laterality: Right;  . EYE SURGERY    . INGUINAL HERNIA REPAIR Right 08/08/2017   Procedure: LAPAROSCOPIC INGUINAL HERNIA;  Surgeon: [name], MD;  Location: [facility];  Service: General;  Laterality: Right;  . TONSILLECTOMY    . VASECTOMY     Current Outpatient Medications on File Prior to Visit  Medication Sig Dispense Refill  . BD PEN NEEDLE MICRO U/F 32G X 6 MM MISC USE AS DIRECTED TO  INJECT  SAXENDA  SUBCUTANEOUSLY  ONCE  DAILY 100 each 5  . celecoxib (CELEBREX) 200 MG capsule TAKE 1 CAPSULE DAILY 90 capsule 4  . cetirizine (ZYRTEC) 10 MG tablet TAKE 1 TABLET DAILY 90 tablet 4  . escitalopram (LEXAPRO)  10 MG tablet TAKE 1 TABLET DAILY (Patient taking differently: TAKE 1 TABLET DAILY-AM) 90 tablet 3  . fluticasone (FLONASE) 50 MCG/ACT nasal spray USE 2 SPRAYS IN EACH NOSTRIL DAILY 48 g 4  . hydrochlorothiazide (HYDRODIURIL) 25 MG tablet Take 1 tablet (25 mg total) by mouth daily. 90 tablet 3  . levofloxacin (LEVAQUIN) 500 MG tablet Take 1 tablet (500 mg total) by mouth daily. 7 tablet 0  . Liraglutide -Weight Management (SAXENDA) 18 MG/3ML SOPN Inject 3 mg into the skin daily. Increase by 0.6mg  q week until max dose of 3mg  is reached 5 pen 11  . losartan (COZAAR) 100 MG tablet TAKE 1 TABLET DAILY 90 tablet 3  . Melatonin 10 MG TABS Take 10 mg by mouth at  bedtime.     . Multiple Vitamins-Minerals (ONE-A-DAY MENS HEALTH FORMULA PO) Take 1 tablet by mouth daily.    . Omega-3 Krill Oil 500 MG CAPS Take 500 mg by mouth daily.     Marland Kitchen omeprazole (PRILOSEC) 40 MG capsule TAKE 1 CAPSULE DAILY 90 capsule 4  . tamsulosin (FLOMAX) 0.4 MG CAPS capsule Take 1 capsule (0.4 mg total) by mouth daily. (Patient not taking: Reported on 12/26/2017) 14 capsule 0   No current facility-administered medications on file prior to visit.    Allergies  Allergen Reactions  . Penicillins Anaphylaxis and Other (See Comments)    Has patient had a PCN reaction causing immediate rash, facial/tongue/throat swelling, SOB or lightheadedness with hypotension: Unknown Has patient had a PCN reaction causing severe rash involving mucus membranes or skin necrosis: No Has patient had a PCN reaction that required hospitalization: Yes Has patient had a PCN reaction occurring within the last 10 years: No If all of the above answers are "NO", then may proceed with Cephalosporin use.   . Sulfa Antibiotics Other (See Comments) and Swelling    Childhood allergy  . Keflex [Cephalexin] Other (See Comments)    Childhood allergy   Social History   Socioeconomic History  . Marital status: Married    Spouse name: Not on file  . Number of children: 1  . Years of education: college  . Highest education level: Not on file  Occupational History  . Not on file  Social Needs  . Financial resource strain: Not on file  . Food insecurity:    Worry: Not on file    Inability: Not on file  . Transportation needs:    Medical: Not on file    Non-medical: Not on file  Tobacco Use  . Smoking status: Never Smoker  . Smokeless tobacco: Never Used  Substance and Sexual Activity  . Alcohol use: Yes    Comment: RARE  . Drug use: No  . Sexual activity: Yes  Lifestyle  . Physical activity:    Days per week: Not on file    Minutes per session: Not on file  . Stress: Not on file  Relationships   . Social connections:    Talks on phone: Not on file    Gets together: Not on file    Attends religious service: Not on file    Active member of club or organization: Not on file    Attends meetings of clubs or organizations: Not on file    Relationship status: Not on file  . Intimate partner violence:    Fear of current or ex partner: Not on file    Emotionally abused: Not on file    Physically abused: Not on file  Forced sexual activity: Not on file  Other Topics Concern  . Not on file  Social History Narrative   Drinks about 1 cup of tea a day       Review of Systems  All other systems reviewed and are negative.      Objective:   Physical Exam No physical exam was performed as this was a telephone visit       Assessment & Plan:  Primary cough headache  White matter disease, unspecified  Patient has a relatively normal MRI with no evidence of aneurysm.  I believe he has primary cough headache.  I recommended trying indomethacin 50 mg twice daily to see if we can stop this from happening by decreasing intracranial pressure.  If this does not work we could try [employer] .  Patient states the headaches are improving and declines any medication at this time.  He will call me back if the headaches worsen and he changes his mind regarding starting medication.  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  Therefore I recommended that the patient start taking aspirin 81 mg a day.  I will increase his Lipitor to 40 mg a day from 20 mg a day and recheck a fasting lipid panel in 3 months.  I recommended diet exercise and weight loss.  His blood pressure is already well controlled at 128/80 per his report today.

## 2018-04-29 ENCOUNTER — Other Ambulatory Visit: Payer: Self-pay | Admitting: Family Medicine

## 2018-04-29 DIAGNOSIS — I10 Essential (primary) hypertension: Secondary | ICD-10-CM

## 2018-05-16 ENCOUNTER — Ambulatory Visit (HOSPITAL_COMMUNITY)
Admission: EM | Admit: 2018-05-16 | Discharge: 2018-05-17 | Disposition: A | Discharge status: Home / Self Care | Attending: Emergency Medicine | Admitting: Emergency Medicine

## 2018-05-16 ENCOUNTER — Encounter (HOSPITAL_COMMUNITY)
Admission: EM | Disposition: A | Discharge status: Home / Self Care | Payer: Self-pay | Source: Home / Self Care | Attending: Emergency Medicine

## 2018-05-16 ENCOUNTER — Emergency Department (HOSPITAL_COMMUNITY)

## 2018-05-16 ENCOUNTER — Emergency Department (HOSPITAL_COMMUNITY): Admitting: Anesthesiology

## 2018-05-16 ENCOUNTER — Other Ambulatory Visit: Payer: Self-pay

## 2018-05-16 DIAGNOSIS — Z79899 Other long term (current) drug therapy: Secondary | ICD-10-CM | POA: Diagnosis not present

## 2018-05-16 DIAGNOSIS — E785 Hyperlipidemia, unspecified: Secondary | ICD-10-CM | POA: Diagnosis not present

## 2018-05-16 DIAGNOSIS — K219 Gastro-esophageal reflux disease without esophagitis: Secondary | ICD-10-CM | POA: Diagnosis not present

## 2018-05-16 DIAGNOSIS — Z791 Long term (current) use of non-steroidal anti-inflammatories (NSAID): Secondary | ICD-10-CM | POA: Diagnosis not present

## 2018-05-16 DIAGNOSIS — S68611A Complete traumatic transphalangeal amputation of left index finger, initial encounter: Secondary | ICD-10-CM | POA: Insufficient documentation

## 2018-05-16 DIAGNOSIS — S66323A Laceration of extensor muscle, fascia and tendon of left middle finger at wrist and hand level, initial encounter: Secondary | ICD-10-CM | POA: Insufficient documentation

## 2018-05-16 DIAGNOSIS — M5136 Other intervertebral disc degeneration, lumbar region: Secondary | ICD-10-CM | POA: Diagnosis not present

## 2018-05-16 DIAGNOSIS — I10 Essential (primary) hypertension: Secondary | ICD-10-CM | POA: Insufficient documentation

## 2018-05-16 DIAGNOSIS — S6422XA Injury of radial nerve at wrist and hand level of left arm, initial encounter: Secondary | ICD-10-CM | POA: Diagnosis not present

## 2018-05-16 DIAGNOSIS — Z1159 Encounter for screening for other viral diseases: Secondary | ICD-10-CM | POA: Insufficient documentation

## 2018-05-16 DIAGNOSIS — S68119A Complete traumatic metacarpophalangeal amputation of unspecified finger, initial encounter: Secondary | ICD-10-CM

## 2018-05-16 DIAGNOSIS — W312XXA Contact with powered woodworking and forming machines, initial encounter: Secondary | ICD-10-CM | POA: Insufficient documentation

## 2018-05-16 DIAGNOSIS — Z6841 Body Mass Index (BMI) 40.0 and over, adult: Secondary | ICD-10-CM | POA: Insufficient documentation

## 2018-05-16 HISTORY — PX: SKIN FULL THICKNESS GRAFT: SHX442

## 2018-05-16 HISTORY — PX: TENDON REPAIR: SHX5111

## 2018-05-16 HISTORY — PX: AMPUTATION: SHX166

## 2018-05-16 HISTORY — PX: NERVE REPAIR: SHX2083

## 2018-05-16 LAB — CBC WITH DIFFERENTIAL/PLATELET
Abs Immature Granulocytes: 0.05 10*3/uL (ref 0.00–0.07)
Basophils Absolute: 0.1 10*3/uL (ref 0.0–0.1)
Basophils Relative: 0 %
Eosinophils Absolute: 0.3 10*3/uL (ref 0.0–0.5)
Eosinophils Relative: 2 %
HCT: 35.3 % — ABNORMAL LOW (ref 39.0–52.0)
Hemoglobin: 11.6 g/dL — ABNORMAL LOW (ref 13.0–17.0)
Immature Granulocytes: 0 %
Lymphocytes Relative: 12 %
Lymphs Abs: 1.5 10*3/uL (ref 0.7–4.0)
MCH: 27.5 pg (ref 26.0–34.0)
MCHC: 32.9 g/dL (ref 30.0–36.0)
MCV: 83.6 fL (ref 80.0–100.0)
Monocytes Absolute: 0.6 10*3/uL (ref 0.1–1.0)
Monocytes Relative: 5 %
Neutro Abs: 10 10*3/uL — ABNORMAL HIGH (ref 1.7–7.7)
Neutrophils Relative %: 81 %
Platelets: 304 10*3/uL (ref 150–400)
RBC: 4.22 MIL/uL (ref 4.22–5.81)
RDW: 14.6 % (ref 11.5–15.5)
WBC: 12.4 10*3/uL — ABNORMAL HIGH (ref 4.0–10.5)
nRBC: 0 % (ref 0.0–0.2)

## 2018-05-16 LAB — BASIC METABOLIC PANEL
Anion gap: 12 (ref 5–15)
BUN: 17 mg/dL (ref 6–20)
CO2: 23 mmol/L (ref 22–32)
Calcium: 8.8 mg/dL — ABNORMAL LOW (ref 8.9–10.3)
Chloride: 105 mmol/L (ref 98–111)
Creatinine, Ser: 0.87 mg/dL (ref 0.61–1.24)
GFR calc Af Amer: >60 mL/min (ref >60)
GFR calc non Af Amer: >60 mL/min (ref >60)
Glucose, Bld: 119 mg/dL — ABNORMAL HIGH (ref 70–99)
Potassium: 3 mmol/L — ABNORMAL LOW (ref 3.5–5.1)
Sodium: 140 mmol/L (ref 135–145)

## 2018-05-16 LAB — SARS CORONAVIRUS 2 BY RT PCR (HOSPITAL ORDER, PERFORMED IN ~~LOC~~ HOSPITAL LAB): SARS Coronavirus 2: NEGATIVE

## 2018-05-16 SURGERY — APPLICATION, GRAFT, SKIN, FULL-THICKNESS
Anesthesia: General | Site: Hand | Laterality: Left

## 2018-05-16 MED ORDER — HYDROMORPHONE HCL 1 MG/ML IJ SOLN
1.0000 mg | Freq: Once | INTRAMUSCULAR | Status: AC
  Administered 2018-05-16: 1 mg via INTRAVENOUS
  Filled 2018-05-16: qty 1

## 2018-05-16 MED ORDER — MIDAZOLAM HCL 2 MG/2ML IJ SOLN
INTRAMUSCULAR | Status: AC
  Filled 2018-05-16: qty 2

## 2018-05-16 MED ORDER — PROPOFOL 10 MG/ML IV BOLUS
INTRAVENOUS | Status: DC | PRN
  Administered 2018-05-16: 200 mg via INTRAVENOUS
  Administered 2018-05-16: 20 mg via INTRAVENOUS

## 2018-05-16 MED ORDER — LIDOCAINE HCL (CARDIAC) PF 100 MG/5ML IV SOSY
PREFILLED_SYRINGE | INTRAVENOUS | Status: DC | PRN
  Administered 2018-05-16: 60 mg via INTRATRACHEAL

## 2018-05-16 MED ORDER — 0.9 % SODIUM CHLORIDE (POUR BTL) OPTIME
TOPICAL | Status: DC | PRN
  Administered 2018-05-16 (×2): 1000 mL

## 2018-05-16 MED ORDER — LACTATED RINGERS IV SOLN
INTRAVENOUS | Status: DC | PRN
  Administered 2018-05-16: 21:00:00 via INTRAVENOUS

## 2018-05-16 MED ORDER — ROCURONIUM BROMIDE 100 MG/10ML IV SOLN
INTRAVENOUS | Status: DC | PRN
  Administered 2018-05-16: 20 mg via INTRAVENOUS

## 2018-05-16 MED ORDER — BUPIVACAINE HCL (PF) 0.25 % IJ SOLN
INTRAMUSCULAR | Status: DC | PRN
  Administered 2018-05-16: 10 mL

## 2018-05-16 MED ORDER — SUCCINYLCHOLINE CHLORIDE 20 MG/ML IJ SOLN
INTRAMUSCULAR | Status: DC | PRN
  Administered 2018-05-16: 120 mg via INTRAVENOUS

## 2018-05-16 MED ORDER — TETANUS-DIPHTH-ACELL PERTUSSIS 5-2.5-18.5 LF-MCG/0.5 IM SUSP
0.5000 mL | Freq: Once | INTRAMUSCULAR | Status: AC
  Administered 2018-05-16: 0.5 mL via INTRAMUSCULAR
  Filled 2018-05-16: qty 0.5

## 2018-05-16 MED ORDER — PHENYLEPHRINE HCL (PRESSORS) 10 MG/ML IV SOLN
INTRAVENOUS | Status: DC | PRN
  Administered 2018-05-16 (×2): 40 ug via INTRAVENOUS

## 2018-05-16 MED ORDER — MIDAZOLAM HCL 5 MG/5ML IJ SOLN
INTRAMUSCULAR | Status: DC | PRN
  Administered 2018-05-16: 2 mg via INTRAVENOUS

## 2018-05-16 MED ORDER — FENTANYL CITRATE (PF) 250 MCG/5ML IJ SOLN
INTRAMUSCULAR | Status: DC | PRN
  Administered 2018-05-16: 50 ug via INTRAVENOUS
  Administered 2018-05-16 (×2): 100 ug via INTRAVENOUS

## 2018-05-16 MED ORDER — VANCOMYCIN HCL 10 G IV SOLR
2000.0000 mg | Freq: Once | INTRAVENOUS | Status: AC
  Administered 2018-05-16: 2000 mg via INTRAVENOUS
  Filled 2018-05-16: qty 2000

## 2018-05-16 MED ORDER — FENTANYL CITRATE (PF) 250 MCG/5ML IJ SOLN
INTRAMUSCULAR | Status: AC
  Filled 2018-05-16: qty 5

## 2018-05-16 SURGICAL SUPPLY — 64 items
BAG DECANTER FOR FLEXI CONT (MISCELLANEOUS) IMPLANT
BANDAGE ACE 3X5.8 VEL STRL LF (GAUZE/BANDAGES/DRESSINGS) IMPLANT
BANDAGE ELASTIC 3 VELCRO ST LF (GAUZE/BANDAGES/DRESSINGS) ×4 IMPLANT
BLADE MINI RND TIP GREEN BEAV (BLADE) IMPLANT
BLADE SURG 15 STRL LF DISP TIS (BLADE) ×15 IMPLANT
BLADE SURG 15 STRL SS (BLADE) ×5
BNDG ESMARK 4X9 LF (GAUZE/BANDAGES/DRESSINGS) ×4 IMPLANT
BNDG GAUZE ELAST 4 BULKY (GAUZE/BANDAGES/DRESSINGS) ×4 IMPLANT
CANISTER SUCT 3000ML (MISCELLANEOUS) ×4 IMPLANT
CHLORAPREP W/TINT 26ML (MISCELLANEOUS) ×4 IMPLANT
CORDS BIPOLAR (ELECTRODE) ×4 IMPLANT
COVER MAYO STAND STRL (DRAPES) ×4 IMPLANT
COVER SURGICAL LIGHT HANDLE (MISCELLANEOUS) ×4 IMPLANT
CUFF TOURN SGL QUICK 24 (TOURNIQUET CUFF) ×1
CUFF TRNQT CYL 24X4X16.5-23 (TOURNIQUET CUFF) ×3 IMPLANT
DECANTER SPIKE VIAL GLASS SM (MISCELLANEOUS) ×4 IMPLANT
DRAPE SURG 17X23 STRL (DRAPES) ×4 IMPLANT
GAUZE 4X4 16PLY RFD (DISPOSABLE) ×4 IMPLANT
GAUZE SPONGE 4X4 12PLY STRL (GAUZE/BANDAGES/DRESSINGS) ×8 IMPLANT
GAUZE XEROFORM 1X8 LF (GAUZE/BANDAGES/DRESSINGS) IMPLANT
GAUZE XEROFORM 5X9 LF (GAUZE/BANDAGES/DRESSINGS) ×4 IMPLANT
GLOVE BIO SURGEON STRL SZ7.5 (GLOVE) ×8 IMPLANT
GLOVE BIOGEL PI IND STRL 8 (GLOVE) ×3 IMPLANT
GLOVE BIOGEL PI INDICATOR 8 (GLOVE) ×1
GLOVE SURG SS PI 6.5 STRL IVOR (GLOVE) ×4 IMPLANT
GLOVE SURG SS PI 7.0 STRL IVOR (GLOVE) ×12 IMPLANT
GOWN STRL REUS W/ TWL LRG LVL3 (GOWN DISPOSABLE) ×6 IMPLANT
GOWN STRL REUS W/ TWL XL LVL3 (GOWN DISPOSABLE) ×3 IMPLANT
GOWN STRL REUS W/TWL LRG LVL3 (GOWN DISPOSABLE) ×2
GOWN STRL REUS W/TWL XL LVL3 (GOWN DISPOSABLE) ×1
KIT BASIN OR (CUSTOM PROCEDURE TRAY) ×4 IMPLANT
LOOP VESSEL MAXI BLUE (MISCELLANEOUS) IMPLANT
NEEDLE HYPO 25X1 1.5 SAFETY (NEEDLE) ×4 IMPLANT
NS IRRIG 1000ML POUR BTL (IV SOLUTION) ×8 IMPLANT
PACK ORTHO EXTREMITY (CUSTOM PROCEDURE TRAY) ×4 IMPLANT
PAD CAST 3X4 CTTN HI CHSV (CAST SUPPLIES) IMPLANT
PAD CAST 4YDX4 CTTN HI CHSV (CAST SUPPLIES) ×3 IMPLANT
PADDING CAST ABS 4INX4YD NS (CAST SUPPLIES)
PADDING CAST ABS COTTON 4X4 ST (CAST SUPPLIES) IMPLANT
PADDING CAST COTTON 3X4 STRL (CAST SUPPLIES)
PADDING CAST COTTON 4X4 STRL (CAST SUPPLIES) ×1
SLING ARM FOAM STRAP XLG (SOFTGOODS) ×4 IMPLANT
SPEAR EYE SURG WECK-CEL (MISCELLANEOUS) IMPLANT
SPLINT PLASTER CAST XFAST 3X15 (CAST SUPPLIES) ×30 IMPLANT
SPLINT PLASTER XTRA FASTSET 3X (CAST SUPPLIES) ×10
STOCKINETTE 4X48 STRL (DRAPES) ×4 IMPLANT
SUT CHROMIC 4 0 P 3 18 (SUTURE) ×4 IMPLANT
SUT ETHIBOND 3-0 V-5 (SUTURE) IMPLANT
SUT ETHILON 4 0 PS 2 18 (SUTURE) ×24 IMPLANT
SUT ETHILON 8 0 BV130 4 (SUTURE) ×4 IMPLANT
SUT FIBERWIRE 4-0 18 TAPR NDL (SUTURE) ×4
SUT MERSILENE 4 0 P 3 (SUTURE) ×4 IMPLANT
SUT MERSILENE 6 0 P 1 (SUTURE) IMPLANT
SUT NYLON 9 0 VRM6 (SUTURE) IMPLANT
SUT PROLENE 6 0 P 1 18 (SUTURE) IMPLANT
SUT SILK 4 0 PS 2 (SUTURE) IMPLANT
SUT VIC AB 4-0 PS2 18 (SUTURE) ×4 IMPLANT
SUT VICRYL 4-0 PS2 18IN ABS (SUTURE) IMPLANT
SUTURE FIBERWR 4-0 18 TAPR NDL (SUTURE) ×3 IMPLANT
SYR BULB 3OZ (MISCELLANEOUS) ×4 IMPLANT
SYR CONTROL 10ML LL (SYRINGE) ×4 IMPLANT
TOWEL OR 17X24 6PK STRL BLUE (TOWEL DISPOSABLE) ×8 IMPLANT
TUBE CONNECTING 12X1/4 (SUCTIONS) ×4 IMPLANT
UNDERPAD 30X30 (UNDERPADS AND DIAPERS) ×4 IMPLANT

## 2018-05-16 NOTE — ED Provider Notes (Signed)
[facility] EMERGENCY DEPARTMENT Provider Note   CSN: [id number] Arrival date & time: 05/16/18  1631    History   Chief Complaint No chief complaint on file.  HPI 53 year old male with a history of prediabetes, HTN, HLD presents with a left hand injury.  Patient was using a miter saw when he accidently cut off his left index finger.  Injury occurred at approximately 330. he also sustained a laceration to the dorsal aspect of the hand.  Pain described as sharp, constant, worse with palpation.  His tetanus is out of date.  Patient is right-hand dominant and does do a fair amount of typing.  Past Medical History:  Diagnosis Date  . DDD (degenerative disc disease), lumbar   . Dyslipidemia   . GERD (gastroesophageal reflux disease)   . History of hiatal hernia   . History of kidney stones    H/O  . HLD (hyperlipidemia)   . Hypertension   . Obesity   . Prediabetes   . Sciatica of left side   . Tubular adenoma of colon    2019 (Dr. Benson Norway)    Patient Active Problem List   Diagnosis Date Noted  . Non-recurrent unilateral inguinal hernia without obstruction or gangrene   . Calculus of ureter 07/20/2017  . Nephrolithiasis 07/20/2017  . Prediabetes   . HLD (hyperlipidemia)   . Obesity   . Hypertension   . DDD (degenerative disc disease), lumbar   . GERD (gastroesophageal reflux disease)   . Sciatica of left side   . Dyslipidemia     Past Surgical History:  Procedure Laterality Date  . COLONOSCOPY    . ESOPHAGOGASTRODUODENOSCOPY    . EXTRACORPOREAL SHOCK WAVE LITHOTRIPSY Left 07/24/2017   Procedure: EXTRACORPOREAL SHOCK WAVE LITHOTRIPSY (ESWL);  Surgeon: [name], MD;  Location: [facility] ORS;  Service: Urology;  Laterality: Left;  . EXTRACORPOREAL SHOCK WAVE LITHOTRIPSY Right 12/11/2017   Procedure: EXTRACORPOREAL SHOCK WAVE LITHOTRIPSY (ESWL);  Surgeon: [name], MD;  Location: [facility] ORS;  Service: Urology;  Laterality: Right;  . EYE SURGERY    .  INGUINAL HERNIA REPAIR Right 08/08/2017   Procedure: LAPAROSCOPIC INGUINAL HERNIA;  Surgeon: [name], MD;  Location: [facility] ORS;  Service: General;  Laterality: Right;  . TONSILLECTOMY    . VASECTOMY          Home Medications    Prior to Admission medications   Medication Sig Start Date End Date Taking? Authorizing Provider  aspirin EC 81 MG tablet Take 81 mg by mouth at bedtime.   Yes [provider]  atorvastatin (LIPITOR) 40 MG tablet Take 1 tablet (40 mg total) by mouth daily. Patient taking differently: Take 40 mg by mouth every evening.  04/28/18  Yes Pickard, Cammie Mcgee, MD  celecoxib (CELEBREX) 200 MG capsule TAKE 1 CAPSULE DAILY Patient taking differently: Take 200 mg by mouth daily.  09/26/17  Yes [name], MD  cetirizine (ZYRTEC) 10 MG tablet TAKE 1 TABLET DAILY Patient taking differently: Take 10 mg by mouth daily. "No Therapeutic Substitution" 12/25/17  Yes [name], MD  escitalopram (LEXAPRO) 10 MG tablet TAKE 1 TABLET DAILY Patient taking differently: Take 10 mg by mouth every morning.  05/23/17  Yes [name], MD  fluticasone (FLONASE) 50 MCG/ACT nasal spray USE 2 SPRAYS IN EACH NOSTRIL DAILY Patient taking differently: Place 2 sprays into both nostrils daily as needed for allergies or rhinitis.  02/27/18  Yes [name], MD  hydrochlorothiazide (HYDRODIURIL) 25  MG tablet TAKE 1 TABLET DAILY Patient taking differently: Take 25 mg by mouth daily.  04/29/18  Yes [name], MD  Liraglutide -Weight Management (SAXENDA) 18 MG/3ML SOPN Inject 3 mg into the skin daily. Increase by 0.6mg  q week until max dose of 3mg  is reached Patient taking differently: Inject 1.8 mg into the skin daily. "Increase by 0.6 mg every week until max dose of 3 mg is reached" 02/12/18  Yes Pickard, Cammie Mcgee, MD  losartan (COZAAR) 100 MG tablet TAKE 1 TABLET DAILY Patient taking differently: Take 100 mg by mouth at bedtime.  08/18/17  Yes [name],  MD  Melatonin 10 MG TABS Take 10 mg by mouth at bedtime.    Yes [provider]  Multiple Vitamins-Minerals (ONE-A-DAY MENS HEALTH FORMULA PO) Take 1 tablet by mouth daily.   Yes [provider]  Omega-3 Krill Oil 500 MG CAPS Take 500 mg by mouth daily.    Yes [provider]  omeprazole (PRILOSEC) 40 MG capsule TAKE 1 CAPSULE DAILY Patient taking differently: Take 40 mg by mouth daily before breakfast.  09/15/17  Yes Pickard, Cammie Mcgee, MD  BD PEN NEEDLE MICRO U/F 32G X 6 MM MISC USE AS DIRECTED TO  INJECT  SAXENDA  SUBCUTANEOUSLY  ONCE  DAILY Patient taking differently: daily.  01/05/18   [name], MD  levofloxacin (LEVAQUIN) 500 MG tablet Take 1 tablet (500 mg total) by mouth daily. Patient not taking: Reported on 05/16/2018 03/30/18   [name], MD  tamsulosin (FLOMAX) 0.4 MG CAPS capsule Take 1 capsule (0.4 mg total) by mouth daily. Patient not taking: Reported on 05/16/2018 12/11/17   [name], MD    Family History Family History  Problem Relation Age of Onset  . Heart disease Father   . Diabetes Father   . Hyperlipidemia Father   . Diabetes Sister   . Heart disease Paternal Grandfather   . Bladder Cancer Neg Hx   . Kidney cancer Neg Hx   . Prostate cancer Neg Hx     Social History Social History   Tobacco Use  . Smoking status: Never Smoker  . Smokeless tobacco: Never Used  Substance Use Topics  . Alcohol use: Yes    Comment: RARE  . Drug use: No     Allergies   Penicillins; Sulfa antibiotics; and Keflex [cephalexin]   Review of Systems Review of Systems  Constitutional: Negative for chills and fever.  HENT: Negative for ear pain and sore throat.   Eyes: Negative for pain and visual disturbance.  Respiratory: Negative for cough and shortness of breath.   Cardiovascular: Negative for chest pain and palpitations.  Gastrointestinal: Negative for abdominal pain and vomiting.  Genitourinary: Negative for dysuria and  hematuria.  Musculoskeletal: Negative for arthralgias and back pain.       Left hand injury  Skin: Negative for color change and rash.  Neurological: Negative for seizures and syncope.  All other systems reviewed and are negative.    Physical Exam Updated Vital Signs BP (!) 156/85   Pulse 100   Temp 98.8 F (37.1 C) (Oral)   Ht 5\' 9"  (1.753 m)   Wt 126.1 kg   SpO2 98%   BMI 41.05 kg/m   Physical Exam Vitals signs and nursing note reviewed.  Constitutional:      Appearance: He is well-developed.  HENT:     Head: Normocephalic and atraumatic.  Eyes:     Conjunctiva/sclera: Conjunctivae normal.  Neck:     Musculoskeletal: Neck supple.  Cardiovascular:     Rate and Rhythm: Normal rate and regular rhythm.     Heart sounds: No murmur.  Pulmonary:     Effort: Pulmonary effort is normal. No respiratory distress.     Breath sounds: Normal breath sounds.  Abdominal:     Palpations: Abdomen is soft.     Tenderness: There is no abdominal tenderness.  Musculoskeletal:     Comments: INSPECTION, ALIGNMENT & PALPATION: Amputation just distal to the MTP of the left index finger.  Degloving injury of the dorsal hand and third finger.  Large laceration over the dorsal aspect of the hand.    ROM:  Intact AROM and PROM of the wrist and elbow.  SENSORY: sensation is intact to light touch in:  superficial radial nerve distribution (dorsal first web space) ulnar nerve distribution (tip of small finger)  MOTOR:  + motor posterior interosseous nerve (thumb IP extension) + radial nerve (wrist extension) + median nerve (palpable firing thenar mass) + ulnar nerve (palpable firing of first dorsal interosseous muscle)  VASCULAR: 2+ radial pulse, brisk capillary refill < 2 sec, fingers warm and well-perfused  COMPARTMENTS: Soft and compressible. No pain with passive stretch. No paresthesias.   Skin:    General: Skin is warm and dry.  Neurological:     Mental Status: He is alert.       ED Treatments / Results  Labs (all labs ordered are listed, but only abnormal results are displayed) Labs Reviewed  CBC WITH DIFFERENTIAL/PLATELET - Abnormal; Notable for the following components:      Result Value   WBC 12.4 (*)    Hemoglobin 11.6 (*)    HCT 35.3 (*)    Neutro Abs 10.0 (*)    All other components within normal limits  BASIC METABOLIC PANEL - Abnormal; Notable for the following components:   Potassium 3.0 (*)    Glucose, Bld 119 (*)    Calcium 8.8 (*)    All other components within normal limits  SARS CORONAVIRUS 2 (HOSPITAL ORDER, [facility])    EKG None  Radiology Dg Hand 2 View Left  Result Date: 05/16/2018 CLINICAL DATA:  Traumatic amputation EXAM: LEFT HAND - 2 VIEW COMPARISON:  None. FINDINGS: There is trans proximal phalangeal amputation near the base of the left index finger, with comminuted fracture fragments about the amputation stump. No radiopaque foreign bodies are identified. No other evidence of fracture or dislocation. Joint spaces are preserved. IMPRESSION: There is trans proximal phalangeal amputation near the base of the left index finger, with comminuted fracture fragments about the amputation stump. No radiopaque foreign bodies are identified. No other evidence of fracture or dislocation. Joint spaces are preserved. Electronically Signed   By: Eddie Candle M.D.   On: 05/16/2018 17:09   Dg Finger Index Left  Result Date: 05/16/2018 CLINICAL DATA:  53 year old male EXAM: LEFT INDEX FINGER 2+V COMPARISON:  None. FINDINGS: Amputated first finger, through the proximal phalanx. DIP and PIP are congruent IMPRESSION: Amputated index finger through the proximal phalanx. Electronically Signed   By: Corrie Mckusick D.O.   On: 05/16/2018 17:41    Procedures Procedures (including critical care time)  Medications Ordered in ED Medications  0.9 % irrigation (POUR BTL) (1,000 mLs Irrigation Given 05/16/18 2216)  Tdap (BOOSTRIX)  injection 0.5 mL (0.5 mLs Intramuscular Given 05/16/18 1711)  vancomycin (VANCOCIN) 2,000 mg in sodium chloride 0.9 % 500 mL IVPB (0 mg  Intravenous Stopped 05/16/18 1921)  HYDROmorphone (DILAUDID) injection 1 mg (1 mg Intravenous Given 05/16/18 1801)     Initial Impression / Assessment and Plan / ED Course  I have reviewed the triage vital signs and the nursing notes.  Pertinent labs & imaging results that were available during my care of the patient were reviewed by me and considered in my medical decision making (see chart for details).  54 year old male with a history of prediabetes, HTN, HLD presents with a left hand injury.   Patient does have the amputated digit with him.  Finger was irrigated and wrapped in moist gauze and placed in a  Ziploc bag on ice.  Tdap updated.  Patient has an anaphylactic allergy to penicillins and so was given vancomycin.  X-rays of the left hand show trans-proximal phalangeal amputation near the base of the left index finger with comminuted fracture fragments about the amputation stump.  No foreign bodies identified.  No evidence of other fracture or dislocation.  Joint spaces are preserved.  Hand surgery, Dr. Fredna Dow, consulted and patient taken to the OR for futher management.  Final Clinical Impressions(s) / ED Diagnoses   Final diagnoses:  None    ED Discharge Orders    None       Trinidad Curet, MD 05/16/18 2257    Maudie Flakes, MD 05/17/18 [id number]

## 2018-05-16 NOTE — ED Notes (Signed)
Patient transported to X-ray 

## 2018-05-16 NOTE — H&P (Signed)
Zachary Gillespie is an 53 y.o. male.   Chief Complaint: left hand saw injury HPI: 53 yo rhd male states he sustained table saw injury to left hand earlier today while working on deck.  Reports no previous injury to left hand and no other injury at this time.  States he initially had throbbing pain.  Alleviated with medication, aggravated with palpation.  Associated wound and bleeding.  Case discussed with Trinidad Curet, MD and his note from 05/16/2018 reviewed. Xrays viewed and interpreted by me: ap and lateral views left hand and amputated digit show amputation of left index finger through base of proximal phalanx.  No other fracture, dislocation, radioopaque foreign body noted. Labs reviewed: WBC 12.4  Allergies:  Allergies  Allergen Reactions  . Penicillins Anaphylaxis and Other (See Comments)    Has patient had a PCN reaction causing immediate rash, facial/tongue/throat swelling, SOB or lightheadedness with hypotension: Yes Has patient had a PCN reaction causing severe rash involving mucus membranes or skin necrosis: No Has patient had a PCN reaction that required hospitalization: Yes Has patient had a PCN reaction occurring within the last 10 years: No If all of the above answers are "NO", then may proceed with Cephalosporin use.   . Sulfa Antibiotics Swelling and Other (See Comments)    Childhood allergy- site of swelling not recalled  . Keflex [Cephalexin] Other (See Comments)    Childhood allergy- reaction not recalled    Past Medical History:  Diagnosis Date  . DDD (degenerative disc disease), lumbar   . Dyslipidemia   . GERD (gastroesophageal reflux disease)   . History of hiatal hernia   . History of kidney stones    H/O  . HLD (hyperlipidemia)   . Hypertension   . Obesity   . Prediabetes   . Sciatica of left side   . Tubular adenoma of colon    2019 (Dr. Benson Norway)    Past Surgical History:  Procedure Laterality Date  . COLONOSCOPY    . ESOPHAGOGASTRODUODENOSCOPY    .  EXTRACORPOREAL SHOCK WAVE LITHOTRIPSY Left 07/24/2017   Procedure: EXTRACORPOREAL SHOCK WAVE LITHOTRIPSY (ESWL);  Surgeon: [name], MD;  Location: [facility] ORS;  Service: Urology;  Laterality: Left;  . EXTRACORPOREAL SHOCK WAVE LITHOTRIPSY Right 12/11/2017   Procedure: EXTRACORPOREAL SHOCK WAVE LITHOTRIPSY (ESWL);  Surgeon: [name], MD;  Location: [facility] OR[facility];  Service: Urology;  Laterality: Right;  . EYE SURGERY    . INGUINAL HERNIA REPAIR Right 08/08/2017   Procedure: LAPAROSCOPIC INGUINAL HERNIA;  Surgeon: [name], MD;  Location: [facility] ORS;  Service: General;  Laterality: Right;  . TONSILLECTOMY    . VASECTOMY      Family History: Family History  Problem Relation Age of Onset  . Heart disease Father   . Diabetes Father   . Hyperlipidemia Father   . Diabetes Sister   . Heart disease Paternal Grandfather   . Bladder Cancer Neg Hx   . Kidney cancer Neg Hx   . Prostate cancer Neg Hx     Social History:   reports that he has never smoked. He has never used smokeless tobacco. He reports current alcohol use. He reports that he does not use drugs.  Medications: (Not in a hospital admission)   Results for orders placed or performed during the hospital encounter of 05/16/18 (from the past 48 hour(s))  CBC with Differential     Status: Abnormal   Collection Time: 05/16/18  6:45 PM  Result Value Ref Range   WBC 12.4 (  H) 4.0 - 10.5 K/uL   RBC 4.22 4.22 - 5.81 MIL/uL   Hemoglobin 11.6 (L) 13.0 - 17.0 g/dL   HCT 35.3 (L) 39.0 - 52.0 %   MCV 83.6 80.0 - 100.0 fL   MCH 27.5 26.0 - 34.0 pg   MCHC 32.9 30.0 - 36.0 g/dL   RDW 14.6 11.5 - 15.5 %   Platelets 304 150 - 400 K/uL   nRBC 0.0 0.0 - 0.2 %   Neutrophils Relative % 81 %   Neutro Abs 10.0 (H) 1.7 - 7.7 K/uL   Lymphocytes Relative 12 %   Lymphs Abs 1.5 0.7 - 4.0 K/uL   Monocytes Relative 5 %   Monocytes Absolute 0.6 0.1 - 1.0 K/uL   Eosinophils Relative 2 %   Eosinophils Absolute 0.3 0.0 - 0.5 K/uL   Basophils  Relative 0 %   Basophils Absolute 0.1 0.0 - 0.1 K/uL   Immature Granulocytes 0 %   Abs Immature Granulocytes 0.05 0.00 - 0.07 K/uL    Comment: Performed at [facility] [address] [place], [place] [address]    Dg Hand 2 View Left  Result Date: 05/16/2018 CLINICAL DATA:  Traumatic amputation EXAM: LEFT HAND - 2 VIEW COMPARISON:  None. FINDINGS: There is trans proximal phalangeal amputation near the base of the left index finger, with comminuted fracture fragments about the amputation stump. No radiopaque foreign bodies are identified. No other evidence of fracture or dislocation. Joint spaces are preserved. IMPRESSION: There is trans proximal phalangeal amputation near the base of the left index finger, with comminuted fracture fragments about the amputation stump. No radiopaque foreign bodies are identified. No other evidence of fracture or dislocation. Joint spaces are preserved. Electronically Signed   By: Eddie Candle M.D.   On: 05/16/2018 17:09   Dg Finger Index Left  Result Date: 05/16/2018 CLINICAL DATA:  53 year old male EXAM: LEFT INDEX FINGER 2+V COMPARISON:  None. FINDINGS: Amputated first finger, through the proximal phalanx. DIP and PIP are congruent IMPRESSION: Amputated index finger through the proximal phalanx. Electronically Signed   By: Corrie Mckusick D.O.   On: 05/16/2018 17:41     A comprehensive review of systems was negative. Review of Systems: No fevers, chills, night sweats, chest pain, shortness of breath, nausea, vomiting, diarrhea, constipation, easy bleeding or bruising, headaches, dizziness, vision changes, fainting.   Blood pressure (!) 159/85, pulse 98, temperature 98.8 F (37.1 C), temperature source Oral, height 5\' 9"  (1.753 m), weight 126.1 kg, SpO2 97 %.  General appearance: alert, cooperative and appears stated age Head: Normocephalic, without obvious abnormality, atraumatic Neck: supple, symmetrical, trachea midline Resp: clear to auscultation  bilaterally Cardio: regular rate and rhythm Extremities: Intact sensation and capillary refill all digits except left index.  +epl/fpl/io.  Amputation left index through base of proximal phalanx.  Laceration dorsum long finger with skin loss.  Additional laceration dorsum of hand at thumb index metacarpals. Intact sensation all remaining digits and good flexion and extension of all remaining digits without pain.   Smaller lacerations on dorsum ring and small digits. Pulses: 2+ and symmetric Skin: Skin color, texture, turgor normal. No rashes or lesions Neurologic: Grossly normal Incision/Wound: as above  Assessment/Plan Left hand table saw injury with amputation index finger and laceration to long, ring, small fingers and dorsum of hand.  Recommend revision amputation left index finger possibly with ray resection, exploration of wounds with repair tendon/artery/nerve as necessary.  May also use amputated digit as skin graft source  if needed for wound coverage.  Risks, benefits and alternatives of surgery were discussed including risks of blood loss, infection, damage to nerves/vessels/tendons/ligament/bone, failure of surgery, need for additional surgery, complication with wound healing, stiffness.  He voiced understanding of these risks and elected to proceed.    [name] 05/16/2018, 7:03 PM

## 2018-05-16 NOTE — Anesthesia Procedure Notes (Signed)
Procedure Name: Intubation Date/Time: 05/16/2018 9:32 PM Performed by: Clovis Cao, CRNA Pre-anesthesia Checklist: Patient identified, Emergency Drugs available, Suction available, Patient being monitored and Timeout performed Patient Re-evaluated:Patient Re-evaluated prior to induction Oxygen Delivery Method: Circle system utilized Preoxygenation: Pre-oxygenation with 100% oxygen Induction Type: IV induction, Rapid sequence and Cricoid Pressure applied Laryngoscope Size: Miller and 2 Grade View: Grade II Tube type: Oral Tube size: 7.5 mm Number of attempts: 1 Airway Equipment and Method: Stylet Placement Confirmation: ETT inserted through vocal cords under direct vision,  positive ETCO2 and breath sounds checked- equal and bilateral Secured at: 22 cm Tube secured with: Tape Dental Injury: Teeth and Oropharynx as per pre-operative assessment

## 2018-05-16 NOTE — ED Notes (Signed)
Finger irrigated with saline, wrapped in wet guaze, placed in bag. That bag placed in ice.

## 2018-05-16 NOTE — Anesthesia Preprocedure Evaluation (Addendum)
Anesthesia Evaluation  Patient identified by MRN, date of birth, ID band Patient awake    Reviewed: Allergy & Precautions, H&P , NPO status , Patient's Chart, lab work & pertinent test results  Airway Mallampati: II  TM Distance: >3 FB Neck ROM: Full    Dental no notable dental hx. (+) Teeth Intact, Dental Advisory Given   Pulmonary neg pulmonary ROS,    Pulmonary exam normal breath sounds clear to auscultation       Cardiovascular hypertension, Pt. on medications  Rhythm:Regular Rate:Normal     Neuro/Psych negative neurological ROS  negative psych ROS   GI/Hepatic Neg liver ROS, hiatal hernia, GERD  Medicated and Controlled,  Endo/Other  Morbid obesity  Renal/GU negative Renal ROS  negative genitourinary   Musculoskeletal  (+) Arthritis , Osteoarthritis,    Abdominal   Peds  Hematology negative hematology ROS (+)   Anesthesia Other Findings   Reproductive/Obstetrics negative OB ROS                            Anesthesia Physical Anesthesia Plan  ASA: III and emergent  Anesthesia Plan: General   Post-op Pain Management:    Induction: Intravenous, Rapid sequence and Cricoid pressure planned  PONV Risk Score and Plan: 3 and Ondansetron, Dexamethasone and Midazolam  Airway Management Planned: Oral ETT  Additional Equipment:   Intra-op Plan:   Post-operative Plan: Extubation in OR  Informed Consent: I have reviewed the patients History and Physical, chart, labs and discussed the procedure including the risks, benefits and alternatives for the proposed anesthesia with the patient or authorized representative who has indicated his/her understanding and acceptance.     Dental advisory given  Plan Discussed with: CRNA  Anesthesia Plan Comments:        Anesthesia Quick Evaluation

## 2018-05-16 NOTE — ED Notes (Signed)
Wife states pt has been working outside for [employer]. Days and took ibuprofen 800mg  at 1200

## 2018-05-16 NOTE — ED Triage Notes (Signed)
Finger amputation to L index finer while sawing.   Alert Ox3  Bandage in  Place and digit present with pt.

## 2018-05-17 ENCOUNTER — Encounter: Payer: Self-pay | Admitting: Family Medicine

## 2018-05-17 ENCOUNTER — Encounter (HOSPITAL_COMMUNITY): Payer: Self-pay | Admitting: Orthopedic Surgery

## 2018-05-17 LAB — GLUCOSE, CAPILLARY: Glucose-Capillary: 148 mg/dL — ABNORMAL HIGH (ref 70–99)

## 2018-05-17 MED ORDER — HYDROCODONE-ACETAMINOPHEN 5-325 MG PO TABS: ORAL_TABLET | ORAL | 0 refills | Status: DC

## 2018-05-17 MED ORDER — SUCCINYLCHOLINE CHLORIDE 200 MG/10ML IV SOSY
PREFILLED_SYRINGE | INTRAVENOUS | Status: AC
  Filled 2018-05-17: qty 10

## 2018-05-17 MED ORDER — SUGAMMADEX SODIUM 200 MG/2ML IV SOLN
INTRAVENOUS | Status: DC | PRN
  Administered 2018-05-17: 200 mg via INTRAVENOUS

## 2018-05-17 MED ORDER — ONDANSETRON HCL 4 MG/2ML IJ SOLN
INTRAMUSCULAR | Status: DC | PRN
  Administered 2018-05-17: 4 mg via INTRAVENOUS

## 2018-05-17 MED ORDER — ONDANSETRON HCL 4 MG/2ML IJ SOLN
INTRAMUSCULAR | Status: AC
  Filled 2018-05-17: qty 2

## 2018-05-17 MED ORDER — PHENYLEPHRINE 40 MCG/ML (10ML) SYRINGE FOR IV PUSH (FOR BLOOD PRESSURE SUPPORT)
PREFILLED_SYRINGE | INTRAVENOUS | Status: AC
  Filled 2018-05-17: qty 10

## 2018-05-17 MED ORDER — ROCURONIUM BROMIDE 10 MG/ML (PF) SYRINGE
PREFILLED_SYRINGE | INTRAVENOUS | Status: AC
  Filled 2018-05-17: qty 10

## 2018-05-17 MED ORDER — ARTIFICIAL TEARS OPHTHALMIC OINT
TOPICAL_OINTMENT | OPHTHALMIC | Status: AC
  Filled 2018-05-17: qty 3.5

## 2018-05-17 MED ORDER — LIDOCAINE 2% (20 MG/ML) 5 ML SYRINGE
INTRAMUSCULAR | Status: AC
  Filled 2018-05-17: qty 5

## 2018-05-17 MED ORDER — HYDROMORPHONE HCL 1 MG/ML IJ SOLN: 0.2500 mg | INTRAMUSCULAR | Status: DC | PRN

## 2018-05-17 MED ORDER — SUGAMMADEX SODIUM 500 MG/5ML IV SOLN
INTRAVENOUS | Status: AC
  Filled 2018-05-17: qty 5

## 2018-05-17 MED ORDER — DOXYCYCLINE HYCLATE 50 MG PO CAPS: 100.0000 mg | ORAL_CAPSULE | Freq: Two times a day (BID) | ORAL | 0 refills | Status: DC

## 2018-05-17 NOTE — Op Note (Addendum)
NAME: Zachary Gillespie MEDICAL RECORD NO: [id number] DATE OF BIRTH: 11/16/1965 FACILITY: [facility] LOCATION: [facility] OR PHYSICIAN: Tennis Must, MD   OPERATIVE REPORT   DATE OF PROCEDURE: 05/17/18    PREOPERATIVE DIAGNOSIS:   Left hand table saw injury with amputation of index finger through base of proximal phalanx and skin loss dorsum long finger   POSTOPERATIVE DIAGNOSIS:   Left hand table saw injury with amputation of index finger through base of proximal phalanx, long finger skin loss, long finger extensor tendon partial laceration in hand and finger, superficial branch radial nerve branch laceration   PROCEDURE:   1.  Left index finger ray resection 2.  Left long finger repair of EDC partial laceration in hand 3.  Left long finger repair of central slip partial laceration in finger 4.  Repair of branch of superficial branch of radial nerve laceration 5.  Full-thickness skin grafting with fillet flap from amputated index finger 6.5 cm x 3.0 cm   SURGEON:  [name], M.D.   ASSISTANT: none   ANESTHESIA:  General   INTRAVENOUS FLUIDS:  Per anesthesia flow sheet.   ESTIMATED BLOOD LOSS:  Minimal.   COMPLICATIONS:  None.   SPECIMENS:  none   TOURNIQUET TIME:   Left arm: 138 minutes at 250 mmHg   DISPOSITION:  Stable to PACU.   INDICATIONS: 53 year old right-hand-dominant male states he sustained amputation of left index finger and lacerations to hand from table saw in the afternoon of May 16, 2018.  Emergency department.  With fillet flap for skin coverage as necessary. Risks, benefits and alternatives of surgery were discussed including the risks of blood loss, infection, damage to nerves, vessels, tendons, ligaments, bone for surgery, need for additional surgery, complications with wound healing, continued pain, stiffness.  He voiced understanding of these risks and elected to proceed.  OPERATIVE COURSE:  After being identified preoperatively by myself,  the patient and I  agreed on the procedure and site of the procedure.  The surgical site was marked.  Surgical consent had been signed. He was given IV antibiotics as preoperative antibiotic prophylaxis. He was transferred to the operating room and placed on the operating table in supine position with the Left upper extremity on an arm board.  General anesthesia was induced by the anesthesiologist.  Left upper extremity was prepped and draped in normal sterile orthopedic fashion.  A surgical pause was performed between the surgeons, anesthesia, and operating room staff and all were in agreement as to the patient, procedure, and site of procedure.  Tourniquet at the proximal aspect of the extremity was inflated to 250 mmHg after exsanguination of the arm with an Esmarch bandage.    The wounds were explored.  There was some gross contamination with fibers potentially from his glove.  These were removed with the pickups.  There is noted to be a partial laceration of the EDC tendon at the dorsum of the metacarpal head as well as a partial laceration of the radial edge of the central slip and the tendon fibers between the central slip and lateral band in the finger.  In the more proximal laceration in the hand the laceration did not go into the muscle.  The EDC tendons to the index finger were intact.  There was a laceration to a branch of the superficial branch of radial nerve.  The lacerations on the dorsum of the ring and small fingers were superficial.  The edges of these were debrided.  They did not require  suture.  The wounds were all debrided with the pickups.  Skin edges were debrided sharply with scissors and knife to remove damaged edges.  A 4-0 FiberWire suture was used to reapproximate the tendon edges of the long finger EDC tendon laceration in the dorsum of the hand.  This was done in a figure-of-eight fashion with the knot buried.  Good re-apposition of tendon ends was achieved.  A 4-0 Mersilene suture was used to  reapproximate the frayed edges of the edge of the central slip and the tissue between the central slip and lateral band in the finger.  The radial and ulnar digital nerve and vessel were found in the remaining portion of the index finger.  The artery was ligated with a 4-0 Vicryl suture in the end bipolar.  The nerves were placed under traction and bipolar and allowed to retract.  The metacarpal shaft was accessed through the more proximal wound.  This was used to transect the metacarpal.  The metacarpal was then carefully removed with the freer elevator and sharply with a knife.  An 8-0 nylon suture was used to reapproximate the branch of the superficial branch of radial nerve laceration through the more proximal wound.  This provided good re-apposition of the nerve ends.  The proximal wound was then closed with 4-0 nylon in a horizontal mattress fashion.  There was a small wound more distally on the long finger that was also closed with the 4-0 nylon suture.  This wound was approximately 1.5 cm in length. The remaining tissue from the index finger was then mobilized to cover the wound as best possible.  The still left an area on the dorsum of the long finger with no skin coverage.  The skin from the amputated index finger was then removed as a fillet flap.  All subcutaneous fatty tissue was removed from the undersurface of the skin.  This full-thickness skin graft was then placed on the dorsum of the long finger.  It was secured with 4-0 nylon suture with the ends left long to tie over bolster.  It was then cut to fit the defect.  The overall size of the graft was 6.5 cm x 3 cm.  A running 4-0 chromic suture was then used to approximate the edges of the graft to the remaining skin edges.  Good re-apposition was obtained.  The graft was pie crusted to allow any drainage.  The dogears portion of the wound more proximally was removed with a knife to provide a more normal contour.  This portion of the wound was then  closed with 4-0 nylon in a horizontal mattress fashion.  The wounds were all injected with quarter percent plain Marcaine to aid in postoperative analgesia.  A bolster of fluffed 4 x 4 gauze was placed over the skin graft and the long edges of the nylon suture tied over it.  The wounds were all dressed with sterile Xeroform 4 x 4's and wrapped with a Kerlix bandage.  A volar splint was placed and wrapped with Kerlix and Ace bandage.  The tourniquet was deflated at 138 minutes.  Fingertips were pink with brisk capillary refill after deflation of tourniquet.  The operative  drapes were broken down.  The patient was awoken from anesthesia safely.  He was transferred back to the stretcher and taken to PACU in stable condition.  I will see him back in the office in 1 week for postoperative followup.  I will give him a prescription for Norco 5/325  1-2 tabs PO q6 hours prn pain, dispense # 20 and Bactrim DS 1 p.o. twice daily x7 days.   [name], MD Electronically signed, 05/17/18  Addendum (05/21/18): clarify size of wound.

## 2018-05-17 NOTE — Anesthesia Postprocedure Evaluation (Signed)
Anesthesia Post Note  Patient: Zachary Gillespie  Procedure(s) Performed: Skin Graft Full Thickness (Left Finger) Ray Resection Left Index Finger (Left Finger) Extensor Tendon Repair (Left Finger) Cutaneous Nerve Repair (Left Hand)     Patient location during evaluation: PACU Anesthesia Type: General Level of consciousness: awake and alert Pain management: pain level controlled Vital Signs Assessment: post-procedure vital signs reviewed and stable Respiratory status: spontaneous breathing, nonlabored ventilation, respiratory function stable and patient connected to nasal cannula oxygen Cardiovascular status: blood pressure returned to baseline and stable Postop Assessment: no apparent nausea or vomiting Anesthetic complications: no    Last Vitals:  Vitals:   05/17/18 0101 05/17/18 0107  BP: 116/78 127/73  Pulse: 78 77  Resp: 17 15  Temp:    SpO2: 94% 94%    Last Pain:  Vitals:   05/17/18 0031  TempSrc:   PainSc: 0-No pain                 Jamiracle Avants,W. EDMOND

## 2018-05-17 NOTE — Discharge Instructions (Addendum)

## 2018-05-17 NOTE — Transfer of Care (Signed)
Immediate Anesthesia Transfer of Care Note  Patient: Zachary Gillespie  Procedure(s) Performed: Skin Graft Full Thickness (Left Finger) Ray Resection Left Index Finger (Left Finger) Extensor Tendon Repair (Left Finger) Cutaneous Nerve Repair (Left Hand)  Patient Location: PACU  Anesthesia Type:General  Level of Consciousness: drowsy  Airway & Oxygen Therapy: Patient Spontanous Breathing and Patient connected to face mask oxygen  Post-op Assessment: Report given to RN and Post -op Vital signs reviewed and stable  Post vital signs: Reviewed and stable  Last Vitals:  Vitals Value Taken Time  BP 146/80 05/17/2018 12:31 AM  Temp    Pulse 90 05/17/2018 12:32 AM  Resp 19 05/17/2018 12:32 AM  SpO2 95 % 05/17/2018 12:32 AM  Vitals shown include unvalidated device data.  Last Pain:  Vitals:   05/16/18 1849  TempSrc:   PainSc: 1          Complications: No apparent anesthesia complications

## 2018-05-18 ENCOUNTER — Other Ambulatory Visit: Payer: Self-pay | Admitting: Family Medicine

## 2018-05-18 DIAGNOSIS — R0681 Apnea, not elsewhere classified: Secondary | ICD-10-CM

## 2018-05-21 ENCOUNTER — Other Ambulatory Visit: Payer: Self-pay | Admitting: *Deleted

## 2018-05-21 MED ORDER — INSULIN PEN NEEDLE 32G X 6 MM MISC: 5 refills | Status: DC

## 2018-06-07 ENCOUNTER — Encounter: Payer: Self-pay | Admitting: Family Medicine

## 2018-06-08 ENCOUNTER — Telehealth: Payer: Self-pay | Admitting: Neurology

## 2018-06-08 NOTE — Telephone Encounter (Signed)
Due to current COVID 19 pandemic, our office is severely reducing in office visits, in order to minimize the risk to our patients and healthcare providers.    Pt understands that although there may be some limitations with this type of visit, we will take all precautions to reduce any security or privacy concerns.  Pt understands that this will be treated like an in office visit and we will file with pt's insurance, and there may be a patient responsible charge related to this service.  Pt's email is [email or web address]. Pt will be using [employer] for their virtual visit. Pt understands that the nurse will be calling to go over pt's chart.

## 2018-06-09 ENCOUNTER — Encounter: Payer: Self-pay | Admitting: Neurology

## 2018-06-09 MED ORDER — CYCLOBENZAPRINE HCL 10 MG PO TABS: 10.0000 mg | ORAL_TABLET | Freq: Three times a day (TID) | ORAL | 0 refills | Status: DC | PRN

## 2018-06-09 NOTE — Telephone Encounter (Signed)

## 2018-06-09 NOTE — Telephone Encounter (Signed)
Pt contacted and med sent to pharm

## 2018-06-10 ENCOUNTER — Encounter: Payer: Self-pay | Admitting: Neurology

## 2018-06-10 ENCOUNTER — Other Ambulatory Visit: Payer: Self-pay

## 2018-06-10 ENCOUNTER — Ambulatory Visit (INDEPENDENT_AMBULATORY_CARE_PROVIDER_SITE_OTHER): Admitting: Neurology

## 2018-06-10 DIAGNOSIS — R7303 Prediabetes: Secondary | ICD-10-CM

## 2018-06-10 DIAGNOSIS — E6609 Other obesity due to excess calories: Secondary | ICD-10-CM | POA: Diagnosis not present

## 2018-06-10 DIAGNOSIS — Z9189 Other specified personal risk factors, not elsewhere classified: Secondary | ICD-10-CM

## 2018-06-10 DIAGNOSIS — K219 Gastro-esophageal reflux disease without esophagitis: Secondary | ICD-10-CM | POA: Diagnosis not present

## 2018-06-10 DIAGNOSIS — Z6834 Body mass index (BMI) 34.0-34.9, adult: Secondary | ICD-10-CM

## 2018-06-10 NOTE — Patient Instructions (Signed)

## 2018-06-10 NOTE — Progress Notes (Signed)
[place]   Provider:  Larey Seat, M D  Referring Provider: [name], MD Primary Care Physician:  [name], MD  Virtual Visit via Video Note  I connected with Zachary Gillespie on 06/10/18 at  2:30 PM EDT by a video enabled telemedicine application and verified that I am speaking with the correct person using two identifiers.  Location: Patient: at home Provider:at [facility]    I discussed the limitations of evaluation and management by telemedicine and the availability of in person appointments. The patient expressed understanding and agreed to proceed.  History of Present Illness: see below -06-10-2018 : Chief complaint according to patient : He has recently had two surgeries, each time anesthesias were noted to give him prolonged apnea and his wife noted louder snoring. He endorsed more EDS, too.  Zachary Gillespie is a 53 y.o. male veteran- snoring loudly in supine sleep, but not restless.He has seen combat, was diagnosed with PTSD, seen here for a repeat sleep evaluation and  possible sleep study.    06-10-2018 : Chief complaint according to patient : He has recently had two surgeries, each time anesthesias were noted to give him prolonged apnea and his wife noted louder snoring. He endorsed more EDS, too.  "My  wife sleeps in a different room because of my snoring"   Zachary Gillespie report that he does not wake up refreshed or restored from his nocturnal sleep. He yawns  as soon as he gets up in the morning and feels that he has not gotten enough sleep. His wife has witnessed him to snore and is actually bothered enough to have moved to a different room. In addition he has to keep physically active and mentally stimulated to not doze off in daytime. 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 His job is physically active.   Dr. Dennard Schaumann wrote that the patient was on a military exercise another soldier stated that his  snoring was so bad and recommended he needed to see a doctor. His wife has also witnessed gasping sounds in sleep at night. He has a history of degenerative disc disease lumbar spine confirmed by MRI in 2015 and as his weight has increased his back has gotten worse. He feels stiff when sitting or rising from bed. He has been treated with Celebrex for osteoarthritis, but found no relief.obeisty, HTN, dyslipidemia, GERD, nephrolithiasis ( 2009 ) , sciatia on the  left.  Sleep habits are as follows: He is taking 5 mg of melatonin at night, multivitamin in the morning, fish oil, Prilosec, Flonase as needed Cozaar, Lexapro daily Flexeril as needed and currently not used, Zyrtec for allergies. Zachary Gillespie usually goes to bed at 10 PM and adheres to this regimen on weekdays and weekends, usually he is asleep very promptly, he prefers to sleep on his side, and sleeps on only one pillow for head support. The bedroom is described as cool, quiet and dark. As mentioned above, his wife is often moving to another room. He does not have nocturia. On occasion he will wake up from a choking sensation likely related to apneic breathing. His wife wakes him up in the morning, he does not wake up spontaneously. This is around 6:30 AM. As reported above he feels not refreshed or restored in the way for more sleep soon after he rises.  Sleep medical history and family sleep history: Both parents were loud  snorers but were  never formally evaluated for apnea. Maternal grandmother also loud snorer. He has one living sister, smoker, DM and obese. He had a tonsillectomy in childhood, no history of traumatic brain injuries, neck injuries, ENT surgeries.  Social history: married, one daughter - 55 working at the [employer].  Non smoker, non ETOH- caffeine ; no longer drinking mountain dew, but  caffeinated sodas  iced tea at lunch, no coffee.  5 hour energy drinks in preparation for a long drive.   Review of Systems: Out of a  complete 14 system review, the patient complains of only the following symptoms, and all other reviewed systems are negative.  Snoring, fatigue, anxiety.    Zachary Gillespie endorsed the Epworth sleepiness score at 10 points fatigue severity at 45 points, he is treated with an antidepressant and would currently not consider himself clinically depressed  How likely are you to doze in the following situations: 0 = not likely, 1 = slight chance, 2 = moderate chance, 3 = high chance  Sitting and Reading? Watching Television? Sitting inactive in a public place (theater or meeting)? Lying down in the afternoon when circumstances permit? Sitting and talking to someone? Sitting quietly after lunch without alcohol? In a car, while stopped for a few minutes in traffic? As a passenger in a car for an hour without a break?  Total = 10 points.  Neck size 17.5#      Social History   Socioeconomic History   Marital status: Married    Spouse name: Not on file   Number of children: 1   Years of education: college   Highest education level: Not on file  Occupational History   Not on file  Social Needs   Financial resource strain: Not on file   Food insecurity:    Worry: Not on file    Inability: Not on file   Transportation needs:    Medical: Not on file    Non-medical: Not on file  Tobacco Use   Smoking status: Never Smoker   Smokeless tobacco: Never Used  Substance and Sexual Activity   Alcohol use: Yes    Comment: RARE   Drug use: No   Sexual activity: Yes  Lifestyle   Physical activity:    Days per week: Not on file    Minutes per session: Not on file   Stress: Not on file  Relationships   Social connections:    Talks on phone: Not on file    Gets together: Not on file    Attends religious service: Not on file    Active member of club or organization: Not on file    Attends meetings of clubs or organizations: Not on file    Relationship status: Not on file     Intimate partner violence:    Fear of current or ex partner: Not on file    Emotionally abused: Not on file    Physically abused: Not on file    Forced sexual activity: Not on file  Other Topics Concern   Not on file  Social History Narrative   Drinks about 1 cup of tea a day     Family History  Problem Relation Age of Onset   Heart disease Father    Diabetes Father    Hyperlipidemia Father    Diabetes Sister    Heart disease Paternal Grandfather    Bladder Cancer Neg Hx    Kidney cancer Neg Hx    Prostate cancer Neg Hx  Past Medical History:  Diagnosis Date   DDD (degenerative disc disease), lumbar    Dyslipidemia    GERD (gastroesophageal reflux disease)    History of hiatal hernia    History of kidney stones    H/O   HLD (hyperlipidemia)    Hypertension    Obesity    Prediabetes    Sciatica of left side    Tubular adenoma of colon    2019 (Dr. Benson Norway)    Past Surgical History:  Procedure Laterality Date   AMPUTATION Left 05/16/2018   Procedure: Ray Resection Left Index Finger;  Surgeon: [name], MD;  Location: [facility];  Service: Orthopedics;  Laterality: Left;   COLONOSCOPY     ESOPHAGOGASTRODUODENOSCOPY     EXTRACORPOREAL SHOCK WAVE LITHOTRIPSY Left 07/24/2017   Procedure: EXTRACORPOREAL SHOCK WAVE LITHOTRIPSY (ESWL);  Surgeon: [name], MD;  Location: [facility];  Service: Urology;  Laterality: Left;   EXTRACORPOREAL SHOCK WAVE LITHOTRIPSY Right 12/11/2017   Procedure: EXTRACORPOREAL SHOCK WAVE LITHOTRIPSY (ESWL);  Surgeon: [name], MD;  Location: [facility];  Service: Urology;  Laterality: Right;   EYE SURGERY     INGUINAL HERNIA REPAIR Right 08/08/2017   Procedure: LAPAROSCOPIC INGUINAL HERNIA;  Surgeon: [name], MD;  Location: [facility];  Service: General;  Laterality: Right;   NERVE REPAIR Left 05/16/2018   Procedure: Cutaneous Nerve Repair;  Surgeon: [name], MD;  Location: [facility];  Service:  Orthopedics;  Laterality: Left;   SKIN FULL THICKNESS GRAFT Left 05/16/2018   Procedure: Skin Graft Full Thickness;  Surgeon: [name], MD;  Location: [place];  Service: Orthopedics;  Laterality: Left;   TENDON REPAIR Left 05/16/2018   Procedure: Extensor Tendon Repair;  Surgeon: [name], MD;  Location: [place];  Service: Orthopedics;  Laterality: Left;   TONSILLECTOMY     VASECTOMY      Current Outpatient Medications  Medication Sig Dispense Refill   aspirin EC 81 MG tablet Take 81 mg by mouth at bedtime.     atorvastatin (LIPITOR) 40 MG tablet Take 1 tablet (40 mg total) by mouth daily. (Patient taking differently: Take 40 mg by mouth every evening. ) 90 tablet 3   celecoxib (CELEBREX) 200 MG capsule TAKE 1 CAPSULE DAILY (Patient taking differently: Take 200 mg by mouth daily. ) 90 capsule 4   cetirizine (ZYRTEC) 10 MG tablet TAKE 1 TABLET DAILY (Patient taking differently: Take 10 mg by mouth daily. "No Therapeutic Substitution") 90 tablet 4   cyclobenzaprine (FLEXERIL) 10 MG tablet Take 1 tablet (10 mg total) by mouth 3 (three) times daily as needed for muscle spasms. 30 tablet 0   doxycycline (VIBRAMYCIN) 50 MG capsule Take 2 capsules (100 mg total) by mouth 2 (two) times daily. 28 capsule 0   escitalopram (LEXAPRO) 10 MG tablet TAKE 1 TABLET DAILY 90 tablet 3   fluticasone (FLONASE) 50 MCG/ACT nasal spray USE 2 SPRAYS IN EACH NOSTRIL DAILY (Patient taking differently: Place 2 sprays into both nostrils daily as needed for allergies or rhinitis. ) 48 g 4   hydrochlorothiazide (HYDRODIURIL) 25 MG tablet TAKE 1 TABLET DAILY (Patient taking differently: Take 25 mg by mouth daily. ) 90 tablet 3   HYDROcodone-acetaminophen (NORCO) 5-325 MG tablet 1-2 tabs po q6 hours prn pain 20 tablet 0   Insulin Pen Needle (BD PEN NEEDLE MICRO U/F) 32G X 6 MM MISC USE AS DIRECTED TO INJECT SAXENDA SQ QD 100 each 5   Liraglutide -Weight Management (SAXENDA) 18 MG/3ML SOPN Inject  3 mg into the  skin daily. Increase by 0.6mg  q week until max dose of 3mg  is reached (Patient taking differently: Inject 1.8 mg into the skin daily. "Increase by 0.6 mg every week until max dose of 3 mg is reached") 5 pen 11   losartan (COZAAR) 100 MG tablet TAKE 1 TABLET DAILY (Patient taking differently: Take 100 mg by mouth at bedtime. ) 90 tablet 3   Melatonin 10 MG TABS Take 10 mg by mouth at bedtime.      Multiple Vitamins-Minerals (ONE-A-DAY MENS HEALTH FORMULA PO) Take 1 tablet by mouth daily.     Omega-3 Krill Oil 500 MG CAPS Take 500 mg by mouth daily.      omeprazole (PRILOSEC) 40 MG capsule TAKE 1 CAPSULE DAILY (Patient taking differently: Take 40 mg by mouth daily before breakfast. ) 90 capsule 4   pregabalin (LYRICA) 50 MG capsule Take 50 mg by mouth 2 (two) times daily.     No current facility-administered medications for this visit.     Allergies as of 06/10/2018 - Review Complete 06/09/2018  Allergen Reaction Noted   Penicillins Anaphylaxis and Other (See Comments) 04/21/2012   Sulfa antibiotics Swelling and Other (See Comments) 04/21/2012   Keflex [cephalexin] Other (See Comments) 04/21/2012    Vitals: There were no vitals taken for this visit. Last Weight:  Wt Readings from Last 1 Encounters:  05/16/18 278 lb (126.1 kg)   TDV:VOHYW is no height or weight on file to calculate BMI.     Last Height:   Ht Readings from Last 1 Encounters:  05/16/18 5\' 9"  (1.753 m)    Observation:  General: The patient is awake, alert and appears not in acute distress. The patient is well groomed. Head: Normocephalic, atraumatic. Neck is supple. Mallampati  3- low soft [palate, no tonsils. ,  neck circumference:17. 25 . Nasal airflow congestted .  Retrognathia is not seen. He has worn braces in childhood. Trunk: BMI is 39/kg/m2.     Neurologic exam : The patient is awake and alert, oriented to place and time.    Attention span & concentration ability appears normal.  Speech is fluent,   without  dysarthria, dysphonia or aphasia.  Mood and affect are appropriate.  Cranial nerves: Pupils are equal and briskly reactive to light.Facial motor strength is symmetric and tongue and uvula move midline. Shoulder shrug was symmetrical.   Assessment and Plan: the new developments warrant a new sleep test, I proposed a HST.  [employer] coverage.    Follow Up Instructions: I will order both HST and PSG SPLIT.  RV with Np in 3 month /     I discussed the assessment and treatment plan with the patient. The patient was provided an opportunity to ask questions and all were answered. The patient agreed with the plan and demonstrated an understanding of the instructions.   The patient was advised to call back or seek an in-person evaluation if the symptoms worsen or if the condition fails to improve as anticipated.  I provided 20  minutes of non-face-to-face time during this encounter.  Asencion Partridge Aasir Daigler MD  06/10/2018   CC: [name], Md [address] [place], [place] [address]        PS : sleep study from 2018 :PATIENTS NAME:  Arbor, Cohen DOB:      April 08, 1965      MR#:    [id number]     DATE OF RECORDING: 05/20/2016 REFERRING M.D.:  Jenna Luo,  MD Study Performed:   Baseline Polysomnogram HISTORY:  Mr. Wanninger reports that he does not wake up refreshed or restored from his nocturnal sleep. He yawns as soon as he gets up in the morning. His wife has witnessed him to snore and is actually bothered enough to have moved to a different room. In addition, he has hypersomnia - he has to keep physically active and mentally stimulated to not doze off in daytime. His wife and colleagues have also witnessed gasping sounds in sleep at night.   He has a history of degenerative disc disease lumbar spine confirmed by MRI in 2015 and as his weight has increased his back has gotten worse. He feels stiff when sitting or rising from bed. He has been treated with Celebrex for  osteoarthritis, but found no relief. Morbid Obesity, HTN, dyslipidemia, GERD, nephrolithiasis (2009), sciatica on the left.   The patient endorsed the Epworth Sleepiness Scale at 13/24 points.   The patients weight 262 pounds with a height of 69 (inches), resulting in a BMI of 38.9 kg/m2. The patients neck circumference measured 17.2 inches.  CURRENT MEDICATIONS: Celecoxib, Cetirizine, Escitalopram, Fluticasone, Losartan, Melatonin, Multi-Vitamin, Omega-3 and Omeprazole   PROCEDURE:  This is a multichannel digital polysomnogram utilizing the Somnostar 11.2 system.  Electrodes and sensors were applied and monitored per AASM Specifications.   EEG, EOG, Chin and Limb EMG, were sampled at 200 Hz.  ECG, Snore and Nasal Pressure, Thermal Airflow, Respiratory Effort, CPAP Flow and Pressure, Oximetry was sampled at 50 Hz. Digital video and audio were recorded.      BASELINE STUDY; Lights Out was at 22:05 and Lights On at 04:59.  Total recording time (TRT) was 414.5 minutes, with a total sleep time (TST) of 398 minutes.  The patients sleep latency was 8.0 minutes. REM latency was 63.0 minutes.  The sleep efficiency was 96.1 %.     SLEEP ARCHITECTURE: WASO (Wake after sleep onset) was 8.5 minutes.  There were 16.5 minutes in Stage N1, 272.5 minutes Stage N2, 19 minutes Stage N3 and 90 minutes in Stage REM.  The percentage of Stage N1 was 4.1%, Stage N2 was 68.5%, Stage N3 was 4.8% and Stage R (REM sleep) was 22.6%.   RESPIRATORY ANALYSIS:  There were a total of 9 respiratory events:  3 obstructive apneas, 0 central apneas and 0 mixed apneas with a total of 3 apneas and an apnea index (AI) of .5 /hour. There were 6 hypopneas with a hypopnea index of 0.9 /hour. The patient also had 0 respiratory event related arousals (RERAs).     The total APNEA/HYPOPNEA INDEX (AHI) was 1.4/hour and the total RESPIRATORY DISTURBANCE INDEX was 1.4 /hour.  9 events occurred in REM sleep and 0 events in NREM. The REM AHI was 6  /hour, versus a non-REM AHI of 0. The patient spent 111.5 minutes of total sleep time in the supine position and 287 minutes in non-supine. The supine AHI was 0.0 versus a non-supine AHI of 1.9.  OXYGEN SATURATION & C02:  The Wake baseline 02 saturation was 96%, with the lowest being 91%. Time spent below 89% saturation equaled 0 minutes.   PERIODIC LIMB MOVEMENTS:   The patient had a total of 6 Periodic Limb Movements.  The arousals were noted as: 47 were spontaneous, 3 were associated with PLMs, and 9 were associated with respiratory events.  Audio and video analysis did not show any abnormal or unusual movements, behaviors, phonations or vocalizations.  The patient did not take  bathroom breaks. Snoring was only noted in supine sleep. EKG was in keeping with normal sinus rhythm (NSR).  Post-study, the patient indicated that sleep was the same as usual.   IMPRESSION: There was no clinically significant degree of sleep apnea, PLMs or hypoxemia noted.  Sleep efficiency was high. Snoring ( only present in supine ) was moderate.    RECOMMENDATIONS:  1. Snoring treatment can be achieved by dental device, weight loss and avoiding supine sleep position. 2. A follow up appointment will be scheduled in the Sleep Clinic at [facility]. The referring provider will be notified of the results.

## 2018-06-16 ENCOUNTER — Other Ambulatory Visit

## 2018-06-16 ENCOUNTER — Other Ambulatory Visit: Payer: Self-pay

## 2018-06-16 DIAGNOSIS — E291 Testicular hypofunction: Secondary | ICD-10-CM

## 2018-06-16 DIAGNOSIS — I10 Essential (primary) hypertension: Secondary | ICD-10-CM

## 2018-06-16 DIAGNOSIS — E785 Hyperlipidemia, unspecified: Secondary | ICD-10-CM

## 2018-06-17 LAB — COMPLETE METABOLIC PANEL WITH GFR
AG Ratio: 1.7 (calc) (ref 1.0–2.5)
ALT: 21 U/L (ref 9–46)
AST: 17 U/L (ref 10–35)
Albumin: 4.1 g/dL (ref 3.6–5.1)
Alkaline phosphatase (APISO): 91 U/L (ref 35–144)
BUN: 10 mg/dL (ref 7–25)
CO2: 25 mmol/L (ref 20–32)
Calcium: 9.2 mg/dL (ref 8.6–10.3)
Chloride: 102 mmol/L (ref 98–110)
Creat: 0.83 mg/dL (ref 0.70–1.33)
GFR, Est African American: 118 mL/min/{1.73_m2} (ref >=60)
GFR, Est Non African American: 102 mL/min/{1.73_m2} (ref >=60)
Globulin: 2.4 g/dL (calc) (ref 1.9–3.7)
Glucose, Bld: 109 mg/dL — ABNORMAL HIGH (ref 65–99)
Potassium: 4.2 mmol/L (ref 3.5–5.3)
Sodium: 140 mmol/L (ref 135–146)
Total Bilirubin: 0.5 mg/dL (ref 0.2–1.2)
Total Protein: 6.5 g/dL (ref 6.1–8.1)

## 2018-06-17 LAB — CBC WITH DIFFERENTIAL/PLATELET
Absolute Monocytes: 377 cells/uL (ref 200–950)
Basophils Absolute: 59 cells/uL (ref 0–200)
Basophils Relative: 0.8 %
Eosinophils Absolute: 429 cells/uL (ref 15–500)
Eosinophils Relative: 5.8 %
HCT: 38.9 % (ref 38.5–50.0)
Hemoglobin: 13.1 g/dL — ABNORMAL LOW (ref 13.2–17.1)
Lymphs Abs: 1739 cells/uL (ref 850–3900)
MCH: 28.1 pg (ref 27.0–33.0)
MCHC: 33.7 g/dL (ref 32.0–36.0)
MCV: 83.3 fL (ref 80.0–100.0)
MPV: 11.3 fL (ref 7.5–12.5)
Monocytes Relative: 5.1 %
Neutro Abs: 4795 cells/uL (ref 1500–7800)
Neutrophils Relative %: 64.8 %
Platelets: 314 10*3/uL (ref 140–400)
RBC: 4.67 10*6/uL (ref 4.20–5.80)
RDW: 14.1 % (ref 11.0–15.0)
Total Lymphocyte: 23.5 %
WBC: 7.4 10*3/uL (ref 3.8–10.8)

## 2018-06-17 LAB — LIPID PANEL
Cholesterol: 106 mg/dL (ref <200)
HDL: 31 mg/dL — ABNORMAL LOW (ref >=40)
LDL Cholesterol (Calc): 52 mg/dL (calc)
Non-HDL Cholesterol (Calc): 75 mg/dL (calc) (ref <130)
Total CHOL/HDL Ratio: 3.4 (calc) (ref <5.0)
Triglycerides: 144 mg/dL (ref <150)

## 2018-06-18 ENCOUNTER — Encounter: Payer: Self-pay | Admitting: Family Medicine

## 2018-06-18 ENCOUNTER — Ambulatory Visit (INDEPENDENT_AMBULATORY_CARE_PROVIDER_SITE_OTHER): Admitting: Family Medicine

## 2018-06-18 ENCOUNTER — Other Ambulatory Visit: Payer: Self-pay

## 2018-06-18 VITALS — BP 110/74 | HR 105 | Temp 99.1°F | Resp 16 | Ht 69.0 in | Wt 275.0 lb

## 2018-06-18 DIAGNOSIS — Z0001 Encounter for general adult medical examination with abnormal findings: Secondary | ICD-10-CM | POA: Diagnosis not present

## 2018-06-18 DIAGNOSIS — R7303 Prediabetes: Secondary | ICD-10-CM

## 2018-06-18 DIAGNOSIS — E7800 Pure hypercholesterolemia, unspecified: Secondary | ICD-10-CM

## 2018-06-18 DIAGNOSIS — M5136 Other intervertebral disc degeneration, lumbar region: Secondary | ICD-10-CM

## 2018-06-18 DIAGNOSIS — I10 Essential (primary) hypertension: Secondary | ICD-10-CM

## 2018-06-18 DIAGNOSIS — Z0000 Encounter for general adult medical examination without abnormal findings: Secondary | ICD-10-CM

## 2018-06-18 NOTE — Progress Notes (Signed)
Subjective:     Patient ID: Zachary Gillespie, male   DOB: 10/26/1965, 53 y.o.   MRN: [id number]  HPI Patient presents today for complete physical exam.  Has had an extremely difficult year from medical standpoint.  He had a colonoscopy last year that was significant for 1 polyp however this was a benign polyp.  He had kidney stones in December that required lithotripsy and hospitalization.  Earlier this year he suffered a traumatic injury to his left hand with a table saw.  His index finger was completely removed at the MCP joint.  He also suffered a severe laceration requiring a skin graft on the dorsum of his third MCP joint up to the PIP joint.  He is currently seeing a hand specialist, Dr. Fredna Dow who is caring for his hand injury.  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  He is not requiring any other hydrocodone.  His most recent lab work is listed below: Appointment on 06/16/2018  Component Date Value Ref Range Status  . WBC 06/16/2018 7.4  3.8 - 10.8 Thousand/uL Final  . RBC 06/16/2018 4.67  4.20 - 5.80 Million/uL Final  . Hemoglobin 06/16/2018 13.1* 13.2 - 17.1 g/dL Final  . HCT 06/16/2018 38.9  38.5 - 50.0 % Final  . MCV 06/16/2018 83.3  80.0 - 100.0 fL Final  . MCH 06/16/2018 28.1  27.0 - 33.0 pg Final  . MCHC 06/16/2018 33.7  32.0 - 36.0 g/dL Final  . RDW 06/16/2018 14.1  11.0 - 15.0 % Final  . Platelets 06/16/2018 314  140 - 400 Thousand/uL Final  . MPV 06/16/2018 11.3  7.5 - 12.5 fL Final  . Neutro Abs 06/16/2018 4,795  1,500 - 7,800 cells/uL Final  . Lymphs Abs 06/16/2018 1,739  850 - 3,900 cells/uL Final  . Absolute Monocytes 06/16/2018 377  200 - 950 cells/uL Final  . Eosinophils Absolute 06/16/2018 429  15 - 500 cells/uL Final  . Basophils Absolute 06/16/2018 59  0 - 200 cells/uL Final  . Neutrophils Relative % 06/16/2018 64.8  % Final  . Total Lymphocyte 06/16/2018 23.5  % Final  . Monocytes Relative 06/16/2018 5.1  % Final  .  Eosinophils Relative 06/16/2018 5.8  % Final  . Basophils Relative 06/16/2018 0.8  % Final  . Glucose, Bld 06/16/2018 109* 65 - 99 mg/dL Final   Comment: .            Fasting reference interval . For someone without known diabetes, a glucose value between 100 and 125 mg/dL is consistent with prediabetes and should be confirmed with a follow-up test. .   . BUN 06/16/2018 10  7 - 25 mg/dL Final  . Creat 06/16/2018 0.83  0.70 - 1.33 mg/dL Final   Comment: For patients >28 years of age, the reference limit for Creatinine is approximately 13% higher for people identified as African-American. .   . GFR, Est Non African American 06/16/2018 102  > OR = 60 mL/min/1.52m2 Final  . GFR, Est African American 06/16/2018 118  > OR = 60 mL/min/1.19m2 Final  . BUN/Creatinine Ratio 49/44/9675 NOT APPLICABLE  6 - 22 (calc) Final  . Sodium 06/16/2018 140  135 - 146 mmol/L Final  . Potassium 06/16/2018 4.2  3.5 - 5.3 mmol/L Final  . Chloride 06/16/2018 102  98 - 110 mmol/L Final  . CO2 06/16/2018 25  20 - 32 mmol/L Final  . Calcium 06/16/2018 9.2  8.6 -  10.3 mg/dL Final  . Total Protein 06/16/2018 6.5  6.1 - 8.1 g/dL Final  . Albumin 06/16/2018 4.1  3.6 - 5.1 g/dL Final  . Globulin 06/16/2018 2.4  1.9 - 3.7 g/dL (calc) Final  . AG Ratio 06/16/2018 1.7  1.0 - 2.5 (calc) Final  . Total Bilirubin 06/16/2018 0.5  0.2 - 1.2 mg/dL Final  . Alkaline phosphatase (APISO) 06/16/2018 91  35 - 144 U/L Final  . AST 06/16/2018 17  10 - 35 U/L Final  . ALT 06/16/2018 21  9 - 46 U/L Final  . Cholesterol 06/16/2018 106  <200 mg/dL Final  . HDL 06/16/2018 31* > OR = 40 mg/dL Final  . Triglycerides 06/16/2018 144  <150 mg/dL Final  . LDL Cholesterol (Calc) 06/16/2018 52  mg/dL (calc) Final   Comment: Reference range: <100 . Desirable range <100 mg/dL for primary prevention;   <70 mg/dL for patients with CHD or diabetic patients  with > or = 2 CHD risk factors. Marland Kitchen LDL-C is now calculated using the Martin-Hopkins   calculation, which is a validated novel method providing  better accuracy than the Friedewald equation in the  estimation of LDL-C.  Cresenciano Genre et al. Annamaria Helling. 9518;841(66): 2061-2068  (http://education.[facility].com/faq/FAQ164)   . Total CHOL/HDL Ratio 06/16/2018 3.4  <5.0 (calc) Final  . Non-HDL Cholesterol (Calc) 06/16/2018 75  <130 mg/dL (calc) Final   Comment: For patients with diabetes plus 1 major ASCVD risk  factor, treating to a non-HDL-C goal of <100 mg/dL  (LDL-C of <70 mg/dL) is considered a therapeutic  option.    Labs are significant for an elevated blood sugar of 109 as well as a low HDL of 31.  Hemoglobin was low however the patient's hemoglobin was as low as eleven 1 month ago so this is a significant improvement and likely reflects recovery from his blood loss Past Medical History:  Diagnosis Date  . DDD (degenerative disc disease), lumbar   . Dyslipidemia   . GERD (gastroesophageal reflux disease)   . History of hiatal hernia   . History of kidney stones    H/O  . HLD (hyperlipidemia)   . Hypertension   . Obesity   . Prediabetes   . Sciatica of left side   . Tubular adenoma of colon    2019 (Dr. Benson Norway)   Past Surgical History:  Procedure Laterality Date  . 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 COLONOSCOPY    . ESOPHAGOGASTRODUODENOSCOPY    . EXTRACORPOREAL SHOCK WAVE LITHOTRIPSY Left 07/24/2017   Procedure: EXTRACORPOREAL SHOCK WAVE LITHOTRIPSY (ESWL);  Surgeon: [name], MD;  Location: [facility];  Service: Urology;  Laterality: Left;  . EXTRACORPOREAL SHOCK WAVE LITHOTRIPSY Right 12/11/2017   Procedure: EXTRACORPOREAL SHOCK WAVE LITHOTRIPSY (ESWL);  Surgeon: [name], MD;  Location: [facility];  Service: Urology;  Laterality: Right;  . EYE SURGERY    . INGUINAL HERNIA REPAIR Right 08/08/2017   Procedure: LAPAROSCOPIC  INGUINAL HERNIA;  Surgeon: [name], MD;  Location: [facility];  Service: General;  Laterality: Right;  . NERVE REPAIR Left 05/16/2018   Procedure: Cutaneous Nerve Repair;  Surgeon: [name], MD;  Location: [place];  Service: Orthopedics;  Laterality: Left;  . SKIN FULL THICKNESS GRAFT Left 05/16/2018   Procedure: Skin Graft Full Thickness;  Surgeon: [name], MD;  Location: [place];  Service: Orthopedics;  Laterality: Left;  .  TENDON REPAIR Left 05/16/2018   Procedure: Extensor Tendon Repair;  Surgeon: [name], MD;  Location: [place];  Service: Orthopedics;  Laterality: Left;  . TONSILLECTOMY    . VASECTOMY     Current Outpatient Medications on File Prior to Visit  Medication Sig Dispense Refill  . aspirin EC 81 MG tablet Take 81 mg by mouth at bedtime.    Marland Kitchen atorvastatin (LIPITOR) 40 MG tablet Take 1 tablet (40 mg total) by mouth daily. 90 tablet 3  . celecoxib (CELEBREX) 200 MG capsule TAKE 1 CAPSULE DAILY 90 capsule 4  . cetirizine (ZYRTEC) 10 MG tablet TAKE 1 TABLET DAILY (Patient taking differently: Take 10 mg by mouth daily. "No Therapeutic Substitution") 90 tablet 4  . cyclobenzaprine (FLEXERIL) 10 MG tablet Take 1 tablet (10 mg total) by mouth 3 (three) times daily as needed for muscle spasms. 30 tablet 0  . doxycycline (VIBRAMYCIN) 50 MG capsule Take 2 capsules (100 mg total) by mouth 2 (two) times daily. 28 capsule 0  . escitalopram (LEXAPRO) 10 MG tablet TAKE 1 TABLET DAILY 90 tablet 3  . fluticasone (FLONASE) 50 MCG/ACT nasal spray USE 2 SPRAYS IN EACH NOSTRIL DAILY (Patient taking differently: Place 2 sprays into both nostrils daily as needed for allergies or rhinitis. ) 48 g 4  . hydrochlorothiazide (HYDRODIURIL) 25 MG tablet TAKE 1 TABLET DAILY (Patient taking differently: Take 25 mg by mouth daily. ) 90 tablet 3  . HYDROcodone-acetaminophen (NORCO) 5-325 MG tablet 1-2 tabs po q6 hours prn pain 20 tablet 0  . Insulin Pen Needle (BD PEN NEEDLE MICRO U/F) 32G X 6 MM MISC  USE AS DIRECTED TO INJECT SAXENDA SQ QD 100 each 5  . Liraglutide -Weight Management (SAXENDA) 18 MG/3ML SOPN Inject 3 mg into the skin daily. Increase by 0.6mg  q week until max dose of 3mg  is reached (Patient taking differently: Inject 1.8 mg into the skin daily. "Increase by 0.6 mg every week until max dose of 3 mg is reached") 5 pen 11  . losartan (COZAAR) 100 MG tablet TAKE 1 TABLET DAILY (Patient taking differently: Take 100 mg by mouth at bedtime. ) 90 tablet 3  . Melatonin 10 MG TABS Take 10 mg by mouth at bedtime.     . Multiple Vitamins-Minerals (ONE-A-DAY MENS HEALTH FORMULA PO) Take 1 tablet by mouth daily.    . Omega-3 Krill Oil 500 MG CAPS Take 500 mg by mouth daily.     Marland Kitchen omeprazole (PRILOSEC) 40 MG capsule TAKE 1 CAPSULE DAILY (Patient taking differently: Take 40 mg by mouth daily before breakfast. ) 90 capsule 4  . pregabalin (LYRICA) 50 MG capsule Take 50 mg by mouth 2 (two) times daily.     No current facility-administered medications on file prior to visit.    Allergies  Allergen Reactions  . Penicillins Anaphylaxis and Other (See Comments)    Has patient had a PCN reaction causing immediate rash, facial/tongue/throat swelling, SOB or lightheadedness with hypotension: Yes Has patient had a PCN reaction causing severe rash involving mucus membranes or skin necrosis: No Has patient had a PCN reaction that required hospitalization: Yes Has patient had a PCN reaction occurring within the last 10 years: No If all of the above answers are "NO", then may proceed with Cephalosporin use.   . Sulfa Antibiotics Swelling and Other (See Comments)    Childhood allergy- site of swelling not recalled  . Keflex [Cephalexin] Other (See Comments)    Childhood allergy- reaction not recalled  Social History   Socioeconomic History  . Marital status: Married    Spouse name: Not on file  . Number of children: 1  . Years of education: college  . Highest education level: Not on file   Occupational History  . Not on file  Social Needs  . Financial resource strain: Not on file  . Food insecurity    Worry: Not on file    Inability: Not on file  . Transportation needs    Medical: Not on file    Non-medical: Not on file  Tobacco Use  . Smoking status: Never Smoker  . Smokeless tobacco: Never Used  Substance and Sexual Activity  . Alcohol use: Yes    Comment: RARE  . Drug use: No  . Sexual activity: Yes  Lifestyle  . Physical activity    Days per week: Not on file    Minutes per session: Not on file  . Stress: Not on file  Relationships  . Social Herbalist on phone: Not on file    Gets together: Not on file    Attends religious service: Not on file    Active member of club or organization: Not on file    Attends meetings of clubs or organizations: Not on file    Relationship status: Not on file  . Intimate partner violence    Fear of current or ex partner: Not on file    Emotionally abused: Not on file    Physically abused: Not on file    Forced sexual activity: Not on file  Other Topics Concern  . Not on file  Social History Narrative   Drinks about 1 cup of tea a day      Review of Systems  All other systems reviewed and are negative.      Objective:   Physical Exam Vitals signs reviewed.  Constitutional:      Appearance: He is well-developed.  Cardiovascular:     Rate and Rhythm: Normal rate and regular rhythm.     Heart sounds: Normal heart sounds.  Pulmonary:     Effort: Pulmonary effort is normal. No respiratory distress.     Breath sounds: Normal breath sounds. No stridor. No wheezing or rales.  Abdominal:     General: Bowel sounds are normal. There is no distension.     Palpations: Abdomen is soft. There is no mass.     Tenderness: There is no abdominal tenderness. There is no guarding or rebound.   Patient is wearing a cast on his left hand.  There is a visible absence of his second digit at the MCP joint.      Assessment:    1. Prediabetes  2. DDD (degenerative disc disease), lumbar  3. Class 2 severe obesity with serious comorbidity in adult, unspecified BMI, unspecified obesity type ([place])  4. Pure hypercholesterolemia  5. Benign essential HTN  6. General medical exam      Plan:    Cancer screening is up-to-date.  Colonoscopy last year was significant for 1 tubular adenoma.  PSA last year was outstanding.  This was not drawn this year but we will get again next year at his physical.  His last PSA was 0.8 in May 2019.  Blood pressure today is outstanding.  I am concerned about his prediabetes on his lab work as well as his dyslipidemia.  I encouraged 20 to 30 pounds weight loss and increasing aerobic exercise.  He does have restrictions regarding physical  activity due to his degenerative disc disease in the lumbar spine.  Blood pressure today is well controlled and LDL cholesterol is outstanding. I am very proud of the patient losing 17 pounds.  I encouraged him to keep it up.  His blood pressure today is excellent 110/74.  I will check hemoglobin A1c today.  I suspect that his hemoglobin A1c will have improved with 17 pounds weight loss.  I explained to the patient that usually people will achieve approximately 10% body weight loss on Saxenda.  Therefore I would expect 20 to 30 pounds total.  I believe he is a little more than half way there.  He will continue the medication at the present time.  Reassess in 6 months.

## 2018-07-17 ENCOUNTER — Ambulatory Visit: Admitting: Urology

## 2018-07-31 ENCOUNTER — Encounter: Payer: Self-pay | Admitting: Family Medicine

## 2018-07-31 DIAGNOSIS — Z139 Encounter for screening, unspecified: Secondary | ICD-10-CM

## 2018-08-02 ENCOUNTER — Encounter

## 2018-08-03 ENCOUNTER — Other Ambulatory Visit: Payer: Self-pay

## 2018-08-03 DIAGNOSIS — Z20822 Contact with and (suspected) exposure to covid-19: Secondary | ICD-10-CM

## 2018-08-04 LAB — NOVEL CORONAVIRUS, NAA: SARS-CoV-2, NAA: NOT DETECTED

## 2018-08-14 ENCOUNTER — Encounter: Payer: Self-pay | Admitting: Family Medicine

## 2018-08-17 MED ORDER — PREGABALIN 50 MG PO CAPS: 50.0000 mg | ORAL_CAPSULE | Freq: Two times a day (BID) | ORAL | 0 refills | Status: DC

## 2018-08-17 NOTE — Telephone Encounter (Signed)
Ok to refill 

## 2018-08-21 ENCOUNTER — Ambulatory Visit (INDEPENDENT_AMBULATORY_CARE_PROVIDER_SITE_OTHER): Admitting: Neurology

## 2018-08-21 ENCOUNTER — Other Ambulatory Visit: Payer: Self-pay

## 2018-08-21 DIAGNOSIS — E6609 Other obesity due to excess calories: Secondary | ICD-10-CM

## 2018-08-21 DIAGNOSIS — Z9189 Other specified personal risk factors, not elsewhere classified: Secondary | ICD-10-CM

## 2018-08-21 DIAGNOSIS — R7303 Prediabetes: Secondary | ICD-10-CM

## 2018-08-21 DIAGNOSIS — G4733 Obstructive sleep apnea (adult) (pediatric): Secondary | ICD-10-CM | POA: Diagnosis not present

## 2018-08-21 DIAGNOSIS — F4310 Post-traumatic stress disorder, unspecified: Secondary | ICD-10-CM

## 2018-08-21 DIAGNOSIS — K219 Gastro-esophageal reflux disease without esophagitis: Secondary | ICD-10-CM

## 2018-08-25 ENCOUNTER — Other Ambulatory Visit: Payer: Self-pay | Admitting: Family Medicine

## 2018-08-25 DIAGNOSIS — I10 Essential (primary) hypertension: Secondary | ICD-10-CM

## 2018-08-27 ENCOUNTER — Encounter: Payer: Self-pay | Admitting: Family Medicine

## 2018-08-27 ENCOUNTER — Telehealth: Payer: Self-pay | Admitting: Neurology

## 2018-08-27 DIAGNOSIS — G4730 Sleep apnea, unspecified: Secondary | ICD-10-CM | POA: Insufficient documentation

## 2018-08-27 NOTE — Procedures (Signed)
PATIENT'S NAME:  Zachary Gillespie, Zachary Gillespie DOB:      02/28/1965      Zachary#:    [id number]     DATE OF RECORDING: 08/21/2018 REFERRING M.D.:  Jenna Luo, MD Study Performed:  Expanded EEG Polysomnogram HISTORY:   Zachary Gillespie is a 53 y.o. male veteran- He has seen combat, was diagnosed with PTSD, seen here for a repeat sleep evaluation and possible sleep study.  "My wife sleeps in a different room because of my snoring" Zachary Gillespie was seen on 06-10-2018 by video consult, referred by Dr. Dennard Schaumann.  He has recently had two surgeries, each time the anesthesia caused him prolonged apnea and his wife noted louder snoring. He endorsed more sleepiness as well.   DDD, PTSD, dyslipidemia, GERD, hypertension, kidney stones, obesity and sciatica of left side The patient endorsed the Epworth Sleepiness Scale at 10 points.   The patient's weight 278 pounds with a height of 69 (inches), resulting in a BMI of 41.1 kg/m2. The patient's neck circumference measured 17.5 inches.  CURRENT MEDICATIONS: Celecoxib, Cetirizine, Escitalopram, Fluticasone, Losartan, Melatonin, Multi-Vitamin, Omega-3 and Omeprazole   PROCEDURE:  This is a multichannel digital polysomnogram utilizing the Somnostar 11.2 system.  Electrodes and sensors were applied and monitored per AASM Specifications.   EEG, EOG, Chin and Limb EMG, were sampled at 200 Hz.  ECG, Snore and Nasal Pressure, Thermal Airflow, Respiratory Effort, CPAP Flow and Pressure, Oximetry was sampled at 50 Hz. Digital video and audio were recorded.      BASELINE STUDY: Lights Out was at 21:35 and Lights On at 05:41.  Total recording time (TRT) was 487 minutes, with a total sleep time (TST) of 431.5 minutes.  The patient's sleep latency was 31 minutes.  REM latency was 421.5 minutes.  The sleep efficiency was 88.6 %.     SLEEP ARCHITECTURE: WASO (Wake after sleep onset) was 35.5 minutes.  There were 52 minutes in Stage N1, 302.5 minutes Stage N2, 33 minutes Stage N3 and 44 minutes  in Stage REM.  The percentage of Stage N1 was 12.1%, Stage N2 was 70.1%, Stage N3 was 7.6% and Stage R (REM sleep) was 10.2%.   RESPIRATORY ANALYSIS:  There were a total of 245 respiratory events:  9 obstructive apneas, and 236 hypopneas with some respiratory event related arousals (RERAs). Loud gasping and snoring was noted      The total APNEA/HYPOPNEA INDEX (AHI) was 34.1 /hour.  17 events occurred in REM sleep and 438 events in NREM. The REM AHI was 23.2 /hour, versus a non-REM AHI of 35.3. The patient spent 287.5 minutes of total sleep time in the supine position and 144 minutes in non-supine. The supine AHI was 47.0 versus a non-supine AHI of 8.3.  OXYGEN SATURATION & C02:  The Wake baseline 02 saturation was 92%, with the lowest being 84%. Time spent below 89% saturation equaled 26 minutes.  PERIODIC LIMB MOVEMENTS:  The patient had a total of 0 Periodic Limb Movements.  The arousals were noted as: 40 were spontaneous, 0 were associated with PLMs, and 68 were associated with respiratory events.  Audio and video analysis did document loud snoring, but did not show any abnormal or unusual movements, behaviors, phonations or vocalizations.  REM sleep was seen only in the last hour of sleep.  The patient took one bathroom break. EKG was in keeping with normal sinus rhythm (NSR), very rare, isolated PVCs. See screen shot  Post-study, the patient indicated that sleep was as usual.  IMPRESSION:  1. Obstructive Sleep Apnea (OSA) without REM sleep accentuation and with strong supine positional dependence. 2. Snoring was loud and gasping for air was noted. 3. The patient has intermittent hypoxia, but not sustained.     RECOMMENDATIONS:  1. Advise full-night, attended, CPAP titration study to optimize therapy.   2. Weight loss can certainly reduce snoring and apnea. 3. Avoiding supine sleep can reduce snoring and apnea, too.    I certify that I have reviewed the entire raw data  recording prior to the issuance of this report in accordance with the Standards of Accreditation of the American Academy of Sleep Medicine (AASM)   Larey Seat, MD  09-27-2018 Diplomat, American Board of Psychiatry and Neurology  Diplomat, American Board of [place] Director, [employer] [facility] [employer]

## 2018-08-27 NOTE — Telephone Encounter (Signed)
I called pt. I advised pt that Dr. Dohmeier reviewed their sleep study results and found that sleep apnea and recommends that pt be treated with a cpap. Dr. Dohmeier recommends that pt return for a repeat sleep study in order to properly titrate the cpap and ensure a good mask fit. Pt is agreeable to returning for a titration study. I advised pt that our sleep lab will file with pt's insurance and call pt to schedule the sleep study when we hear back from the pt's insurance regarding coverage of this sleep study. Pt verbalized understanding of results. Pt had no questions at this time but was encouraged to call back if questions arise.   

## 2018-08-27 NOTE — Telephone Encounter (Signed)
-----   Message from Larey Seat, MD sent at 08/27/2018 12:35 PM EDT ----- IMPRESSION:   1. Obstructive Sleep Apnea (OSA) without REM sleep accentuation. AHI was 34.1/h- severe apnea. and with strong supine positional dependence.  2. Snoring was loud and gasping for air was noted.  3. The patient has intermittent hypoxia, but not sustained.     RECOMMENDATIONS:   1. Advise full-night, attended, CPAP titration study to optimize  therapy.   2. Weight loss can certainly reduce snoring and apnea.  3. Avoiding supine sleep can reduce snoring and apnea, too.

## 2018-08-27 NOTE — Addendum Note (Signed)
Addended by: Larey Seat on: 08/27/2018 12:35 PM   Modules accepted: Orders

## 2018-09-01 ENCOUNTER — Other Ambulatory Visit: Payer: Self-pay | Admitting: Family Medicine

## 2018-09-01 ENCOUNTER — Encounter: Payer: Self-pay | Admitting: Family Medicine

## 2018-09-01 MED ORDER — CYCLOBENZAPRINE HCL 10 MG PO TABS: 10.0000 mg | ORAL_TABLET | Freq: Three times a day (TID) | ORAL | 0 refills | Status: DC | PRN

## 2018-09-01 NOTE — Telephone Encounter (Signed)
OK to refill or do you need to see him?

## 2018-09-25 ENCOUNTER — Other Ambulatory Visit: Payer: Self-pay | Admitting: Family Medicine

## 2018-09-30 ENCOUNTER — Telehealth: Payer: Self-pay | Admitting: Family Medicine

## 2018-09-30 ENCOUNTER — Other Ambulatory Visit (HOSPITAL_COMMUNITY)
Admission: RE | Admit: 2018-09-30 | Discharge: 2018-09-30 | Disposition: A | Discharge status: Home / Self Care | Source: Ambulatory Visit | Attending: Neurology | Admitting: Neurology

## 2018-09-30 DIAGNOSIS — Z20828 Contact with and (suspected) exposure to other viral communicable diseases: Secondary | ICD-10-CM | POA: Insufficient documentation

## 2018-09-30 DIAGNOSIS — Z01812 Encounter for preprocedural laboratory examination: Secondary | ICD-10-CM | POA: Insufficient documentation

## 2018-09-30 LAB — SARS CORONAVIRUS 2 (TAT 6-24 HRS): SARS Coronavirus 2: NEGATIVE

## 2018-09-30 NOTE — Telephone Encounter (Signed)
Patient called in requesting a [employer] referral for Plastic Surgery. 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 [employer] referral submitted on [employer] pending response.

## 2018-10-02 ENCOUNTER — Other Ambulatory Visit: Payer: Self-pay

## 2018-10-02 ENCOUNTER — Ambulatory Visit (INDEPENDENT_AMBULATORY_CARE_PROVIDER_SITE_OTHER): Admitting: Neurology

## 2018-10-02 ENCOUNTER — Institutional Professional Consult (permissible substitution): Admitting: Plastic Surgery

## 2018-10-02 DIAGNOSIS — G4733 Obstructive sleep apnea (adult) (pediatric): Secondary | ICD-10-CM | POA: Diagnosis not present

## 2018-10-02 DIAGNOSIS — F4310 Post-traumatic stress disorder, unspecified: Secondary | ICD-10-CM

## 2018-10-02 DIAGNOSIS — R7303 Prediabetes: Secondary | ICD-10-CM

## 2018-10-02 DIAGNOSIS — E6609 Other obesity due to excess calories: Secondary | ICD-10-CM

## 2018-10-07 ENCOUNTER — Encounter: Payer: Self-pay | Admitting: Plastic Surgery

## 2018-10-07 ENCOUNTER — Ambulatory Visit (INDEPENDENT_AMBULATORY_CARE_PROVIDER_SITE_OTHER): Admitting: Plastic Surgery

## 2018-10-07 ENCOUNTER — Ambulatory Visit (HOSPITAL_COMMUNITY)
Admission: RE | Admit: 2018-10-07 | Discharge: 2018-10-07 | Disposition: A | Discharge status: Home / Self Care | Source: Ambulatory Visit | Attending: Plastic Surgery | Admitting: Plastic Surgery

## 2018-10-07 ENCOUNTER — Other Ambulatory Visit: Payer: Self-pay

## 2018-10-07 VITALS — BP 144/88 | HR 81 | Temp 97.5°F | Ht 69.0 in | Wt 290.8 lb

## 2018-10-07 DIAGNOSIS — M25642 Stiffness of left hand, not elsewhere classified: Secondary | ICD-10-CM

## 2018-10-07 DIAGNOSIS — M24542 Contracture, left hand: Secondary | ICD-10-CM

## 2018-10-07 DIAGNOSIS — L905 Scar conditions and fibrosis of skin: Secondary | ICD-10-CM | POA: Diagnosis not present

## 2018-10-07 NOTE — Assessment & Plan Note (Signed)
Pt overall has a good result given the extent of his injury.  PIP and MCP stiffness are contributing to flexion limitation for long finger as demonstrated by passive limitations.  The firm nodule could be scar tissue or possibly HO so will get xray to evaluate.  Will do steroid injection today in and around pip and mcp to see if that helps loosen scar tissue.  Will reeval in 6 weeks.  Injection: Area was prepped with alcohol pad.  Long finger pip and mcp were injected with total 1.5cc lidocaine and 1.5cc kenalog.  He tolerated well.

## 2018-10-07 NOTE — Progress Notes (Signed)
New Patient Office Visit  Subjective:  Patient ID: Zachary Gillespie, male    DOB: 07-Aug-1965  Age: 53 y.o. MRN: [id number]  CC:  Chief Complaint  Patient presents with  . Advice Only    for scar on (L) hand     HPI [name] presents for evaluation of left hand stiffness.  He was injured by a table saw back in May and underwent an index ray amputation by Dr. Fredna Dow.  There were two partial lacerations to the EDC long finger that were repaired.  Fillet flap from index ray and FTSG were used for dorsal coverage.  His main problem is inability to fully flex the long finger.  Dr Fredna Dow was concerned w scar tissue limiting flexion.  Additionally he has a firm nodule in the webspace between thumb and long finger that moves when he imagines flexing and extending his amputated index finger.  He has been in therapy but seems to have plateaued in terms of his progress.  Past Medical History:  Diagnosis Date  . DDD (degenerative disc disease), lumbar   . Dyslipidemia   . GERD (gastroesophageal reflux disease)   . History of hiatal hernia   . History of kidney stones    H/O  . HLD (hyperlipidemia)   . Hypertension   . Obesity   . Prediabetes   . Sciatica of left side   . Sleep apnea    AHI 34  . Tubular adenoma of colon    2019 (Dr. Benson Norway)    Past Surgical History:  Procedure Laterality Date  . AMPUTATION Left 05/16/2018   Procedure: Ray Resection Left Index Finger;  Surgeon: [name], MD;  Location: [place];  Service: Orthopedics;  Laterality: Left;  . COLONOSCOPY    . ESOPHAGOGASTRODUODENOSCOPY    . EXTRACORPOREAL SHOCK WAVE LITHOTRIPSY Left 07/24/2017   Procedure: EXTRACORPOREAL SHOCK WAVE LITHOTRIPSY (ESWL);  Surgeon: [name], MD;  Location: [facility];  Service: Urology;  Laterality: Left;  . EXTRACORPOREAL SHOCK WAVE LITHOTRIPSY Right 12/11/2017   Procedure: EXTRACORPOREAL SHOCK WAVE LITHOTRIPSY (ESWL);  Surgeon: [name], MD;  Location: [facility];  Service:  Urology;  Laterality: Right;  . EYE SURGERY    . INGUINAL HERNIA REPAIR Right 08/08/2017   Procedure: LAPAROSCOPIC INGUINAL HERNIA;  Surgeon: [name], MD;  Location: [facility];  Service: General;  Laterality: Right;  . NERVE REPAIR Left 05/16/2018   Procedure: Cutaneous Nerve Repair;  Surgeon: [name], MD;  Location: [place];  Service: Orthopedics;  Laterality: Left;  . SKIN FULL THICKNESS GRAFT Left 05/16/2018   Procedure: Skin Graft Full Thickness;  Surgeon: [name], MD;  Location: [place];  Service: Orthopedics;  Laterality: Left;  . TENDON REPAIR Left 05/16/2018   Procedure: Extensor Tendon Repair;  Surgeon: [name], MD;  Location: [place];  Service: Orthopedics;  Laterality: Left;  . TONSILLECTOMY    . VASECTOMY      Family History  Problem Relation Age of Onset  . Heart disease Father   . Diabetes Father   . Hyperlipidemia Father   . Diabetes Sister   . Heart disease Paternal Grandfather   . Bladder Cancer Neg Hx   . Kidney cancer Neg Hx   . Prostate cancer Neg Hx     Social History   Socioeconomic History  . Marital status: Married    Spouse name: Not on file  . Number of children: 1  . Years of education: college  . Highest education level: Not on  file  Occupational History  . Not on file  Social Needs  . Financial resource strain: Not on file  . Food insecurity    Worry: Not on file    Inability: Not on file  . Transportation needs    Medical: Not on file    Non-medical: Not on file  Tobacco Use  . Smoking status: Never Smoker  . Smokeless tobacco: Never Used  Substance and Sexual Activity  . Alcohol use: Yes    Comment: RARE  . Drug use: No  . Sexual activity: Yes  Lifestyle  . Physical activity    Days per week: Not on file    Minutes per session: Not on file  . Stress: Not on file  Relationships  . Social Herbalist on phone: Not on file    Gets together: Not on file    Attends religious service: Not on file    Active  member of club or organization: Not on file    Attends meetings of clubs or organizations: Not on file    Relationship status: Not on file  . Intimate partner violence    Fear of current or ex partner: Not on file    Emotionally abused: Not on file    Physically abused: Not on file    Forced sexual activity: Not on file  Other Topics Concern  . Not on file  Social History Narrative   Drinks about 1 cup of tea a day     ROS Review of Systems  Constitutional: Negative for activity change, appetite change, chills and fatigue.  Respiratory: Negative for apnea, cough and chest tightness.     Objective:   Today's Vitals: BP (!) 144/88 (BP Location: Left Arm, Patient Position: Sitting, Cuff Size: Large)   Pulse 81   Temp (!) 97.5 F (36.4 C) (Temporal)   Ht 5\' 9"  (1.753 m)   Wt 290 lb 12.8 oz (131.9 kg)   SpO2 98%   BMI 42.94 kg/m   Physical Exam Constitutional:      Appearance: Normal appearance. He is normal weight.  Musculoskeletal:     Comments: Left hand: Fingers well perfused w good capillary refill.  Palp radial pulse.  Sensation intact throughout.  Near full extension throughout.  Small and ring finger can flex to about 1cm from palm.  Long finger about 2cm from palm w full active flexion.  Stiffness at pip and mcp joints passively of long finger.  PIP passively flexes to about 70-80 degrees and MP to about the same.  FDP and FDS both intact.  Supple skin graft dorsal to long finger prox phalanx.  Firm area in first webspace.  Sensation changes with firm palpation.    Neurological:     Mental Status: He is alert.     Assessment & Plan:   Problem List Items Addressed This Visit      Other   Joint stiffness of hand, left    Pt overall has a good result given the extent of his injury.  PIP and MCP stiffness are contributing to flexion limitation for long finger as demonstrated by passive limitations.  The firm nodule could be scar tissue or possibly HO so will get xray  to evaluate.  Will do steroid injection today in and around pip and mcp to see if that helps loosen scar tissue.  Will reeval in 6 weeks.  Injection: Area was prepped with alcohol pad.  Long finger pip and mcp were injected  with total 1.5cc lidocaine and 1.5cc kenalog.  He tolerated well.       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 aspirin EC 81 MG tablet Take 81 mg by mouth at bedtime.  Marland Kitchen atorvastatin (LIPITOR) 40 MG tablet Take 1 tablet (40 mg total) by mouth daily.  . celecoxib (CELEBREX) 200 MG capsule TAKE 1 CAPSULE DAILY  . cetirizine (ZYRTEC) 10 MG tablet TAKE 1 TABLET DAILY (Patient taking differently: Take 10 mg by mouth daily. "No Therapeutic Substitution")  . cyclobenzaprine (FLEXERIL) 10 MG tablet Take 1 tablet (10 mg total) by mouth 3 (three) times daily as needed for muscle spasms.  Marland Kitchen escitalopram (LEXAPRO) 10 MG tablet TAKE 1 TABLET DAILY  . fluticasone (FLONASE) 50 MCG/ACT nasal spray USE 2 SPRAYS IN EACH NOSTRIL DAILY (Patient taking differently: Place 2 sprays into both nostrils daily as needed for allergies or rhinitis. )  . hydrochlorothiazide (HYDRODIURIL) 25 MG tablet TAKE 1 TABLET DAILY (Patient taking differently: Take 25 mg by mouth daily. )  . losartan (COZAAR) 100 MG tablet TAKE 1 TABLET DAILY  . Melatonin 10 MG TABS Take 10 mg by mouth at bedtime.   . Multiple Vitamins-Minerals (ONE-A-DAY MENS HEALTH FORMULA PO) Take 1 tablet by mouth daily.  . Omega-3 Krill Oil 500 MG CAPS Take 500 mg by mouth daily.   Marland Kitchen omeprazole (PRILOSEC) 40 MG capsule TAKE 1 CAPSULE DAILY  . pregabalin (LYRICA) 50 MG capsule Take 1 capsule (50 mg total) by mouth 2 (two) times daily.  . [DISCONTINUED] atorvastatin (LIPITOR) 20 MG tablet TAKE 1 TABLET DAILY  . [DISCONTINUED]  doxycycline (VIBRAMYCIN) 50 MG capsule Take 2 capsules (100 mg total) by mouth 2 (two) times daily.  . [DISCONTINUED] HYDROcodone-acetaminophen (NORCO) 5-325 MG tablet 1-2 tabs po q6 hours prn pain  . [DISCONTINUED] Insulin Pen Needle (BD PEN NEEDLE MICRO U/F) 32G X 6 MM MISC USE AS DIRECTED TO INJECT SAXENDA SQ QD  . [DISCONTINUED] Liraglutide -Weight Management (SAXENDA) 18 MG/3ML SOPN Inject 3 mg into the skin daily. Increase by 0.6mg  q week until max dose of 3mg  is reached (Patient taking differently: Inject 1.8 mg into the skin daily. "Increase by 0.6 mg every week until max dose of 3 mg is reached")   No facility-administered encounter medications on file as of 10/07/2018.     Follow-up: No follow-ups on file.   Cindra Presume, MD

## 2018-10-12 NOTE — Procedures (Signed)
PATIENT'S NAME:  Zachary Gillespie, Zachary Gillespie DOB:      08/19/1965      MR#:    [id number]     DATE OF RECORDING: 10/02/2018  Rudene Christians  REFERRING M.D.:  Jenna Luo, MD Study Performed:   Titration to positive airway pressure HISTORY:  Mr. Zachary Gillespie is a 53 year old male patient and returns after his polysomnography from 08-21-2018, which confirmed the presence of obstructive sleep apnea.  The total APNEA/HYPOPNEA INDEX (AHI) was 34.1 /hour.  The REM AHI was 23.2 /hour, versus a non-REM AHI of 35.3/h. The supine AHI was 47.0 versus a non-supine AHI of 8.3/h.  O2 nadir was 84%. Total Time spent below 89% saturation equaled 26 minutes.  DDD, dyslipidemia, GERD, hypertension, kidney stones, obesity and sciatica of left side. The patient endorsed the Epworth Sleepiness Scale at 10 points.   The patient's weight 278 pounds with a height of 69 (inches), resulting in a BMI of 41.1 kg/m2. The patient's neck circumference measured 17.5 inches.  CURRENT MEDICATIONS: Celecoxib, Cetirizine, Escitalopram, Fluticasone, Losartan, Melatonin, Multi-Vitamin, Omega-3 and Omeprazole  PROCEDURE:  This is a multichannel digital polysomnogram utilizing the SomnoStar 11.2 system.  Electrodes and sensors were applied and monitored per AASM Specifications.   EEG, EOG, Chin and Limb EMG, were sampled at 200 Hz.  ECG, Snore and Nasal Pressure, Thermal Airflow, Respiratory Effort, CPAP Flow and Pressure, Oximetry was sampled at 50 Hz. Digital video and audio were recorded.      CPAP was initiated at 5 cmH20 with heated humidity per AASM split night standards under the use of a [employer] FFM Simplus in medium size. The CPAP pressure was advanced to 14.0cmH20 because of hypopneas, apneas and desaturations the AHI was 5.6/h. The technician changed to BiPAP at 15/11 and advanced to Bi{AP 16/11 cm water for patient comfort,  At a BiPAP pressure of 16/11 cmH20, there was a reduction of the AHI to 0.0 during a 43 minute sleep time , 02 nadir  was 91%, with improvement of sleep apnea. Lights Out was at 22:01 and Lights On at 04:04. Total recording time (TRT) was 363 minutes, with a total sleep time (TST) of 332 minutes. The patient's sleep latency was 10.5 minutes. REM latency was 105.5 minutes.  The sleep efficiency was 91.5 %.    SLEEP ARCHITECTURE: WASO (Wake after sleep onset) was 18.5 minutes.  There were 33.5 minutes in Stage N1, 195 minutes Stage N2, 53.5 minutes Stage N3 and 50 minutes in Stage REM.  The percentage of Stage N1 was 10.1%, Stage N2 was 58.7%, Stage N3 was 16.1% and Stage R (REM sleep) was 15.1%.   RESPIRATORY ANALYSIS:  There was a total of 20 respiratory events: 0 apneas and 20 hypopneas.     The total APNEA/HYPOPNEA INDEX (AHI) was 3.6 /hour and the total RESPIRATORY DISTURBANCE INDEX was 3.6 /hour.  5 events occurred in REM sleep and 15 events in NREM. The REM AHI was 6.0 /hour versus a non-REM AHI of 3.2 /hour. The patient spent 189.5 minutes of total sleep time in the supine position and 143 minutes in non-supine. The supine AHI was 2.8/h, versus a non-supine AHI of 4.6/h. Snoring resolved under BiPAP.   OXYGEN SATURATION & C02:  The baseline 02 saturation was 94%, with the lowest being 90%.  The arousals were noted as: 35 were spontaneous, 0 were associated with PLMs, and 7 were associated with respiratory events. The patient had a total of 0 Periodic Limb Movements.  Audio and  video analysis did not show any abnormal or unusual movements, behaviors, phonations or vocalizations.  EKG was in keeping with normal sinus rhythm (NSR). The patient was fitted with a FFM SIMPLUS medium-sized mask.  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   PLANS/RECOMMENDATIONS: I will order a BiPAP setting of 16/11 cm water pressure, no ST was needed, and no oxygen was needed. A  follow up appointment will be scheduled in the Sleep Clinic at [facility].   Please call [phone] with any questions.      I certify that I have reviewed the entire raw data recording prior to the issuance of this report in accordance with the Standards of Accreditation of the American Academy of Sleep Medicine (AASM)   Larey Seat, M.D.     10-12-2018 Diplomat, American Board of Psychiatry and Neurology  Diplomat, [place] of Sleep Medicine Medical Director, [place] [facility] [employer]

## 2018-10-12 NOTE — Addendum Note (Signed)
Addended by: Larey Seat on: 10/12/2018 04:32 PM   Modules accepted: Orders

## 2018-10-13 ENCOUNTER — Telehealth: Payer: Self-pay | Admitting: Neurology

## 2018-10-13 NOTE — Telephone Encounter (Signed)
I called pt. I advised pt that Dr. Brett Fairy reviewed their sleep study results and found that pt was treated well on a BiPAP. Dr. Brett Fairy recommends that pt starts BiPAP 16/11 cm water pressure. I reviewed PAP compliance expectations with the pt. Pt is agreeable to starting a BiPAP. I advised pt that an order will be sent to a DME, [facility], and [facility] will call the pt within about one week after they file with the pt's insurance. [facility] will show the pt how to use the machine, fit for masks, and troubleshoot the BiPAP if needed. A follow up appt was made for insurance purposes with Dr. Brett Fairy on Jan 11,2020 at 2:30pm. Pt verbalized understanding to arrive 15 minutes early and bring their CPAP. A letter with all of this information in it will be mailed to the pt as a reminder. I verified with the pt that the address we have on file is correct. Pt verbalized understanding of results. Pt had no questions at this time but was encouraged to call back if questions arise. I have sent the order to [facility] and have received confirmation that they have received the order.

## 2018-10-13 NOTE — Telephone Encounter (Signed)
-----   Message from Larey Seat, MD sent at 10/12/2018  4:32 PM EDT ----- DIAGNOSIS  Obstructive Sleep Apnea responded well to BIPAP at 16/11 cm water  with the use of a FFM SIMPLUS medium-sized mask, UARS (Upper  Airway Resistance Syndrome) was corrected under BiPAP.   PLANS/RECOMMENDATIONS:  I will order a BiPAP setting of 16/11 cm water pressure, no ST  was needed, and no oxygen was needed.  A follow up appointment will be scheduled in the Sleep Clinic at  [facility].  Please call [phone] with  any questions.

## 2018-11-09 ENCOUNTER — Encounter: Payer: Self-pay | Admitting: Family Medicine

## 2018-11-09 MED ORDER — ATORVASTATIN CALCIUM 40 MG PO TABS: 40.0000 mg | ORAL_TABLET | Freq: Every day | ORAL | 3 refills | Status: DC

## 2018-11-18 ENCOUNTER — Ambulatory Visit (INDEPENDENT_AMBULATORY_CARE_PROVIDER_SITE_OTHER): Admitting: Plastic Surgery

## 2018-11-18 ENCOUNTER — Encounter: Payer: Self-pay | Admitting: Plastic Surgery

## 2018-11-18 ENCOUNTER — Other Ambulatory Visit: Payer: Self-pay

## 2018-11-18 ENCOUNTER — Encounter: Payer: Self-pay | Admitting: Family Medicine

## 2018-11-18 VITALS — BP 129/78 | HR 79 | Temp 97.8°F | Ht 69.0 in | Wt 298.4 lb

## 2018-11-18 DIAGNOSIS — M25642 Stiffness of left hand, not elsewhere classified: Secondary | ICD-10-CM

## 2018-11-18 NOTE — Progress Notes (Signed)
Referring Provider [name], MD [address] [place],  [place] [address]   CC:  Chief Complaint  Patient presents with  . Follow-up    6 weeks stiffness of finger joint of (L) hand      Zachary Gillespie is an 53 y.o. male.  HPI: Patient is here for follow-up after steroid injection of his left long finger.  He had some stiffness in that area and potential tethering by the overlying scar.  This was a result of saw injury that left him with an index ray amputation and the need for full-thickness skin graft over the dorsal aspect of the long finger.  Kenalog was placed in the area about 6 weeks ago when he feels like he has gotten some benefit.  He has full extension and can pre much get the tip of the long finger down to his palm.  I did get an x-ray and he has a small retained bone fragment potentially the sesamoid bone in line with the index ray.  Allergies  Allergen Reactions  . Penicillins Anaphylaxis and Other (See Comments)    Has patient had a PCN reaction causing immediate rash, facial/tongue/throat swelling, SOB or lightheadedness with hypotension: Yes Has patient had a PCN reaction causing severe rash involving mucus membranes or skin necrosis: No Has patient had a PCN reaction that required hospitalization: Yes Has patient had a PCN reaction occurring within the last 10 years: No If all of the above answers are "NO", then may proceed with Cephalosporin use.   . Sulfa Antibiotics Swelling and Other (See Comments)    Childhood allergy- site of swelling not recalled  . Keflex [Cephalexin] Other (See Comments)    Childhood allergy- reaction not recalled    Outpatient Encounter Medications as of 11/18/2018  Medication Sig  . aspirin EC 81 MG tablet Take 81 mg by mouth at bedtime.  Marland Kitchen atorvastatin (LIPITOR) 40 MG tablet Take 1 tablet (40 mg total) by mouth daily.  . celecoxib (CELEBREX) 200 MG capsule TAKE 1 CAPSULE DAILY  . cetirizine (ZYRTEC) 10 MG tablet  TAKE 1 TABLET DAILY (Patient taking differently: Take 10 mg by mouth daily. "No Therapeutic Substitution")  . cyclobenzaprine (FLEXERIL) 10 MG tablet Take 1 tablet (10 mg total) by mouth 3 (three) times daily as needed for muscle spasms.  Marland Kitchen escitalopram (LEXAPRO) 10 MG tablet TAKE 1 TABLET DAILY  . fluticasone (FLONASE) 50 MCG/ACT nasal spray USE 2 SPRAYS IN EACH NOSTRIL DAILY (Patient taking differently: Place 2 sprays into both nostrils daily as needed for allergies or rhinitis. )  . hydrochlorothiazide (HYDRODIURIL) 25 MG tablet TAKE 1 TABLET DAILY (Patient taking differently: Take 25 mg by mouth daily. )  . losartan (COZAAR) 100 MG tablet TAKE 1 TABLET DAILY  . Melatonin 10 MG TABS Take 10 mg by mouth at bedtime.   . Multiple Vitamins-Minerals (ONE-A-DAY MENS HEALTH FORMULA PO) Take 1 tablet by mouth daily.  . Omega-3 Krill Oil 500 MG CAPS Take 500 mg by mouth daily.   Marland Kitchen omeprazole (PRILOSEC) 40 MG capsule TAKE 1 CAPSULE DAILY  . [DISCONTINUED] pregabalin (LYRICA) 50 MG capsule Take 1 capsule (50 mg total) by mouth 2 (two) times daily.   No facility-administered encounter medications on file as of 11/18/2018.      Past Medical History:  Diagnosis Date  . DDD (degenerative disc disease), lumbar   . Dyslipidemia   . GERD (gastroesophageal reflux disease)   . History of hiatal hernia   .  History of kidney stones    H/O  . HLD (hyperlipidemia)   . Hypertension   . Obesity   . Prediabetes   . Sciatica of left side   . Sleep apnea    AHI 34  . Tubular adenoma of colon    2019 (Dr. Benson Norway)    Past Surgical History:  Procedure Laterality Date  . AMPUTATION Left 05/16/2018   Procedure: Ray Resection Left Index Finger;  Surgeon: [name], MD;  Location: [place];  Service: Orthopedics;  Laterality: Left;  . COLONOSCOPY    . ESOPHAGOGASTRODUODENOSCOPY    . EXTRACORPOREAL SHOCK WAVE LITHOTRIPSY Left 07/24/2017   Procedure: EXTRACORPOREAL SHOCK WAVE LITHOTRIPSY (ESWL);  Surgeon: [name], MD;  Location: [facility];  Service: Urology;  Laterality: Left;  . EXTRACORPOREAL SHOCK WAVE LITHOTRIPSY Right 12/11/2017   Procedure: EXTRACORPOREAL SHOCK WAVE LITHOTRIPSY (ESWL);  Surgeon: [name], MD;  Location: [facility];  Service: Urology;  Laterality: Right;  . EYE SURGERY    . INGUINAL HERNIA REPAIR Right 08/08/2017   Procedure: LAPAROSCOPIC INGUINAL HERNIA;  Surgeon: [name], MD;  Location: [facility];  Service: General;  Laterality: Right;  . NERVE REPAIR Left 05/16/2018   Procedure: Cutaneous Nerve Repair;  Surgeon: [name], MD;  Location: [place];  Service: Orthopedics;  Laterality: Left;  . SKIN FULL THICKNESS GRAFT Left 05/16/2018   Procedure: Skin Graft Full Thickness;  Surgeon: [name], MD;  Location: [place];  Service: Orthopedics;  Laterality: Left;  . TENDON REPAIR Left 05/16/2018   Procedure: Extensor Tendon Repair;  Surgeon: [name], MD;  Location: [place];  Service: Orthopedics;  Laterality: Left;  . TONSILLECTOMY    . VASECTOMY      Family History  Problem Relation Age of Onset  . Heart disease Father   . Diabetes Father   . Hyperlipidemia Father   . Diabetes Sister   . Heart disease Paternal Grandfather   . Bladder Cancer Neg Hx   . Kidney cancer Neg Hx   . Prostate cancer Neg Hx     Social History   Social History Narrative   Drinks about 1 cup of tea a day      Review of Systems General: Denies fevers, chills, weight loss CV: Denies chest pain, shortness of breath, palpitations  Physical Exam Vitals with BMI 11/18/2018 10/07/2018 06/18/2018  Height 5\' 9"  5\' 9"  5\' 9"   Weight 298 lbs 6 oz 290 lbs 13 oz 275 lbs  BMI 44.05 99991111 [id number]  Systolic [id number]  Diastolic 78 88 74  Pulse 79 81 105    General:  No acute distress,  Alert and oriented, Non-Toxic, Normal speech and affect On examination of the left hand everything looks great.  There is minimal skin hypopigmentation.  He does have improved range of motion from his last  visit.  Assessment/Plan Presenting for follow-up after steroid injection with moderate improvement of soft tissue tethering.  At the moment he feels satisfied and we will plan to let things be for now.  If he does want another steroid injection I had be happy to do that after another month or so.  Cindra Presume 11/18/2018, 11:55 AM

## 2018-11-19 ENCOUNTER — Other Ambulatory Visit: Payer: Self-pay

## 2018-11-20 ENCOUNTER — Ambulatory Visit (INDEPENDENT_AMBULATORY_CARE_PROVIDER_SITE_OTHER): Admitting: Family Medicine

## 2018-11-20 ENCOUNTER — Other Ambulatory Visit

## 2018-11-20 ENCOUNTER — Encounter: Payer: Self-pay | Admitting: Family Medicine

## 2018-11-20 VITALS — BP 124/74 | HR 80 | Temp 98.1°F | Resp 18 | Ht 69.0 in | Wt 299.0 lb

## 2018-11-20 DIAGNOSIS — R7303 Prediabetes: Secondary | ICD-10-CM | POA: Diagnosis not present

## 2018-11-20 NOTE — Progress Notes (Signed)
Subjective:     Patient ID: Zachary Gillespie, male   DOB: 07-30-1965, 53 y.o.   MRN: [id number]  HPI Wt Readings from Last 3 Encounters:  11/20/18 299 lb (135.6 kg)  11/18/18 298 lb 6.4 oz (135.4 kg)  10/07/18 290 lb 12.8 oz (131.9 kg)   Patient has discontinued Saxenda.  His weight has now increased to 299 pounds.  He states that he is walking 30 minutes a day.  He is eating a sensible diet.  His wife prepares his food and she is on weight watchers.  He eats the same amount that she eats and she is less than 100 pounds.  However despite exercising daily and eating small amounts, the patient continues to see weight gain.  He has a history of prediabetes and therefore I believe the patient is dealing with insulin resistance.  We have not checked his thyroid since 2014.  He also has hypogonadism which is not being treated because when he tried testosterone in the past he experienced "roid rage."  I believe all of these metabolic factors and his underlying metabolic syndrome is likely exacerbating his weight gain and making it difficult for him to lose weight.  He denies any chest pain shortness of breath or dyspnea on exertion. Past Medical History:  Diagnosis Date  . DDD (degenerative disc disease), lumbar   . Dyslipidemia   . GERD (gastroesophageal reflux disease)   . History of hiatal hernia   . History of kidney stones    H/O  . HLD (hyperlipidemia)   . Hypertension   . Obesity   . Prediabetes   . Sciatica of left side   . Sleep apnea    AHI 34  . Tubular adenoma of colon    2019 (Dr. Benson Norway)   Past Surgical History:  Procedure Laterality Date  . AMPUTATION Left 05/16/2018   Procedure: Ray Resection Left Index Finger;  Surgeon: [name], MD;  Location: [place];  Service: Orthopedics;  Laterality: Left;  . COLONOSCOPY    . ESOPHAGOGASTRODUODENOSCOPY    . EXTRACORPOREAL SHOCK WAVE LITHOTRIPSY Left 07/24/2017   Procedure: EXTRACORPOREAL SHOCK WAVE LITHOTRIPSY (ESWL);  Surgeon:  [name], MD;  Location: [facility];  Service: Urology;  Laterality: Left;  . EXTRACORPOREAL SHOCK WAVE LITHOTRIPSY Right 12/11/2017   Procedure: EXTRACORPOREAL SHOCK WAVE LITHOTRIPSY (ESWL);  Surgeon: [name], MD;  Location: [facility];  Service: Urology;  Laterality: Right;  . EYE SURGERY    . INGUINAL HERNIA REPAIR Right 08/08/2017   Procedure: LAPAROSCOPIC INGUINAL HERNIA;  Surgeon: [name], MD;  Location: [facility];  Service: General;  Laterality: Right;  . NERVE REPAIR Left 05/16/2018   Procedure: Cutaneous Nerve Repair;  Surgeon: [name], MD;  Location: [place];  Service: Orthopedics;  Laterality: Left;  . SKIN FULL THICKNESS GRAFT Left 05/16/2018   Procedure: Skin Graft Full Thickness;  Surgeon: [name], MD;  Location: [place];  Service: Orthopedics;  Laterality: Left;  . TENDON REPAIR Left 05/16/2018   Procedure: Extensor Tendon Repair;  Surgeon: [name], MD;  Location: [place];  Service: Orthopedics;  Laterality: Left;  . TONSILLECTOMY    . VASECTOMY     Current Outpatient Medications on File Prior to Visit  Medication Sig Dispense Refill  . aspirin EC 81 MG tablet Take 81 mg by mouth at bedtime.    Marland Kitchen atorvastatin (LIPITOR) 40 MG tablet Take 1 tablet (40 mg total) by mouth daily. 90 tablet 3  . celecoxib (CELEBREX) 200 MG capsule  TAKE 1 CAPSULE DAILY 90 capsule 4  . cetirizine (ZYRTEC) 10 MG tablet TAKE 1 TABLET DAILY (Patient taking differently: Take 10 mg by mouth daily. "No Therapeutic Substitution") 90 tablet 4  . escitalopram (LEXAPRO) 10 MG tablet TAKE 1 TABLET DAILY 90 tablet 3  . fluticasone (FLONASE) 50 MCG/ACT nasal spray USE 2 SPRAYS IN EACH NOSTRIL DAILY (Patient taking differently: Place 2 sprays into both nostrils daily as needed for allergies or rhinitis. ) 48 g 4  . hydrochlorothiazide (HYDRODIURIL) 25 MG tablet TAKE 1 TABLET DAILY (Patient taking differently: Take 25 mg by mouth daily. ) 90 tablet 3  . losartan (COZAAR) 100 MG tablet TAKE 1  TABLET DAILY 90 tablet 3  . Melatonin 10 MG TABS Take 10 mg by mouth at bedtime.     . Multiple Vitamins-Minerals (ONE-A-DAY MENS HEALTH FORMULA PO) Take 1 tablet by mouth daily.    . Omega-3 Krill Oil 500 MG CAPS Take 500 mg by mouth daily.     Marland Kitchen omeprazole (PRILOSEC) 40 MG capsule TAKE 1 CAPSULE DAILY 90 capsule 3   No current facility-administered medications on file prior to visit.    Allergies  Allergen Reactions  . Penicillins Anaphylaxis and Other (See Comments)    Has patient had a PCN reaction causing immediate rash, facial/tongue/throat swelling, SOB or lightheadedness with hypotension: Yes Has patient had a PCN reaction causing severe rash involving mucus membranes or skin necrosis: No Has patient had a PCN reaction that required hospitalization: Yes Has patient had a PCN reaction occurring within the last 10 years: No If all of the above answers are "NO", then may proceed with Cephalosporin use.   . Sulfa Antibiotics Swelling and Other (See Comments)    Childhood allergy- site of swelling not recalled  . Keflex [Cephalexin] Other (See Comments)    Childhood allergy- reaction not recalled   Social History   Socioeconomic History  . Marital status: Married    Spouse name: Not on file  . Number of children: 1  . Years of education: college  . Highest education level: Not on file  Occupational History  . Not on file  Social Needs  . Financial resource strain: Not on file  . Food insecurity    Worry: Not on file    Inability: Not on file  . Transportation needs    Medical: Not on file    Non-medical: Not on file  Tobacco Use  . Smoking status: Never Smoker  . Smokeless tobacco: Never Used  Substance and Sexual Activity  . Alcohol use: Yes    Comment: RARE  . Drug use: No  . Sexual activity: Yes  Lifestyle  . Physical activity    Days per week: Not on file    Minutes per session: Not on file  . Stress: Not on file  Relationships  . Social Product manager on phone: Not on file    Gets together: Not on file    Attends religious service: Not on file    Active member of club or organization: Not on file    Attends meetings of clubs or organizations: Not on file    Relationship status: Not on file  . Intimate partner violence    Fear of current or ex partner: Not on file    Emotionally abused: Not on file    Physically abused: Not on file    Forced sexual activity: Not on file  Other Topics Concern  . Not on  file  Social History Narrative   Drinks about 1 cup of tea a day      Review of Systems  All other systems reviewed and are negative.      Objective:   Physical Exam Vitals signs reviewed.  Constitutional:      Appearance: He is well-developed.  Cardiovascular:     Rate and Rhythm: Normal rate and regular rhythm.     Heart sounds: Normal heart sounds.  Pulmonary:     Effort: Pulmonary effort is normal. No respiratory distress.     Breath sounds: Normal breath sounds. No stridor. No wheezing or rales.  Abdominal:     General: Bowel sounds are normal. There is no distension.     Palpations: Abdomen is soft. There is no mass.     Tenderness: There is no abdominal tenderness. There is no guarding or rebound.        Assessment:    Prediabetes - Plan: Hemoglobin A1c, CBC with Differential, COMPLETE METABOLIC PANEL WITH GFR, Lipid Panel, TSH  Morbid obesity ([place]) - Plan: TSH     Plan:     For someone to check a TSH to rule out hypothyroidism.  If his TSH is normal, I suspect the majority of the patient's issues stem from his metabolic issues including his insulin resistance.  Therefore I would recommend starting the patient on metformin and uptitrating 2000 mg twice daily to help improve his insulin resistance.  I also recommended an hour a day of vigorous aerobic exercise rather than just simply walking.  I would also recommend having the patient meet with a nutritionist and restricting his calories to 1500 cal/day.   I would also encourage the patient to resume Saxenda or potentially Topamax for an appetite suppressant

## 2018-11-21 LAB — HEMOGLOBIN A1C
Hgb A1c MFr Bld: 7.7 % of total Hgb — ABNORMAL HIGH (ref <5.7)
Mean Plasma Glucose: 174 (calc)
eAG (mmol/L): 9.7 (calc)

## 2018-11-21 LAB — LIPID PANEL
Cholesterol: 108 mg/dL (ref <200)
HDL: 33 mg/dL — ABNORMAL LOW (ref >=40)
LDL Cholesterol (Calc): 56 mg/dL (calc)
Non-HDL Cholesterol (Calc): 75 mg/dL (calc) (ref <130)
Total CHOL/HDL Ratio: 3.3 (calc) (ref <5.0)
Triglycerides: 106 mg/dL (ref <150)

## 2018-11-21 LAB — CBC WITH DIFFERENTIAL/PLATELET
Absolute Monocytes: 510 cells/uL (ref 200–950)
Basophils Absolute: 68 cells/uL (ref 0–200)
Basophils Relative: 0.8 %
Eosinophils Absolute: 527 cells/uL — ABNORMAL HIGH (ref 15–500)
Eosinophils Relative: 6.2 %
HCT: 36.5 % — ABNORMAL LOW (ref 38.5–50.0)
Hemoglobin: 11.6 g/dL — ABNORMAL LOW (ref 13.2–17.1)
Lymphs Abs: 1802 cells/uL (ref 850–3900)
MCH: 24.2 pg — ABNORMAL LOW (ref 27.0–33.0)
MCHC: 31.8 g/dL — ABNORMAL LOW (ref 32.0–36.0)
MCV: 76 fL — ABNORMAL LOW (ref 80.0–100.0)
MPV: 11 fL (ref 7.5–12.5)
Monocytes Relative: 6 %
Neutro Abs: 5593 cells/uL (ref 1500–7800)
Neutrophils Relative %: 65.8 %
Platelets: 353 10*3/uL (ref 140–400)
RBC: 4.8 10*6/uL (ref 4.20–5.80)
RDW: 15.8 % — ABNORMAL HIGH (ref 11.0–15.0)
Total Lymphocyte: 21.2 %
WBC: 8.5 10*3/uL (ref 3.8–10.8)

## 2018-11-21 LAB — COMPLETE METABOLIC PANEL WITH GFR
AG Ratio: 1.9 (calc) (ref 1.0–2.5)
ALT: 22 U/L (ref 9–46)
AST: 13 U/L (ref 10–35)
Albumin: 4.3 g/dL (ref 3.6–5.1)
Alkaline phosphatase (APISO): 85 U/L (ref 35–144)
BUN: 13 mg/dL (ref 7–25)
CO2: 26 mmol/L (ref 20–32)
Calcium: 9.3 mg/dL (ref 8.6–10.3)
Chloride: 102 mmol/L (ref 98–110)
Creat: 0.83 mg/dL (ref 0.70–1.33)
GFR, Est African American: 117 mL/min/{1.73_m2} (ref >=60)
GFR, Est Non African American: 101 mL/min/{1.73_m2} (ref >=60)
Globulin: 2.3 g/dL (calc) (ref 1.9–3.7)
Glucose, Bld: 137 mg/dL — ABNORMAL HIGH (ref 65–99)
Potassium: 4.4 mmol/L (ref 3.5–5.3)
Sodium: 139 mmol/L (ref 135–146)
Total Bilirubin: 0.4 mg/dL (ref 0.2–1.2)
Total Protein: 6.6 g/dL (ref 6.1–8.1)

## 2018-11-21 LAB — TSH: TSH: 1.1 mIU/L (ref 0.40–4.50)

## 2018-11-23 ENCOUNTER — Other Ambulatory Visit: Payer: Self-pay | Admitting: *Deleted

## 2018-11-23 MED ORDER — METFORMIN HCL 500 MG PO TABS: 1000.0000 mg | ORAL_TABLET | Freq: Two times a day (BID) | ORAL | 1 refills | Status: DC

## 2018-11-23 MED ORDER — PEN NEEDLES 30G X 5 MM MISC: 1.0000 [IU] | Freq: Every day | 3 refills | Status: DC

## 2018-11-23 MED ORDER — SAXENDA 18 MG/3ML ~~LOC~~ SOPN: 3.0000 mg | PEN_INJECTOR | Freq: Every day | SUBCUTANEOUS | 3 refills | Status: DC

## 2018-11-23 MED ORDER — 1ST TIER UNIFINE PENTIPS 32G X 4 MM MISC: 1 refills | Status: DC

## 2018-11-24 ENCOUNTER — Other Ambulatory Visit: Payer: Self-pay | Admitting: Family Medicine

## 2018-11-24 MED ORDER — RELION PEN NEEDLES 32G X 4 MM MISC: 3 refills | Status: DC

## 2018-12-16 ENCOUNTER — Encounter: Payer: Self-pay | Admitting: Family Medicine

## 2018-12-18 ENCOUNTER — Encounter: Payer: Self-pay | Admitting: Family Medicine

## 2018-12-20 ENCOUNTER — Other Ambulatory Visit: Payer: Self-pay | Admitting: Family Medicine

## 2018-12-21 ENCOUNTER — Encounter: Payer: Self-pay | Admitting: Family Medicine

## 2019-01-18 ENCOUNTER — Other Ambulatory Visit: Payer: Self-pay

## 2019-01-18 ENCOUNTER — Encounter: Payer: Self-pay | Admitting: Neurology

## 2019-01-18 ENCOUNTER — Ambulatory Visit (INDEPENDENT_AMBULATORY_CARE_PROVIDER_SITE_OTHER): Admitting: Neurology

## 2019-01-18 VITALS — BP 122/77 | HR 94 | Temp 97.4°F | Ht 69.0 in | Wt 285.0 lb

## 2019-01-18 DIAGNOSIS — G4733 Obstructive sleep apnea (adult) (pediatric): Secondary | ICD-10-CM

## 2019-01-18 DIAGNOSIS — Z6841 Body Mass Index (BMI) 40.0 and over, adult: Secondary | ICD-10-CM

## 2019-01-18 DIAGNOSIS — R7303 Prediabetes: Secondary | ICD-10-CM

## 2019-01-18 DIAGNOSIS — I10 Essential (primary) hypertension: Secondary | ICD-10-CM | POA: Diagnosis not present

## 2019-01-18 NOTE — Progress Notes (Signed)
SLEEP [facility]INE CLINIC   Provider:  Larey Seat, M D  Referring Provider: [name], MD Primary Care Physician:  [name], MD   I01/11/21 at  2:30 PM EST   01-18-2019. RV : I have the pleasure of meeting Kemet Bidlack again he prefers to go by [place].  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  He was referred by Dr. Willaim Sheng for current for a sleep study this was actually a second sleep study and on 21 August 2018 performed with an expanded EEG montage documented moderate to severe apnea with an AHI of 34.1/h interestingly it was not worse during REM sleep which we would expect and obstructive sleep apnea.  Sleep efficiency was over 80% sufficient to make a good diagnosis and the patient had mainly hypopneas instead of frank apnea.  He returned for an attended CPAP titration with a Fisher and p[place] full facemask named Simplus size medium.  CPAP was advanced to 14 cm but significant apneas especially central apneas arose he was therefore changed to BiPAP which he tolerated much better.  The AHI was reduced to 3.6 and he no longer had any oxygen desaturations.  The optimal pressure was 16/11 cmH2O.  The prescription went out on 5 October and I am now able to see an 79 and 30-day download for this patient.  He has been 100% compliant is BiPAP was set for 16/11 cmH2O his residual AHI is 1 apnea per hour.  Excellent.  He also stated that he has started to really appreciate his CPAP his Epworth score was endorsed at 2 points fatigue severity at 12 point and unable to look at his sleep therapy report obtained through his own device.  For the last 90 days he has used the machine every night over 4 hours.  His AHI has been reduced to under 1 and he has many minimal air leakage.  Based on this I would not need to change any settings or any supply orders.  The patient mentioned that he may try an nasal mask I would recommend TEE some for wisp nasal mask for him it would be covering his  nose instead of covering   the nostril but he is free to choose which interface he would prefer.       see below -06-10-2018 : Chief complaint according to patient : He has recently had two surgeries, each time anesthesias were noted to give him prolonged apnea and his wife noted louder snoring. He endorsed more EDS, too.  Marckus Stefanovich is a 54 y.o. male veteran- snoring loudly in supine sleep, but not restless.He has seen combat, was diagnosed with PTSD, seen here for a repeat sleep evaluation and  possible sleep study.    06-10-2018 : Chief complaint according to patient : He has recently had two surgeries, each time anesthesias were noted to give him prolonged apnea and his wife noted louder snoring. He endorsed more EDS, too.  "My  wife sleeps in a different room because of my snoring"   Mr. Degner report that he does not wake up refreshed or restored from his nocturnal sleep. He yawns  as soon as he gets up in the morning and feels that he has not gotten enough sleep. His wife has witnessed him to snore and is actually bothered enough to have moved to a different room. In addition he has to keep physically active and mentally stimulated to not doze off in daytime. 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 His job is physically active.   Dr. Dennard Schaumann wrote that the patient was on a military exercise another soldier stated that his snoring was so bad and recommended he needed to see a doctor. His wife has also witnessed gasping sounds in sleep at night. He has a history of degenerative disc disease lumbar spine confirmed by MRI in 2015 and as his weight has increased his back has gotten worse. He feels stiff when sitting or rising from bed. He has been treated with Celebrex for osteoarthritis, but found no relief.obeisty, HTN, dyslipidemia, GERD, nephrolithiasis ( 2009 ) , sciatia on the  left.  Sleep habits are as follows: He is taking 5 mg of  melatonin at night, multivitamin in the morning, fish oil, Prilosec, Flonase as needed Cozaar, Lexapro daily Flexeril as needed and currently not used, Zyrtec for allergies. Mr. Gyure usually goes to bed at 10 PM and adheres to this regimen on weekdays and weekends, usually he is asleep very promptly, he prefers to sleep on his side, and sleeps on only one pillow for head support. The bedroom is described as cool, quiet and dark. As mentioned above, his wife is often moving to another room. He does not have nocturia. On occasion he will wake up from a choking sensation likely related to apneic breathing. His wife wakes him up in the morning, he does not wake up spontaneously. This is around 6:30 AM. As reported above he feels not refreshed or restored in the way for more sleep soon after he rises.  Sleep medical history and family sleep history: Both parents were loud  snorers but were never formally evaluated for apnea. Maternal grandmother also loud snorer. He has one living sister, smoker, DM and obese. He had a tonsillectomy in childhood, no history of traumatic brain injuries, neck injuries, ENT surgeries.  Social history: married, one daughter - 22 working at the [employer].  Non smoker, non ETOH- caffeine ; no longer drinking mountain dew, but  caffeinated sodas  iced tea at lunch, no coffee.  5 hour energy drinks in preparation for a long drive.   Review of Systems: Out of a complete 14 system review, the patient complains of only the following symptoms, and all other reviewed systems are negative.  Snoring, fatigue, anxiety.    Mr. Seagren endorsed the Epworth sleepiness score at 10 points fatigue severity at 45 points, he is treated with an antidepressant and would currently not consider himself clinically depressed  How likely are you to doze in the following situations: 0 = not likely, 1 = slight chance, 2 = moderate chance, 3 = high chance  Sitting and Reading? Watching  Television? Sitting inactive in a public place (theater or meeting)? Lying down in the afternoon when circumstances permit? Sitting and talking to someone? Sitting quietly after lunch without alcohol? In a car, while stopped for a few minutes in traffic? As a passenger in a car for an hour without a break?  Total = 10 points.  Neck size 17.5#      Social History   Socioeconomic History  . Marital status: Married    Spouse name: Not on file  . Number of children: 1  . Years of education: college  . Highest education level: Not on file  Occupational History  . Not on file  Tobacco Use  . Smoking status: Never Smoker  . Smokeless tobacco: Never Used  Substance and Sexual Activity  . Alcohol use: Yes    Comment: RARE  . Drug use: No  . Sexual activity: Yes  Other Topics Concern  . Not on file  Social History Narrative   Drinks about 1 cup of tea a day    Social Determinants of Health   Financial Resource Strain:   . Difficulty of Paying Living Expenses: Not on file  Food Insecurity:   . Worried About Charity fundraiser in the Last Year: Not on file  . Ran Out of Food in the Last Year: Not on file  Transportation Needs:   . Lack of Transportation (Medical): Not on file  . Lack of Transportation (Non-Medical): Not on file  Physical Activity:   . Days of Exercise per Week: Not on file  . Minutes of Exercise per Session: Not on file  Stress:   . Feeling of Stress : Not on file  Social Connections:   . Frequency of Communication with Friends and Family: Not on file  . Frequency of Social Gatherings with Friends and Family: Not on file  . Attends Religious Services: Not on file  . Active Member of Clubs or Organizations: Not on file  . Attends Archivist Meetings: Not on file  . Marital Status: Not on file  Intimate Partner Violence:   . Fear of Current or Ex-Partner: Not on file  . Emotionally Abused: Not on file  . Physically Abused: Not on file  .  Sexually Abused: Not on file    Family History  Problem Relation Age of Onset  . Heart disease Father   . Diabetes Father   . Hyperlipidemia Father   . Diabetes Sister   . Heart disease Paternal Grandfather   . Bladder Cancer Neg Hx   . Kidney cancer Neg Hx   . Prostate cancer Neg Hx     Past Medical History:  Diagnosis Date  . DDD (degenerative disc disease), lumbar   . Dyslipidemia   . GERD (gastroesophageal reflux disease)   . History of hiatal hernia   . History of kidney stones    H/O  . HLD (hyperlipidemia)   . Hypertension   . Obesity   . Prediabetes   . Sciatica of left side   . Sleep apnea    AHI 34  . Tubular adenoma of colon    2019 (Dr. Benson Norway)    Past Surgical History:  Procedure Laterality Date  . AMPUTATION Left 05/16/2018   Procedure: Ray Resection Left Index Finger;  Surgeon: [name], MD;  Location: [place];  Service: Orthopedics;  Laterality: Left;  . COLONOSCOPY    . ESOPHAGOGASTRODUODENOSCOPY    . EXTRACORPOREAL SHOCK WAVE LITHOTRIPSY Left 07/24/2017   Procedure: EXTRACORPOREAL SHOCK WAVE LITHOTRIPSY (ESWL);  Surgeon: [name], MD;  Location: [facility];  Service: Urology;  Laterality: Left;  . EXTRACORPOREAL SHOCK WAVE LITHOTRIPSY Right 12/11/2017   Procedure: EXTRACORPOREAL SHOCK WAVE LITHOTRIPSY (ESWL);  Surgeon: [name], MD;  Location: [facility];  Service: Urology;  Laterality: Right;  . EYE SURGERY    . INGUINAL HERNIA REPAIR Right 08/08/2017   Procedure: LAPAROSCOPIC INGUINAL HERNIA;  Surgeon: [name], MD;  Location: [facility];  Service: General;  Laterality: Right;  . NERVE REPAIR Left 05/16/2018   Procedure: Cutaneous Nerve Repair;  Surgeon: [name], MD;  Location: [place];  Service: Orthopedics;  Laterality: Left;  . SKIN FULL THICKNESS GRAFT Left 05/16/2018   Procedure: Skin Graft Full Thickness;  Surgeon: [name], MD;  Location: [place];  Service: Orthopedics;  Laterality: Left;  . TENDON REPAIR Left 05/16/2018    Procedure: Extensor Tendon Repair;  Surgeon: [name], MD;  Location: [place];  Service: Orthopedics;  Laterality: Left;  . TONSILLECTOMY    . VASECTOMY      Current Outpatient Medications  Medication Sig Dispense Refill  . aspirin EC 81 MG tablet Take 81 mg by mouth at bedtime.    Marland Kitchen atorvastatin (LIPITOR) 40 MG tablet Take 1 tablet (40 mg total) by mouth daily. 90 tablet 3  . celecoxib (CELEBREX) 200 MG capsule TAKE 1 CAPSULE DAILY 90 capsule 3  . cetirizine (ZYRTEC) 10 MG tablet TAKE 1 TABLET DAILY (Patient taking differently: Take 10 mg by mouth daily. "No Therapeutic Substitution") 90 tablet 4  . escitalopram (LEXAPRO) 10 MG tablet TAKE 1 TABLET DAILY 90 tablet 3  . fluticasone (FLONASE) 50 MCG/ACT nasal spray USE 2 SPRAYS IN EACH NOSTRIL DAILY (Patient taking differently: Place 2 sprays into both nostrils daily as needed for allergies or rhinitis. ) 48 g 4  . hydrochlorothiazide (HYDRODIURIL) 25 MG tablet TAKE 1 TABLET DAILY (Patient taking differently: Take 25 mg by mouth daily. ) 90 tablet 3  . Insulin Pen Needle (RELION PEN NEEDLES) 32G X 4 MM MISC Use daily with Saxenda 100 each 3  . Liraglutide -Weight Management (SAXENDA) 18 MG/3ML SOPN Inject 3 mg into the skin daily. Inject 0.6 mg into the skin daily. Increase by 0.6mg  q week until max dose of 3mg  is reached. 5 pen 3  . losartan (COZAAR) 100 MG tablet TAKE 1 TABLET DAILY 90 tablet 3  . Melatonin 10 MG TABS Take 10 mg by mouth at bedtime.     . metFORMIN (GLUCOPHAGE) 500 MG tablet Take 2 tablets (1,000 mg total) by mouth 2 (two) times daily with a meal. 360 tablet 1  . Multiple Vitamins-Minerals (ONE-A-DAY MENS HEALTH FORMULA PO) Take 1 tablet by mouth daily.    . Omega-3 Krill Oil 500 MG CAPS Take 500 mg by mouth daily.     Marland Kitchen omeprazole (PRILOSEC) 40 MG capsule TAKE 1 CAPSULE DAILY 90 capsule 3   No current facility-administered medications for this visit.    Allergies as of 01/18/2019 - Review Complete 01/18/2019  Allergen  Reaction Noted  . Penicillins Anaphylaxis and Other (See Comments) 04/21/2012  . Sulfa antibiotics Swelling and Other (See Comments) 04/21/2012  . Keflex [cephalexin] Other (See Comments) 04/21/2012    Vitals: BP 122/77   Pulse 94   Temp (!) 97.4 F (36.3 C)   Ht 5\' 9"  (1.753 m)   Wt 285 lb (129.3 kg)   BMI 42.09 kg/m  Last Weight:  Wt Readings from Last 1 Encounters:  01/18/19 285 lb (129.3 kg)   [id number] mass index is 42.09 kg/m.     Last Height:   Ht Readings from Last 1 Encounters:  01/18/19 5\' 9"  (1.753 m)    General: The patient is awake, alert and appears not in acute distress. The patient is well groomed. Head: Normocephalic, atraumatic. Neck is supple.  Mallampati  3- low soft [palate, no tonsils. ,  neck circumference:17. 25 . Nasal airflow congestted .  Retrognathia is not seen. He has worn braces in childhood. Trunk: BMI is 39/kg/m2.     Neurologic exam : The patient is awake and alert, oriented to place and time.    Attention span & concentration ability appears normal.  Speech is fluent,  without  dysarthria, dysphonia or aphasia.  Mood and affect are appropriate.  Cranial nerves: Pupils are equal and briskly reactive to light.Facial motor strength is symmetric and tongue and uvula move midline. Shoulder shrug was symmetrical.   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 All other extremities and ROM in normal range.   Assessment and Plan:For the last 90 days he has used the machine every night over 4 hours.  His AHI has been reduced to under 1 and he has many minimal air leakage.  Based on this I would not need to change any settings or any supply orders.  The patient mentioned that he may try an nasal mask I would recommend [place] or wisp nasal mask for him it would be covering his nose instead of covering   the nostril but he is free to choose which interface he would prefer.  Follow Up Instructions:Yearly RV in 12 month.  I wrote an order for DME to consider  offering him a nasal mask or nasal pillow mask.     I provided 15 minutes of  time during this encounter.  Asencion Partridge Keion Neels MD  01/18/2019   CC: [name], [place] [place],  [place] [id number]

## 2019-01-18 NOTE — Patient Instructions (Signed)
Carbohydrate Counting for Diabetes Mellitus, Adult  Carbohydrate counting is a method of keeping track of how many carbohydrates you eat. Eating carbohydrates naturally increases the amount of sugar (glucose) in the blood. Counting how many carbohydrates you eat helps keep your blood glucose within normal limits, which helps you manage your diabetes (diabetes mellitus). It is important to know how many carbohydrates you can safely have in each meal. This is different for every person. A diet and nutrition specialist (registered dietitian) can help you make a meal plan and calculate how many carbohydrates you should have at each meal and snack. Carbohydrates are found in the following foods:  Grains, such as breads and cereals.  Dried beans and soy products.  Starchy vegetables, such as potatoes, peas, and corn.  Fruit and fruit juices.  Milk and yogurt.  Sweets and snack foods, such as cake, cookies, candy, chips, and soft drinks. How do I count carbohydrates? There are two ways to count carbohydrates in food. You can use either of the methods or a combination of both. Reading "Nutrition Facts" on packaged food The "Nutrition Facts" list is included on the labels of almost all packaged foods and beverages in the [place] It includes:  The serving size.  Information about nutrients in each serving, including the grams (g) of carbohydrate per serving. To use the "Nutrition Facts":  Decide how many servings you will have.  Multiply the number of servings by the number of carbohydrates per serving.  The resulting number is the total amount of carbohydrates that you will be having. Learning standard serving sizes of other foods When you eat carbohydrate foods that are not packaged or do not include "Nutrition Facts" on the label, you need to measure the servings in order to count the amount of carbohydrates:  Measure the foods that you will eat with a food scale or measuring cup, if  needed.  Decide how many standard-size servings you will eat.  Multiply the number of servings by 15. Most carbohydrate-rich foods have about 15 g of carbohydrates per serving. ? For example, if you eat 8 oz (170 g) of strawberries, you will have eaten 2 servings and 30 g of carbohydrates (2 servings x 15 g = 30 g).  For foods that have more than one food mixed, such as soups and casseroles, you must count the carbohydrates in each food that is included. The following list contains standard serving sizes of common carbohydrate-rich foods. Each of these servings has about 15 g of carbohydrates:   hamburger bun or  English muffin.   oz (15 mL) syrup.   oz (14 g) jelly.  1 slice of bread.  1 six-inch tortilla.  3 oz (85 g) cooked rice or pasta.  4 oz (113 g) cooked dried beans.  4 oz (113 g) starchy vegetable, such as peas, corn, or potatoes.  4 oz (113 g) hot cereal.  4 oz (113 g) mashed potatoes or  of a large baked potato.  4 oz (113 g) canned or frozen fruit.  4 oz (120 mL) fruit juice.  4-6 crackers.  6 chicken nuggets.  6 oz (170 g) unsweetened dry cereal.  6 oz (170 g) plain fat-free yogurt or yogurt sweetened with artificial sweeteners.  8 oz (240 mL) milk.  8 oz (170 g) fresh fruit or one small piece of fruit.  24 oz (680 g) popped popcorn. Example of carbohydrate counting Sample meal  3 oz (85 g) chicken breast.  6 oz (170 g)   brown rice.  4 oz (113 g) corn.  8 oz (240 mL) milk.  8 oz (170 g) strawberries with sugar-free whipped topping. Carbohydrate calculation 1. Identify the foods that contain carbohydrates: ? Rice. ? Corn. ? Milk. ? Strawberries. 2. Calculate how many servings you have of each food: ? 2 servings rice. ? 1 serving corn. ? 1 serving milk. ? 1 serving strawberries. 3. Multiply each number of servings by 15 g: ? 2 servings rice x 15 g = 30 g. ? 1 serving corn x 15 g = 15 g. ? 1 serving milk x 15 g = 15 g. ? 1  serving strawberries x 15 g = 15 g. 4. Add together all of the amounts to find the total grams of carbohydrates eaten: ? 30 g + 15 g + 15 g + 15 g = 75 g of carbohydrates total. Summary  Carbohydrate counting is a method of keeping track of how many carbohydrates you eat.  Eating carbohydrates naturally increases the amount of sugar (glucose) in the blood.  Counting how many carbohydrates you eat helps keep your blood glucose within normal limits, which helps you manage your diabetes.  A diet and nutrition specialist (registered dietitian) can help you make a meal plan and calculate how many carbohydrates you should have at each meal and snack. This information is not intended to replace advice given to you by your health care provider. Make sure you discuss any questions you have with your health care provider. Document Revised: 07/18/2016 Document Reviewed: 06/07/2015 Elsevier Patient Education  2020 [employer]  

## 2019-02-02 ENCOUNTER — Telehealth: Payer: Self-pay | Admitting: Family Medicine

## 2019-02-02 NOTE — Telephone Encounter (Signed)
PA Submitted through [email or web address] and received the following:  [employer] is reviewing your PA request and will respond within 24 hours for Medicaid or up to 72 hours for non-Medicaid plans, based on the required timeframe determined by state or federal regulations. To check for an update later, open this request from your dashboard.  Reason for pt not to be on formulary wt loss meds:  Pt tried and failed phentermine. This can only be used for a 3 month period then no further use is recommended due to potential detrimental side effects. He can not take any of the other formulary recommended medication due to his hypertension and hyperlipidemia and the medications he takes to control his medical problems may react with weight loss medications

## 2019-02-04 NOTE — Telephone Encounter (Signed)
Message from plan: CaseId:[id number];Status:Approved;Review Type:Prior Auth;Coverage Start Date:01/03/2019;Coverage End Date:01/06/2098;  Pharm and pt made aware

## 2019-02-08 ENCOUNTER — Encounter: Payer: Self-pay | Admitting: Family Medicine

## 2019-02-11 ENCOUNTER — Encounter: Payer: Self-pay | Admitting: Family Medicine

## 2019-02-11 ENCOUNTER — Ambulatory Visit (INDEPENDENT_AMBULATORY_CARE_PROVIDER_SITE_OTHER): Admitting: Family Medicine

## 2019-02-11 ENCOUNTER — Other Ambulatory Visit: Payer: Self-pay

## 2019-02-11 VITALS — BP 122/70 | HR 88 | Temp 97.4°F | Resp 16 | Ht 69.0 in | Wt 282.0 lb

## 2019-02-11 DIAGNOSIS — R1011 Right upper quadrant pain: Secondary | ICD-10-CM

## 2019-02-11 MED ORDER — HYDROCODONE-ACETAMINOPHEN 5-325 MG PO TABS: 1.0000 | ORAL_TABLET | Freq: Four times a day (QID) | ORAL | 0 refills | Status: DC | PRN

## 2019-02-11 NOTE — Progress Notes (Signed)
Subjective:    Patient ID: Zachary Gillespie, male    DOB: 1965/12/16, 54 y.o.   MRN: [id number]  HPI Patient is a very pleasant 54 year old Caucasian male who presents today with episodic right upper quadrant abdominal pain.  He states that the pain has been present off and on for the last 2 to 3 weeks.  The pain is described as a sharp pressure-like sensation that occurs just below the xiphoid process and slightly to the right.  It occurs typically within a few hours of eating.  He describes it as a sharp pressure-like pain.  Occasionally will radiate into his back near his right scapula.  It lasts hours and resolve spontaneously.  Associated with it is nausea and bloating and burping.  He denies any fevers or chills.  He denies any melena or hematochezia.  Of note he is on high-dose Saxenda for weight loss which can certainly cause nausea and bloating.  He is also taking Celebrex for low back pain.  However he is on omeprazole 40 mg a day.  He denies any heartburn.  He denies any constipation or diarrhea. Past Medical History:  Diagnosis Date  . DDD (degenerative disc disease), lumbar   . Dyslipidemia   . GERD (gastroesophageal reflux disease)   . History of hiatal hernia   . History of kidney stones    H/O  . HLD (hyperlipidemia)   . Hypertension   . Obesity   . Prediabetes   . Sciatica of left side   . Sleep apnea    AHI 34  . Tubular adenoma of colon    2019 (Dr. Benson Norway)   Past Surgical History:  Procedure Laterality Date  . AMPUTATION Left 05/16/2018   Procedure: Ray Resection Left Index Finger;  Surgeon: [name], MD;  Location: [place];  Service: Orthopedics;  Laterality: Left;  . COLONOSCOPY    . ESOPHAGOGASTRODUODENOSCOPY    . EXTRACORPOREAL SHOCK WAVE LITHOTRIPSY Left 07/24/2017   Procedure: EXTRACORPOREAL SHOCK WAVE LITHOTRIPSY (ESWL);  Surgeon: [name], MD;  Location: [facility];  Service: Urology;  Laterality: Left;  . EXTRACORPOREAL SHOCK WAVE LITHOTRIPSY Right  12/11/2017   Procedure: EXTRACORPOREAL SHOCK WAVE LITHOTRIPSY (ESWL);  Surgeon: [name], MD;  Location: [facility];  Service: Urology;  Laterality: Right;  . EYE SURGERY    . INGUINAL HERNIA REPAIR Right 08/08/2017   Procedure: LAPAROSCOPIC INGUINAL HERNIA;  Surgeon: [name], MD;  Location: [facility];  Service: General;  Laterality: Right;  . NERVE REPAIR Left 05/16/2018   Procedure: Cutaneous Nerve Repair;  Surgeon: [name], MD;  Location: [place];  Service: Orthopedics;  Laterality: Left;  . SKIN FULL THICKNESS GRAFT Left 05/16/2018   Procedure: Skin Graft Full Thickness;  Surgeon: [name], MD;  Location: [place];  Service: Orthopedics;  Laterality: Left;  . TENDON REPAIR Left 05/16/2018   Procedure: Extensor Tendon Repair;  Surgeon: [name], MD;  Location: [place];  Service: Orthopedics;  Laterality: Left;  . TONSILLECTOMY    . VASECTOMY     Current Outpatient Medications on File Prior to Visit  Medication Sig Dispense Refill  . aspirin EC 81 MG tablet Take 81 mg by mouth at bedtime.    Marland Kitchen atorvastatin (LIPITOR) 40 MG tablet Take 1 tablet (40 mg total) by mouth daily. 90 tablet 3  . celecoxib (CELEBREX) 200 MG capsule TAKE 1 CAPSULE DAILY 90 capsule 3  . escitalopram (LEXAPRO) 10 MG tablet TAKE 1 TABLET DAILY 90 tablet 3  . hydrochlorothiazide (  HYDRODIURIL) 25 MG tablet TAKE 1 TABLET DAILY (Patient taking differently: Take 25 mg by mouth daily. ) 90 tablet 3  . Insulin Pen Needle (RELION PEN NEEDLES) 32G X 4 MM MISC Use daily with Saxenda 100 each 3  . Liraglutide -Weight Management (SAXENDA) 18 MG/3ML SOPN Inject 3 mg into the skin daily. Inject 0.6 mg into the skin daily. Increase by 0.6mg  q week until max dose of 3mg  is reached. 5 pen 3  . losartan (COZAAR) 100 MG tablet TAKE 1 TABLET DAILY 90 tablet 3  . Melatonin 10 MG TABS Take 10 mg by mouth at bedtime.     . metFORMIN (GLUCOPHAGE) 500 MG tablet Take 2 tablets (1,000 mg total) by mouth 2 (two) times daily with a meal.  360 tablet 1  . Multiple Vitamins-Minerals (ONE-A-DAY MENS HEALTH FORMULA PO) Take 1 tablet by mouth daily.    . Omega-3 Krill Oil 500 MG CAPS Take 500 mg by mouth daily.     Marland Kitchen omeprazole (PRILOSEC) 40 MG capsule TAKE 1 CAPSULE DAILY 90 capsule 3   No current facility-administered medications on file prior to visit.   Allergies  Allergen Reactions  . Penicillins Anaphylaxis and Other (See Comments)    Has patient had a PCN reaction causing immediate rash, facial/tongue/throat swelling, SOB or lightheadedness with hypotension: Yes Has patient had a PCN reaction causing severe rash involving mucus membranes or skin necrosis: No Has patient had a PCN reaction that required hospitalization: Yes Has patient had a PCN reaction occurring within the last 10 years: No If all of the above answers are "NO", then may proceed with Cephalosporin use.   . Sulfa Antibiotics Swelling and Other (See Comments)    Childhood allergy- site of swelling not recalled  . Keflex [Cephalexin] Other (See Comments)    Childhood allergy- reaction not recalled   Social History   Socioeconomic History  . Marital status: Married    Spouse name: Not on file  . Number of children: 1  . Years of education: college  . Highest education level: Not on file  Occupational History  . Not on file  Tobacco Use  . Smoking status: Never Smoker  . Smokeless tobacco: Never Used  Substance and Sexual Activity  . Alcohol use: Yes    Comment: RARE  . Drug use: No  . Sexual activity: Yes  Other Topics Concern  . Not on file  Social History Narrative   Drinks about 1 cup of tea a day    Social Determinants of Health   Financial Resource Strain:   . Difficulty of Paying Living Expenses: Not on file  Food Insecurity:   . Worried About Charity fundraiser in the Last Year: Not on file  . Ran Out of Food in the Last Year: Not on file  Transportation Needs:   . Lack of Transportation (Medical): Not on file  . Lack of  Transportation (Non-Medical): Not on file  Physical Activity:   . Days of Exercise per Week: Not on file  . Minutes of Exercise per Session: Not on file  Stress:   . Feeling of Stress : Not on file  Social Connections:   . Frequency of Communication with Friends and Family: Not on file  . Frequency of Social Gatherings with Friends and Family: Not on file  . Attends Religious Services: Not on file  . Active Member of Clubs or Organizations: Not on file  . Attends Archivist Meetings: Not on file  .  Marital Status: Not on file  Intimate Partner Violence:   . Fear of Current or Ex-Partner: Not on file  . Emotionally Abused: Not on file  . Physically Abused: Not on file  . Sexually Abused: Not on file      Review of Systems  All other systems reviewed and are negative.      Objective:   Physical Exam Vitals reviewed.  Constitutional:      Appearance: Normal appearance. He is obese.  Cardiovascular:     Rate and Rhythm: Normal rate and regular rhythm.     Heart sounds: Normal heart sounds.  Pulmonary:     Effort: Pulmonary effort is normal. No respiratory distress.     Breath sounds: Normal breath sounds. No wheezing, rhonchi or rales.  Abdominal:     General: Abdomen is flat. Bowel sounds are normal. There is no distension.     Palpations: Abdomen is soft.     Tenderness: There is no abdominal tenderness. There is no guarding.    Neurological:     Mental Status: He is alert.           Assessment & Plan:  RUQ pain - Plan: CBC with Differential/Platelet, COMPLETE METABOLIC PANEL WITH GFR, Lipase, US Abdomen Limited RUQ  I have asked the patient to discontinue Celebrex immediately in case this is NSAID induced gastritis.  Continue omeprazole.  I also asked patient to discontinue Saxenda temporarily in case this could be nausea related to gastroparesis made worse by Saxenda.  However I suspect more likely this is biliary colic versus biliary dyskinesia.  I  will check a CBC, CMP, lipase and schedule the patient for right upper quadrant ultrasound.  Await the results of the ultrasound.  I did give him Norco to be used sparingly for pain however his pain is intense and severe or associated with fever he is directed to go to the emergency room at least until we have the results of his ultrasound.

## 2019-02-12 ENCOUNTER — Encounter: Payer: Self-pay | Admitting: Family Medicine

## 2019-02-12 LAB — CBC WITH DIFFERENTIAL/PLATELET
Absolute Monocytes: 606 cells/uL (ref 200–950)
Basophils Absolute: 116 cells/uL (ref 0–200)
Basophils Relative: 0.9 %
Eosinophils Absolute: 1587 cells/uL — ABNORMAL HIGH (ref 15–500)
Eosinophils Relative: 12.3 %
HCT: 36.9 % — ABNORMAL LOW (ref 38.5–50.0)
Hemoglobin: 12.1 g/dL — ABNORMAL LOW (ref 13.2–17.1)
Lymphs Abs: 2322 cells/uL (ref 850–3900)
MCH: 25.2 pg — ABNORMAL LOW (ref 27.0–33.0)
MCHC: 32.8 g/dL (ref 32.0–36.0)
MCV: 76.9 fL — ABNORMAL LOW (ref 80.0–100.0)
MPV: 11.1 fL (ref 7.5–12.5)
Monocytes Relative: 4.7 %
Neutro Abs: 8269 cells/uL — ABNORMAL HIGH (ref 1500–7800)
Neutrophils Relative %: 64.1 %
Platelets: 407 10*3/uL — ABNORMAL HIGH (ref 140–400)
RBC: 4.8 10*6/uL (ref 4.20–5.80)
RDW: 16.1 % — ABNORMAL HIGH (ref 11.0–15.0)
Total Lymphocyte: 18 %
WBC: 12.9 10*3/uL — ABNORMAL HIGH (ref 3.8–10.8)

## 2019-02-12 LAB — COMPLETE METABOLIC PANEL WITH GFR
AG Ratio: 1.9 (calc) (ref 1.0–2.5)
ALT: 19 U/L (ref 9–46)
AST: 13 U/L (ref 10–35)
Albumin: 4.3 g/dL (ref 3.6–5.1)
Alkaline phosphatase (APISO): 94 U/L (ref 35–144)
BUN: 17 mg/dL (ref 7–25)
CO2: 25 mmol/L (ref 20–32)
Calcium: 9.8 mg/dL (ref 8.6–10.3)
Chloride: 104 mmol/L (ref 98–110)
Creat: 0.8 mg/dL (ref 0.70–1.33)
GFR, Est African American: 119 mL/min/{1.73_m2} (ref >=60)
GFR, Est Non African American: 103 mL/min/{1.73_m2} (ref >=60)
Globulin: 2.3 g/dL (calc) (ref 1.9–3.7)
Glucose, Bld: 129 mg/dL — ABNORMAL HIGH (ref 65–99)
Potassium: 3.9 mmol/L (ref 3.5–5.3)
Sodium: 142 mmol/L (ref 135–146)
Total Bilirubin: 0.3 mg/dL (ref 0.2–1.2)
Total Protein: 6.6 g/dL (ref 6.1–8.1)

## 2019-02-12 LAB — LIPASE: Lipase: 41 U/L (ref 7–60)

## 2019-02-14 ENCOUNTER — Encounter: Payer: Self-pay | Admitting: Family Medicine

## 2019-02-19 ENCOUNTER — Other Ambulatory Visit

## 2019-02-23 ENCOUNTER — Ambulatory Visit
Admission: RE | Admit: 2019-02-23 | Discharge: 2019-02-23 | Disposition: A | Discharge status: Home / Self Care | Source: Ambulatory Visit | Attending: Family Medicine | Admitting: Family Medicine

## 2019-02-23 DIAGNOSIS — R1011 Right upper quadrant pain: Secondary | ICD-10-CM

## 2019-03-08 ENCOUNTER — Other Ambulatory Visit: Payer: Self-pay | Admitting: Family Medicine

## 2019-03-08 ENCOUNTER — Encounter: Payer: Self-pay | Admitting: Family Medicine

## 2019-03-08 DIAGNOSIS — E785 Hyperlipidemia, unspecified: Secondary | ICD-10-CM

## 2019-03-08 DIAGNOSIS — I10 Essential (primary) hypertension: Secondary | ICD-10-CM

## 2019-03-08 DIAGNOSIS — R7303 Prediabetes: Secondary | ICD-10-CM

## 2019-03-10 ENCOUNTER — Other Ambulatory Visit

## 2019-03-10 ENCOUNTER — Other Ambulatory Visit: Payer: Self-pay

## 2019-03-10 DIAGNOSIS — I10 Essential (primary) hypertension: Secondary | ICD-10-CM

## 2019-03-10 DIAGNOSIS — R7303 Prediabetes: Secondary | ICD-10-CM

## 2019-03-10 DIAGNOSIS — E66812 Obesity, class 2: Secondary | ICD-10-CM

## 2019-03-10 DIAGNOSIS — E785 Hyperlipidemia, unspecified: Secondary | ICD-10-CM

## 2019-03-11 ENCOUNTER — Ambulatory Visit (INDEPENDENT_AMBULATORY_CARE_PROVIDER_SITE_OTHER): Admitting: Family Medicine

## 2019-03-11 ENCOUNTER — Encounter: Payer: Self-pay | Admitting: Family Medicine

## 2019-03-11 VITALS — BP 112/68 | HR 86 | Temp 97.1°F | Resp 16 | Ht 69.0 in | Wt 281.0 lb

## 2019-03-11 DIAGNOSIS — D509 Iron deficiency anemia, unspecified: Secondary | ICD-10-CM

## 2019-03-11 DIAGNOSIS — E785 Hyperlipidemia, unspecified: Secondary | ICD-10-CM | POA: Diagnosis not present

## 2019-03-11 DIAGNOSIS — I10 Essential (primary) hypertension: Secondary | ICD-10-CM | POA: Diagnosis not present

## 2019-03-11 DIAGNOSIS — E118 Type 2 diabetes mellitus with unspecified complications: Secondary | ICD-10-CM

## 2019-03-11 LAB — HEMOGLOBIN A1C
Hgb A1c MFr Bld: 6.5 % of total Hgb — ABNORMAL HIGH (ref <5.7)
Mean Plasma Glucose: 140 (calc)
eAG (mmol/L): 7.7 (calc)

## 2019-03-11 LAB — CBC WITH DIFFERENTIAL/PLATELET
Absolute Monocytes: 488 cells/uL (ref 200–950)
Basophils Absolute: 55 cells/uL (ref 0–200)
Basophils Relative: 0.6 %
Eosinophils Absolute: 377 cells/uL (ref 15–500)
Eosinophils Relative: 4.1 %
HCT: 37.3 % — ABNORMAL LOW (ref 38.5–50.0)
Hemoglobin: 11.9 g/dL — ABNORMAL LOW (ref 13.2–17.1)
Lymphs Abs: 1822 cells/uL (ref 850–3900)
MCH: 24.5 pg — ABNORMAL LOW (ref 27.0–33.0)
MCHC: 31.9 g/dL — ABNORMAL LOW (ref 32.0–36.0)
MCV: 76.7 fL — ABNORMAL LOW (ref 80.0–100.0)
MPV: 10.8 fL (ref 7.5–12.5)
Monocytes Relative: 5.3 %
Neutro Abs: 6458 cells/uL (ref 1500–7800)
Neutrophils Relative %: 70.2 %
Platelets: 399 10*3/uL (ref 140–400)
RBC: 4.86 10*6/uL (ref 4.20–5.80)
RDW: 15.7 % — ABNORMAL HIGH (ref 11.0–15.0)
Total Lymphocyte: 19.8 %
WBC: 9.2 10*3/uL (ref 3.8–10.8)

## 2019-03-11 LAB — COMPREHENSIVE METABOLIC PANEL
AG Ratio: 1.9 (calc) (ref 1.0–2.5)
ALT: 23 U/L (ref 9–46)
AST: 14 U/L (ref 10–35)
Albumin: 4.3 g/dL (ref 3.6–5.1)
Alkaline phosphatase (APISO): 86 U/L (ref 35–144)
BUN: 17 mg/dL (ref 7–25)
CO2: 28 mmol/L (ref 20–32)
Calcium: 9.6 mg/dL (ref 8.6–10.3)
Chloride: 102 mmol/L (ref 98–110)
Creat: 0.83 mg/dL (ref 0.70–1.33)
Globulin: 2.3 g/dL (calc) (ref 1.9–3.7)
Glucose, Bld: 99 mg/dL (ref 65–99)
Potassium: 4.2 mmol/L (ref 3.5–5.3)
Sodium: 140 mmol/L (ref 135–146)
Total Bilirubin: 0.3 mg/dL (ref 0.2–1.2)
Total Protein: 6.6 g/dL (ref 6.1–8.1)

## 2019-03-11 LAB — LIPID PANEL
Cholesterol: 94 mg/dL (ref <200)
HDL: 29 mg/dL — ABNORMAL LOW (ref >=40)
LDL Cholesterol (Calc): 49 mg/dL (calc)
Non-HDL Cholesterol (Calc): 65 mg/dL (calc) (ref <130)
Total CHOL/HDL Ratio: 3.2 (calc) (ref <5.0)
Triglycerides: 76 mg/dL (ref <150)

## 2019-03-11 NOTE — Progress Notes (Signed)
Subjective:     Patient ID: Zachary Gillespie, male   DOB: 1965-12-29, 54 y.o.   MRN: [id number]  HPI  Appointment on 03/10/2019  Component Date Value Ref Range Status  . Cholesterol 03/10/2019 94  <200 mg/dL Final  . HDL 03/10/2019 29* > OR = 40 mg/dL Final  . Triglycerides 03/10/2019 76  <150 mg/dL Final  . LDL Cholesterol (Calc) 03/10/2019 49  mg/dL (calc) Final   Comment: Reference range: <100 . Desirable range <100 mg/dL for primary prevention;   <70 mg/dL for patients with CHD or diabetic patients  with > or = 2 CHD risk factors. Marland Kitchen LDL-C is now calculated using the Martin-Hopkins  calculation, which is a validated novel method providing  better accuracy than the Friedewald equation in the  estimation of LDL-C.  Cresenciano Genre et al. Annamaria Helling. [id number]): 2061-2068  (http://education.[facility].com/faq/FAQ164)   . Total CHOL/HDL Ratio 03/10/2019 3.2  <5.0 (calc) Final  . Non-HDL Cholesterol (Calc) 03/10/2019 65  <130 mg/dL (calc) Final   Comment: For patients with diabetes plus 1 major ASCVD risk  factor, treating to a non-HDL-C goal of <100 mg/dL  (LDL-C of <70 mg/dL) is considered a therapeutic  option.   . Hgb A1c MFr Bld 03/10/2019 6.5* <5.7 % of total Hgb Final   Comment: For someone without known diabetes, a hemoglobin A1c value of 6.5% or greater indicates that they may have  diabetes and this should be confirmed with a follow-up  test. . For someone with known diabetes, a value <7% indicates  that their diabetes is well controlled and a value  greater than or equal to 7% indicates suboptimal  control. A1c targets should be individualized based on  duration of diabetes, age, comorbid conditions, and  other considerations. . Currently, no consensus exists regarding use of hemoglobin A1c for diagnosis of diabetes for children. .   . Mean Plasma Glucose 03/10/2019 140  (calc) Final  . eAG (mmol/L) 03/10/2019 7.7  (calc) Final  . Glucose, Bld 03/10/2019 99  65  - 99 mg/dL Final   Comment: .            Fasting reference interval .   . BUN 03/10/2019 17  7 - 25 mg/dL Final  . Creat 03/10/2019 0.83  0.70 - 1.33 mg/dL Final   Comment: For patients >3 years of age, the reference limit for Creatinine is approximately 13% higher for people identified as African-American. .   [name] Ratio [id number] NOT APPLICABLE  6 - 22 (calc) Final  . Sodium 03/10/2019 140  135 - 146 mmol/L Final  . Potassium 03/10/2019 4.2  3.5 - 5.3 mmol/L Final  . Chloride 03/10/2019 102  98 - 110 mmol/L Final  . CO2 03/10/2019 28  20 - 32 mmol/L Final  . Calcium 03/10/2019 9.6  8.6 - 10.3 mg/dL Final  . Total Protein 03/10/2019 6.6  6.1 - 8.1 g/dL Final  . Albumin 03/10/2019 4.3  3.6 - 5.1 g/dL Final  . Globulin 03/10/2019 2.3  1.9 - 3.7 g/dL (calc) Final  . AG Ratio 03/10/2019 1.9  1.0 - 2.5 (calc) Final  . Total Bilirubin 03/10/2019 0.3  0.2 - 1.2 mg/dL Final  . Alkaline phosphatase (APISO) 03/10/2019 86  35 - 144 U/L Final  . AST 03/10/2019 14  10 - 35 U/L Final  . ALT 03/10/2019 23  9 - 46 U/L Final  . WBC 03/10/2019 9.2  3.8 - 10.8 Thousand/uL Final  . RBC 03/10/2019 4.86  4.20 - 5.80  Million/uL Final  . Hemoglobin 03/10/2019 11.9* 13.2 - 17.1 g/dL Final  . HCT 03/10/2019 37.3* 38.5 - 50.0 % Final  . MCV 03/10/2019 76.7* 80.0 - 100.0 fL Final  . MCH 03/10/2019 24.5* 27.0 - 33.0 pg Final  . MCHC 03/10/2019 31.9* 32.0 - 36.0 g/dL Final  . RDW 03/10/2019 15.7* 11.0 - 15.0 % Final  . Platelets 03/10/2019 399  140 - 400 Thousand/uL Final  . MPV 03/10/2019 10.8  7.5 - 12.5 fL Final  . Neutro Abs 03/10/2019 6,458  1,500 - 7,800 cells/uL Final  . Lymphs Abs 03/10/2019 1,822  850 - 3,900 cells/uL Final  . Absolute Monocytes 03/10/2019 488  200 - 950 cells/uL Final  . Eosinophils Absolute 03/10/2019 377  15 - 500 cells/uL Final  . Basophils Absolute 03/10/2019 55  0 - 200 cells/uL Final  . Neutrophils Relative % 03/10/2019 70.2  % Final  . Total Lymphocyte  03/10/2019 19.8  % Final  . Monocytes Relative 03/10/2019 5.3  % Final  . Eosinophils Relative 03/10/2019 4.1  % Final  . Basophils Relative 03/10/2019 0.6  % Final   Patient is here today to recheck his diabetes.  He is currently on Metformin.  He also uses Saxenda for weight loss.  His most recent hemoglobin A1c is well controlled at 6.5.  His most recent LDL cholesterol is outstanding and less than 100.  His renal function and his liver function test are normal.  He denies any chest pain shortness of breath or dyspnea on exertion.  His previous abdominal pain has resolved.  His right upper quadrant ultrasound was normal.  However in the exact same time, the patient has had a slight decline in his hemoglobin.  His hemoglobin was typically between 13 and 14 but it is dropped to 11.9.  It is also a microcytic pattern.  He denies any blood in his stool.  He denies any melena or hematochezia.  Blood pressure today is well controlled at 112/68. Past Medical History:  Diagnosis Date  . DDD (degenerative disc disease), lumbar   . Dyslipidemia   . GERD (gastroesophageal reflux disease)   . History of hiatal hernia   . History of kidney stones    H/O  . HLD (hyperlipidemia)   . Hypertension   . Obesity   . Prediabetes   . Sciatica of left side   . Sleep apnea    AHI 34  . Tubular adenoma of colon    2019 (Dr. Benson Norway)   Past Surgical History:  Procedure Laterality Date  . 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 COLONOSCOPY    . ESOPHAGOGASTRODUODENOSCOPY    . EXTRACORPOREAL SHOCK WAVE LITHOTRIPSY Left 07/24/2017   Procedure: EXTRACORPOREAL SHOCK WAVE LITHOTRIPSY (ESWL);  Surgeon: [name], MD;  Location: [facility];  Service: Urology;  Laterality: Left;  . EXTRACORPOREAL SHOCK WAVE LITHOTRIPSY Right 12/11/2017   Procedure: EXTRACORPOREAL SHOCK WAVE LITHOTRIPSY (ESWL);   Surgeon: [name], MD;  Location: [facility];  Service: Urology;  Laterality: Right;  . EYE SURGERY    . INGUINAL HERNIA REPAIR Right 08/08/2017   Procedure: LAPAROSCOPIC INGUINAL HERNIA;  Surgeon: [name], MD;  Location: [facility];  Service: General;  Laterality: Right;  . NERVE REPAIR Left 05/16/2018   Procedure: Cutaneous Nerve Repair;  Surgeon: [name], MD;  Location: [place];  Service: Orthopedics;  Laterality: Left;  .  SKIN FULL THICKNESS GRAFT Left 05/16/2018   Procedure: Skin Graft Full Thickness;  Surgeon: [name], MD;  Location: [place];  Service: Orthopedics;  Laterality: Left;  . TENDON REPAIR Left 05/16/2018   Procedure: Extensor Tendon Repair;  Surgeon: [name], MD;  Location: [place];  Service: Orthopedics;  Laterality: Left;  . TONSILLECTOMY    . VASECTOMY     Current Outpatient Medications on File Prior to Visit  Medication Sig Dispense Refill  . aspirin EC 81 MG tablet Take 81 mg by mouth at bedtime.    Marland Kitchen atorvastatin (LIPITOR) 40 MG tablet Take 1 tablet (40 mg total) by mouth daily. 90 tablet 3  . celecoxib (CELEBREX) 200 MG capsule TAKE 1 CAPSULE DAILY 90 capsule 3  . escitalopram (LEXAPRO) 10 MG tablet TAKE 1 TABLET DAILY 90 tablet 3  . fluticasone (FLONASE) 50 MCG/ACT nasal spray     . hydrochlorothiazide (HYDRODIURIL) 25 MG tablet TAKE 1 TABLET DAILY (Patient taking differently: Take 25 mg by mouth daily. ) 90 tablet 3  . Insulin Pen Needle (RELION PEN NEEDLES) 32G X 4 MM MISC Use daily with Saxenda 100 each 3  . Liraglutide -Weight Management (SAXENDA) 18 MG/3ML SOPN Inject 3 mg into the skin daily. Inject 0.6 mg into the skin daily. Increase by 0.6mg  q week until max dose of 3mg  is reached. 5 pen 3  . losartan (COZAAR) 100 MG tablet TAKE 1 TABLET DAILY 90 tablet 3  . Melatonin 10 MG TABS Take 10 mg by mouth at bedtime.     . metFORMIN (GLUCOPHAGE) 500 MG tablet Take 2 tablets (1,000 mg total) by mouth 2 (two) times daily with a meal. 360 tablet 1  .  Multiple Vitamins-Minerals (ONE-A-DAY MENS HEALTH FORMULA PO) Take 1 tablet by mouth daily.    . Omega-3 Krill Oil 500 MG CAPS Take 500 mg by mouth daily.     Marland Kitchen omeprazole (PRILOSEC) 40 MG capsule TAKE 1 CAPSULE DAILY 90 capsule 3   No current facility-administered medications on file prior to visit.   Allergies  Allergen Reactions  . Penicillins Anaphylaxis and Other (See Comments)    Has patient had a PCN reaction causing immediate rash, facial/tongue/throat swelling, SOB or lightheadedness with hypotension: Yes Has patient had a PCN reaction causing severe rash involving mucus membranes or skin necrosis: No Has patient had a PCN reaction that required hospitalization: Yes Has patient had a PCN reaction occurring within the last 10 years: No If all of the above answers are "NO", then may proceed with Cephalosporin use.   . Sulfa Antibiotics Swelling and Other (See Comments)    Childhood allergy- site of swelling not recalled  . Keflex [Cephalexin] Other (See Comments)    Childhood allergy- reaction not recalled   Social History   Socioeconomic History  . Marital status: Married    Spouse name: Not on file  . Number of children: 1  . Years of education: college  . Highest education level: Not on file  Occupational History  . Not on file  Tobacco Use  . Smoking status: Never Smoker  . Smokeless tobacco: Never Used  Substance and Sexual Activity  . Alcohol use: Yes    Comment: RARE  . Drug use: No  . Sexual activity: Yes  Other Topics Concern  . Not on file  Social History Narrative   Drinks about 1 cup of tea a day    Social Determinants of Health   Financial Resource Strain:   .  Difficulty of Paying Living Expenses: Not on file  Food Insecurity:   . Worried About Charity fundraiser in the Last Year: Not on file  . Ran Out of Food in the Last Year: Not on file  Transportation Needs:   . Lack of Transportation (Medical): Not on file  . Lack of Transportation  (Non-Medical): Not on file  Physical Activity:   . Days of Exercise per Week: Not on file  . Minutes of Exercise per Session: Not on file  Stress:   . Feeling of Stress : Not on file  Social Connections:   . Frequency of Communication with Friends and Family: Not on file  . Frequency of Social Gatherings with Friends and Family: Not on file  . Attends Religious Services: Not on file  . Active Member of Clubs or Organizations: Not on file  . Attends Archivist Meetings: Not on file  . Marital Status: Not on file  Intimate Partner Violence:   . Fear of Current or Ex-Partner: Not on file  . Emotionally Abused: Not on file  . Physically Abused: Not on file  . Sexually Abused: Not on file     Review of Systems  All other systems reviewed and are negative.      Objective:   Physical Exam Vitals reviewed.  Constitutional:      Appearance: He is well-developed.  Cardiovascular:     Rate and Rhythm: Normal rate and regular rhythm.     Heart sounds: Normal heart sounds.  Pulmonary:     Effort: Pulmonary effort is normal. No respiratory distress.     Breath sounds: Normal breath sounds. No stridor. No wheezing or rales.  Abdominal:     General: Bowel sounds are normal. There is no distension.     Palpations: Abdomen is soft. There is no mass.     Tenderness: There is no abdominal tenderness. There is no guarding or rebound.        Assessment:    Microcytic anemia - Plan: Fecal Globin By Immunochemistry  Dyslipidemia  Essential hypertension  Morbid obesity ([place])  Controlled type 2 diabetes mellitus with complication, without long-term current use of insulin ([place])    Plan:     Blood pressure today is well controlled.  I will make no changes in his diet antihypertensives.  Cholesterol is also well controlled.  I did recommend 30 minutes a day 5 days a week of aerobic exercise to help improve his HDL cholesterol.  His hemoglobin A1c is well controlled and  therefore I will continue his current dose of Metformin in addition to the Saxenda.  However I am concerned about the drop in his hemoglobin.  I will check a fecal occult blood test.  If his fit test is positive, I would recommend a GI consultation to evaluate for an occult blood loss.  I also recommended an over-the-counter iron supplement given the microcytic pattern.

## 2019-03-12 ENCOUNTER — Ambulatory Visit: Admitting: Family Medicine

## 2019-03-13 LAB — FECAL GLOBIN BY IMMUNOCHEMISTRY
FECAL GLOBIN RESULT:: NOT DETECTED
MICRO NUMBER:: 10220622
SPECIMEN QUALITY:: ADEQUATE

## 2019-03-20 ENCOUNTER — Other Ambulatory Visit: Payer: Self-pay | Admitting: Family Medicine

## 2019-03-26 ENCOUNTER — Encounter: Payer: Self-pay | Admitting: Family Medicine

## 2019-04-05 ENCOUNTER — Encounter: Payer: Self-pay | Admitting: Family Medicine

## 2019-04-06 ENCOUNTER — Encounter: Payer: Self-pay | Admitting: Family Medicine

## 2019-04-24 ENCOUNTER — Other Ambulatory Visit: Payer: Self-pay | Admitting: Family Medicine

## 2019-04-24 DIAGNOSIS — I10 Essential (primary) hypertension: Secondary | ICD-10-CM

## 2019-04-26 ENCOUNTER — Encounter: Payer: Self-pay | Admitting: Family Medicine

## 2019-05-03 ENCOUNTER — Other Ambulatory Visit: Payer: Self-pay | Admitting: Family Medicine

## 2019-05-04 ENCOUNTER — Other Ambulatory Visit: Payer: Self-pay | Admitting: Family Medicine

## 2019-05-13 ENCOUNTER — Other Ambulatory Visit: Payer: Self-pay | Admitting: Family Medicine

## 2019-05-26 ENCOUNTER — Other Ambulatory Visit: Payer: Self-pay | Admitting: Family Medicine

## 2019-05-26 MED ORDER — SAXENDA 18 MG/3ML ~~LOC~~ SOPN: 3.0000 mg | PEN_INJECTOR | Freq: Every day | SUBCUTANEOUS | 3 refills | Status: DC

## 2019-05-27 ENCOUNTER — Telehealth: Payer: Self-pay | Admitting: *Deleted

## 2019-05-27 NOTE — Telephone Encounter (Signed)
"  [employer] is processing your inquiry and will respond shortly with next steps. You are currently using the fastest method to process this request. Please do not fax or call [employer] to resubmit this request. To check for an update later, open this request again from your dashboard.    If you need assistance, please chat with CoverMyMeds or call us at [phone]."

## 2019-05-27 NOTE — Telephone Encounter (Signed)
Received request from pharmacy for PA on   PA submitted.   Dx:E66.09- obesity.

## 2019-05-27 NOTE — Telephone Encounter (Signed)
[employer] is reviewing your PA request and will respond within 24 hours for Medicaid or up to 72 hours for non-Medicaid plans, based on the required timeframe determined by state or federal regulations. To check for an update later, open this request from your dashboard.

## 2019-05-28 MED ORDER — SAXENDA 18 MG/3ML ~~LOC~~ SOPN: 3.0000 mg | PEN_INJECTOR | Freq: Every day | SUBCUTANEOUS | 3 refills | Status: DC

## 2019-05-28 NOTE — Telephone Encounter (Signed)
Received PA determination.   PA Case ID [id number] approved 04/27/2019- 05/26/2020.  Pharmacy made aware.

## 2019-06-25 ENCOUNTER — Other Ambulatory Visit: Payer: Self-pay | Admitting: Family Medicine

## 2019-07-13 IMAGING — CR LEFT HAND - 2 VIEW
2 series · 2 of 2 positions shown · non-contrast
Comparison: None.

CLINICAL DATA: Traumatic amputation

EXAM:
LEFT HAND - 2 VIEW

[hand pa]
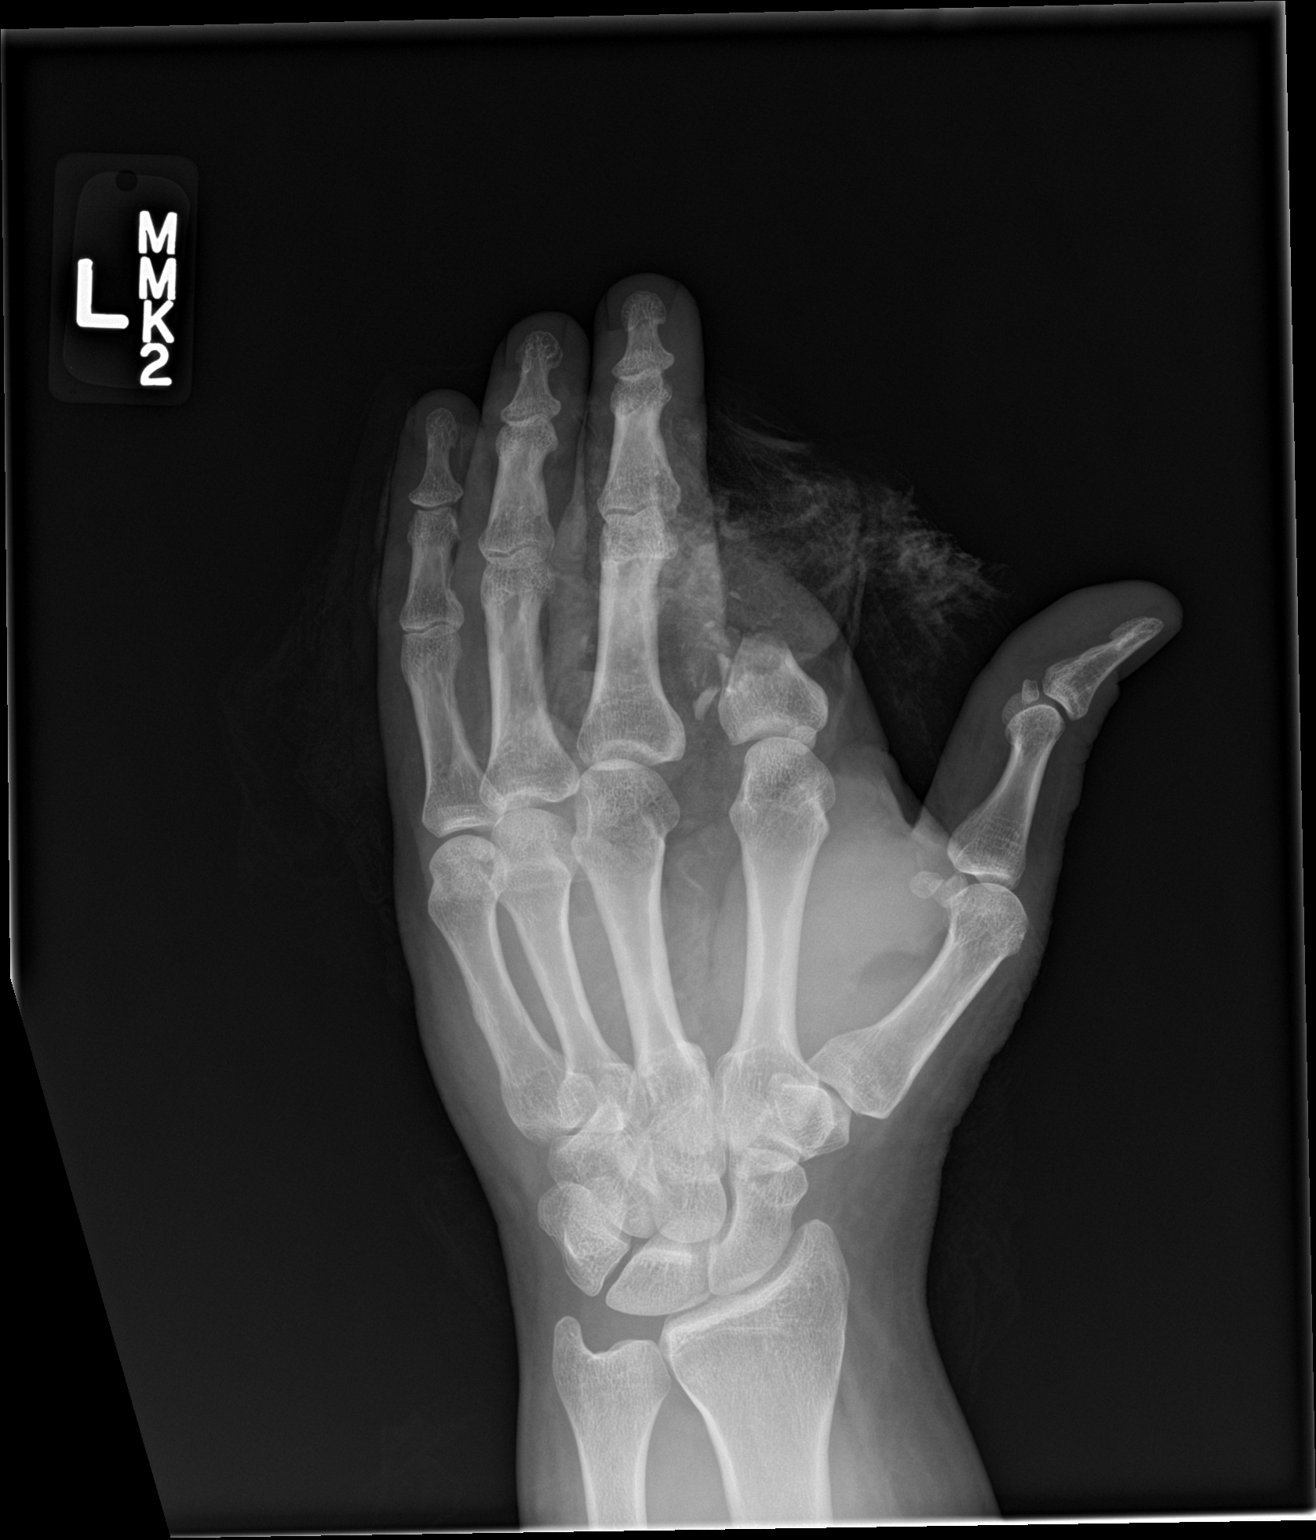

[hand lat]
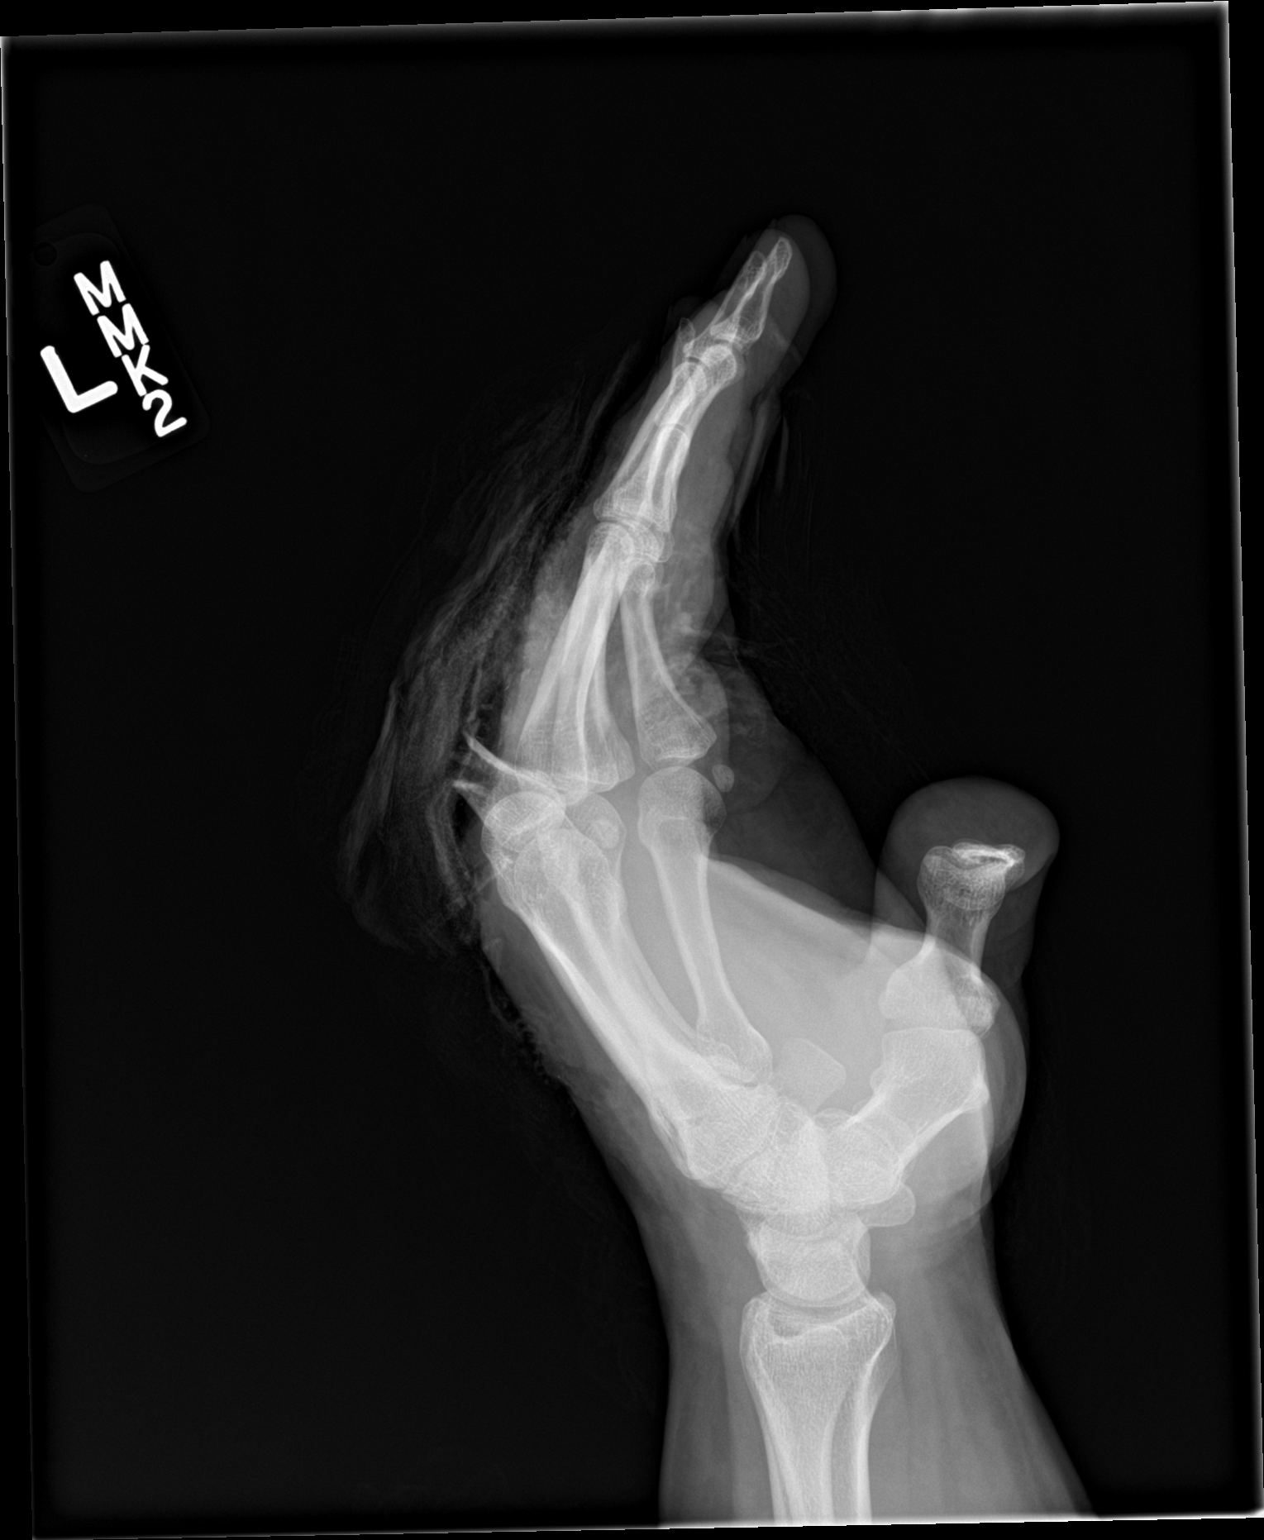

[2 of 2 positions shown; findings below may reference images not displayed]

FINDINGS: There is trans proximal phalangeal amputation near the base of the
left index finger, with comminuted fracture fragments about the
amputation stump. No radiopaque foreign bodies are identified. No
other evidence of fracture or dislocation. Joint spaces are
preserved.
IMPRESSION: There is trans proximal phalangeal amputation near the base of the
left index finger, with comminuted fracture fragments about the
amputation stump. No radiopaque foreign bodies are identified. No
other evidence of fracture or dislocation. Joint spaces are
preserved.

## 2019-07-16 ENCOUNTER — Encounter: Payer: Self-pay | Admitting: Family Medicine

## 2019-07-19 ENCOUNTER — Other Ambulatory Visit: Payer: Self-pay | Admitting: *Deleted

## 2019-07-19 DIAGNOSIS — H9193 Unspecified hearing loss, bilateral: Secondary | ICD-10-CM

## 2019-07-20 ENCOUNTER — Other Ambulatory Visit: Payer: Self-pay | Admitting: Family Medicine

## 2019-07-22 ENCOUNTER — Ambulatory Visit
Admission: RE | Admit: 2019-07-22 | Discharge: 2019-07-22 | Disposition: A | Discharge status: Home / Self Care | Source: Ambulatory Visit | Attending: Family Medicine | Admitting: Family Medicine

## 2019-07-22 ENCOUNTER — Other Ambulatory Visit: Payer: Self-pay

## 2019-07-22 ENCOUNTER — Ambulatory Visit (INDEPENDENT_AMBULATORY_CARE_PROVIDER_SITE_OTHER): Admitting: Family Medicine

## 2019-07-22 VITALS — BP 110/70 | HR 82 | Temp 98.0°F | Ht 69.0 in | Wt 281.0 lb

## 2019-07-22 DIAGNOSIS — E118 Type 2 diabetes mellitus with unspecified complications: Secondary | ICD-10-CM | POA: Diagnosis not present

## 2019-07-22 DIAGNOSIS — M5440 Lumbago with sciatica, unspecified side: Secondary | ICD-10-CM

## 2019-07-22 DIAGNOSIS — F321 Major depressive disorder, single episode, moderate: Secondary | ICD-10-CM | POA: Insufficient documentation

## 2019-07-22 NOTE — Progress Notes (Signed)
Subjective:    Patient ID: Zachary Gillespie, male    DOB: Oct 21, 1965, 54 y.o.   MRN: [id number]  HPI Patient is a very pleasant 54 year old Caucasian male who is here today complaining of low back pain.  He had an MRI in approximately 2015 which showed degenerative disc disease in the lumbar spine.  At that time there was mild impingement at L3-L4 but no serious nerve impingement.  Over the last year, the pain has intensified in his lower back.  He points to an area roughly around the level of L3 primarily on the right side.  He states that with prolonged standing, he will feel a pressure develop in his lower back just above his gluteus.  He pain radiate down into both hips.  He will have some numbness and tingling in his legs.  He denies any leg weakness.  He denies any saddle anesthesia.  Muscle strength is 5/5 equal and symmetric in both legs.  He has normal reflexes at the patella and at the Achilles bilaterally.  He denies any symptoms of cauda equina syndrome however he does report that prolonged standing or walking intensifies the pain and develops like a sharp pressure-like sensation that at times becomes unbearable and he needs to sit down. Past Medical History:  Diagnosis Date  . DDD (degenerative disc disease), lumbar   . Dyslipidemia   . GERD (gastroesophageal reflux disease)   . History of hiatal hernia   . History of kidney stones    H/O  . HLD (hyperlipidemia)   . Hypertension   . Obesity   . Prediabetes   . Sciatica of left side   . Sleep apnea    AHI 34  . Tubular adenoma of colon    2019 (Dr. Benson Norway)   Past Surgical History:  Procedure Laterality Date  . AMPUTATION Left 05/16/2018   Procedure: Ray Resection Left Index Finger;  Surgeon: [name], MD;  Location: [place];  Service: Orthopedics;  Laterality: Left;  . COLONOSCOPY    . ESOPHAGOGASTRODUODENOSCOPY    . EXTRACORPOREAL SHOCK WAVE LITHOTRIPSY Left 07/24/2017   Procedure: EXTRACORPOREAL SHOCK WAVE LITHOTRIPSY  (ESWL);  Surgeon: [name], MD;  Location: [facility];  Service: Urology;  Laterality: Left;  . EXTRACORPOREAL SHOCK WAVE LITHOTRIPSY Right 12/11/2017   Procedure: EXTRACORPOREAL SHOCK WAVE LITHOTRIPSY (ESWL);  Surgeon: [name], MD;  Location: [facility];  Service: Urology;  Laterality: Right;  . EYE SURGERY    . INGUINAL HERNIA REPAIR Right 08/08/2017   Procedure: LAPAROSCOPIC INGUINAL HERNIA;  Surgeon: [name], MD;  Location: [facility];  Service: General;  Laterality: Right;  . NERVE REPAIR Left 05/16/2018   Procedure: Cutaneous Nerve Repair;  Surgeon: [name], MD;  Location: [place];  Service: Orthopedics;  Laterality: Left;  . SKIN FULL THICKNESS GRAFT Left 05/16/2018   Procedure: Skin Graft Full Thickness;  Surgeon: [name], MD;  Location: [place];  Service: Orthopedics;  Laterality: Left;  . TENDON REPAIR Left 05/16/2018   Procedure: Extensor Tendon Repair;  Surgeon: [name], MD;  Location: [place];  Service: Orthopedics;  Laterality: Left;  . TONSILLECTOMY    . VASECTOMY     Current Outpatient Medications on File Prior to Visit  Medication Sig Dispense Refill  . aspirin EC 81 MG tablet Take 81 mg by mouth at bedtime.    Marland Kitchen atorvastatin (LIPITOR) 40 MG tablet Take 1 tablet (40 mg total) by mouth daily. 90 tablet 3  . BD PEN NEEDLE MICRO U/F  32G X 6 MM MISC USE TO INJECT SAXENDA UNDER THE SKIN DAILY AS DIRECTED 100 each 3  . celecoxib (CELEBREX) 200 MG capsule TAKE 1 CAPSULE DAILY 90 capsule 3  . escitalopram (LEXAPRO) 10 MG tablet TAKE 1 TABLET DAILY 90 tablet 3  . fluticasone (FLONASE) 50 MCG/ACT nasal spray USE 2 SPRAYS IN EACH NOSTRIL DAILY 48 g 3  . hydrochlorothiazide (HYDRODIURIL) 25 MG tablet TAKE 1 TABLET DAILY 90 tablet 3  . Insulin Pen Needle (RELION PEN NEEDLES) 32G X 4 MM MISC Use daily with Saxenda 100 each 3  . Liraglutide -Weight Management (SAXENDA) 18 MG/3ML SOPN Inject 0.5 mLs (3 mg total) into the skin daily. 5 pen 3  . losartan (COZAAR) 100 MG  tablet TAKE 1 TABLET DAILY 90 tablet 3  . Melatonin 10 MG TABS Take 10 mg by mouth at bedtime.     . metFORMIN (GLUCOPHAGE) 500 MG tablet TAKE 2 TABLETS TWICE A DAY WITH MEALS 360 tablet 3  . Multiple Vitamins-Minerals (ONE-A-DAY MENS HEALTH FORMULA PO) Take 1 tablet by mouth daily.    . Omega-3 Krill Oil 500 MG CAPS Take 500 mg by mouth daily.     Marland Kitchen omeprazole (PRILOSEC) 40 MG capsule TAKE 1 CAPSULE DAILY 90 capsule 3   No current facility-administered medications on file prior to visit.   Allergies  Allergen Reactions  . Penicillins Anaphylaxis and Other (See Comments)    Has patient had a PCN reaction causing immediate rash, facial/tongue/throat swelling, SOB or lightheadedness with hypotension: Yes Has patient had a PCN reaction causing severe rash involving mucus membranes or skin necrosis: No Has patient had a PCN reaction that required hospitalization: Yes Has patient had a PCN reaction occurring within the last 10 years: No If all of the above answers are "NO", then may proceed with Cephalosporin use.   . Sulfa Antibiotics Swelling and Other (See Comments)    Childhood allergy- site of swelling not recalled  . Keflex [Cephalexin] Other (See Comments)    Childhood allergy- reaction not recalled   Social History   Socioeconomic History  . Marital status: Married    Spouse name: Not on file  . Number of children: 1  . Years of education: college  . Highest education level: Not on file  Occupational History  . Not on file  Tobacco Use  . Smoking status: Never Smoker  . Smokeless tobacco: Never Used  Vaping Use  . Vaping Use: Never used  Substance and Sexual Activity  . Alcohol use: Yes    Comment: RARE  . Drug use: No  . Sexual activity: Yes  Other Topics Concern  . Not on file  Social History Narrative   Drinks about 1 cup of tea a day    Social Determinants of Health   Financial Resource Strain:   . Difficulty of Paying Living Expenses:   Food Insecurity:     . Worried About Charity fundraiser in the Last Year:   . Arboriculturist in the Last Year:   Transportation Needs:   . Film/video editor (Medical):   Marland Kitchen Lack of Transportation (Non-Medical):   Physical Activity:   . Days of Exercise per Week:   . Minutes of Exercise per Session:   Stress:   . Feeling of Stress :   Social Connections:   . Frequency of Communication with Friends and Family:   . Frequency of Social Gatherings with Friends and Family:   . Attends Religious Services:   .  Active Member of Clubs or Organizations:   . Attends Archivist Meetings:   Marland Kitchen Marital Status:   Intimate Partner Violence:   . Fear of Current or Ex-Partner:   . Emotionally Abused:   Marland Kitchen Physically Abused:   . Sexually Abused:       Review of Systems  Musculoskeletal: Positive for back pain.  All other systems reviewed and are negative.      Objective:   Physical Exam Vitals reviewed.  Constitutional:      Appearance: Normal appearance. He is obese.  Cardiovascular:     Rate and Rhythm: Normal rate and regular rhythm.     Heart sounds: Normal heart sounds.  Pulmonary:     Effort: Pulmonary effort is normal. No respiratory distress.     Breath sounds: Normal breath sounds. No wheezing, rhonchi or rales.  Abdominal:     General: Abdomen is flat. Bowel sounds are normal. There is no distension.     Palpations: Abdomen is soft.     Tenderness: There is no abdominal tenderness. There is no guarding.  Musculoskeletal:     Lumbar back: Spasms and tenderness present. No bony tenderness. Decreased range of motion. Negative right straight leg raise test and negative left straight leg raise test.  Neurological:     Mental Status: He is alert.           Assessment & Plan:  Midline low back pain with sciatica, sciatica laterality unspecified, unspecified chronicity - Plan: DG Lumbar Spine Complete  Controlled type 2 diabetes mellitus with complication, without long-term  current use of insulin ([facility]) - Plan: Hemoglobin A1c, COMPLETE METABOLIC PANEL WITH GFR, Lipid panel, Microalbumin, urine  I feel the patient most likely has worsening degenerative disc disease in his lumbar spine and has compensatory muscle stiffness and muscle spasms in his lower back try to compensate.  Per tickly his hip extensors and his gluteus muscles are extremely tight and range of motion in these muscles causes significant pain and stiffness.  I do believe the patient would benefit from physical therapy.  I will begin by obtaining an x-ray to rule out any significant pathology and if the x-ray is relatively normal I would recommend trying physical therapy.  If worsening, the patient may benefit from epidural steroid injections for degenerative disc disease however I would need to obtain an MRI first to evaluate for that.  While the patient is here we will get fasting lab work pertaining to his diabetes.

## 2019-07-23 ENCOUNTER — Telehealth: Payer: Self-pay

## 2019-07-23 ENCOUNTER — Other Ambulatory Visit: Payer: Self-pay

## 2019-07-23 DIAGNOSIS — M4807 Spinal stenosis, lumbosacral region: Secondary | ICD-10-CM

## 2019-07-23 LAB — HEMOGLOBIN A1C
Hgb A1c MFr Bld: 6.2 % of total Hgb — ABNORMAL HIGH (ref <5.7)
Mean Plasma Glucose: 131 (calc)
eAG (mmol/L): 7.3 (calc)

## 2019-07-23 LAB — LIPID PANEL
Cholesterol: 96 mg/dL (ref <200)
HDL: 34 mg/dL — ABNORMAL LOW (ref >=40)
LDL Cholesterol (Calc): 47 mg/dL (calc)
Non-HDL Cholesterol (Calc): 62 mg/dL (calc) (ref <130)
Total CHOL/HDL Ratio: 2.8 (calc) (ref <5.0)
Triglycerides: 74 mg/dL (ref <150)

## 2019-07-23 LAB — COMPLETE METABOLIC PANEL WITH GFR
AG Ratio: 2 (calc) (ref 1.0–2.5)
ALT: 28 U/L (ref 9–46)
AST: 17 U/L (ref 10–35)
Albumin: 4.3 g/dL (ref 3.6–5.1)
Alkaline phosphatase (APISO): 91 U/L (ref 35–144)
BUN/Creatinine Ratio: 12 (calc) (ref 6–22)
BUN: 21 mg/dL (ref 7–25)
CO2: 28 mmol/L (ref 20–32)
Calcium: 9.6 mg/dL (ref 8.6–10.3)
Chloride: 102 mmol/L (ref 98–110)
Creat: 1.72 mg/dL — ABNORMAL HIGH (ref 0.70–1.33)
GFR, Est African American: 52 mL/min/{1.73_m2} — ABNORMAL LOW (ref >=60)
GFR, Est Non African American: 45 mL/min/{1.73_m2} — ABNORMAL LOW (ref >=60)
Globulin: 2.2 g/dL (calc) (ref 1.9–3.7)
Glucose, Bld: 99 mg/dL (ref 65–99)
Potassium: 5.2 mmol/L (ref 3.5–5.3)
Sodium: 140 mmol/L (ref 135–146)
Total Bilirubin: 0.3 mg/dL (ref 0.2–1.2)
Total Protein: 6.5 g/dL (ref 6.1–8.1)

## 2019-07-23 LAB — MICROALBUMIN, URINE: Microalb, Ur: 1 mg/dL

## 2019-07-23 NOTE — Telephone Encounter (Signed)
Ok thanks 

## 2019-07-23 NOTE — Telephone Encounter (Signed)
Pt refused therapy, but is ok with MRI. MRI was orderded [facility]

## 2019-08-03 ENCOUNTER — Ambulatory Visit (HOSPITAL_COMMUNITY)
Admission: RE | Admit: 2019-08-03 | Discharge: 2019-08-03 | Disposition: A | Discharge status: Home / Self Care | Source: Ambulatory Visit | Attending: Family Medicine | Admitting: Family Medicine

## 2019-08-03 ENCOUNTER — Other Ambulatory Visit: Payer: Self-pay

## 2019-08-03 DIAGNOSIS — M4807 Spinal stenosis, lumbosacral region: Secondary | ICD-10-CM | POA: Insufficient documentation

## 2019-08-05 ENCOUNTER — Other Ambulatory Visit: Payer: Self-pay

## 2019-08-05 DIAGNOSIS — M4807 Spinal stenosis, lumbosacral region: Secondary | ICD-10-CM

## 2019-08-09 ENCOUNTER — Encounter: Payer: Self-pay | Admitting: Family Medicine

## 2019-08-09 ENCOUNTER — Ambulatory Visit (INDEPENDENT_AMBULATORY_CARE_PROVIDER_SITE_OTHER): Admitting: Family Medicine

## 2019-08-09 ENCOUNTER — Other Ambulatory Visit: Payer: Self-pay

## 2019-08-09 VITALS — BP 110/60 | HR 82 | Temp 96.0°F | Ht 69.0 in | Wt 281.0 lb

## 2019-08-09 DIAGNOSIS — N289 Disorder of kidney and ureter, unspecified: Secondary | ICD-10-CM | POA: Diagnosis not present

## 2019-08-09 DIAGNOSIS — M5136 Other intervertebral disc degeneration, lumbar region: Secondary | ICD-10-CM | POA: Diagnosis not present

## 2019-08-09 LAB — BASIC METABOLIC PANEL WITH GFR
BUN: 15 mg/dL (ref 7–25)
CO2: 28 mmol/L (ref 20–32)
Calcium: 9.9 mg/dL (ref 8.6–10.3)
Chloride: 101 mmol/L (ref 98–110)
Creat: 0.9 mg/dL (ref 0.70–1.33)
GFR, Est African American: 113 mL/min/{1.73_m2} (ref >=60)
GFR, Est Non African American: 98 mL/min/{1.73_m2} (ref >=60)
Glucose, Bld: 103 mg/dL — ABNORMAL HIGH (ref 65–99)
Potassium: 4.5 mmol/L (ref 3.5–5.3)
Sodium: 138 mmol/L (ref 135–146)

## 2019-08-09 NOTE — Progress Notes (Signed)
Subjective:    Patient ID: Zachary Gillespie, male    DOB: 1965/04/17, 54 y.o.   MRN: [id number]  Back Pain  Patient is here today for follow-up.  MRI revealed a right paracentral disc bulge at L4-L5 slightly indenting the ventral thecal sac.  He also had a bulging disc possibly abutting the right S1 nerve root at L5-S1.  Patient has been taking Celebrex daily for degenerative disc disease in his back and degenerative joint disease.  On his recent lab work, his creatinine had risen sharply from 0.8-1.7.  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  Therefore I had the patient discontinue Celebrex.  He is here today to recheck his renal function.  Microalbumin was normal at his last visit although we have not obtained a urinalysis Past Medical History:  Diagnosis Date  . DDD (degenerative disc disease), lumbar   . Dyslipidemia   . GERD (gastroesophageal reflux disease)   . History of hiatal hernia   . History of kidney stones    H/O  . HLD (hyperlipidemia)   . Hypertension   . Obesity   . Prediabetes   . Sciatica of left side   . Sleep apnea    AHI 34  . Tubular adenoma of colon    2019 (Dr. Benson Norway)   Past Surgical History:  Procedure Laterality Date  . AMPUTATION Left 05/16/2018   Procedure: Ray Resection Left Index Finger;  Surgeon: [name], MD;  Location: [facility];  Service: Orthopedics;  Laterality: Left;  . COLONOSCOPY    . ESOPHAGOGASTRODUODENOSCOPY    . EXTRACORPOREAL SHOCK WAVE LITHOTRIPSY Left 07/24/2017   Procedure: EXTRACORPOREAL SHOCK WAVE LITHOTRIPSY (ESWL);  Surgeon: [name], MD;  Location: [facility];  Service: Urology;  Laterality: Left;  . EXTRACORPOREAL SHOCK WAVE LITHOTRIPSY Right 12/11/2017   Procedure: EXTRACORPOREAL SHOCK WAVE LITHOTRIPSY (ESWL);  Surgeon: [name], MD;  Location: [facility];  Service: Urology;  Laterality: Right;  . EYE SURGERY    . INGUINAL HERNIA REPAIR Right 08/08/2017   Procedure: LAPAROSCOPIC INGUINAL HERNIA;  Surgeon: [name],  MD;  Location: [facility];  Service: General;  Laterality: Right;  . NERVE REPAIR Left 05/16/2018   Procedure: Cutaneous Nerve Repair;  Surgeon: [name], MD;  Location: [place];  Service: Orthopedics;  Laterality: Left;  . SKIN FULL THICKNESS GRAFT Left 05/16/2018   Procedure: Skin Graft Full Thickness;  Surgeon: [name], MD;  Location: [place];  Service: Orthopedics;  Laterality: Left;  . TENDON REPAIR Left 05/16/2018   Procedure: Extensor Tendon Repair;  Surgeon: [name], MD;  Location: [place];  Service: Orthopedics;  Laterality: Left;  . TONSILLECTOMY    . VASECTOMY     Current Outpatient Medications on File Prior to Visit  Medication Sig Dispense Refill  . aspirin EC 81 MG tablet Take 81 mg by mouth at bedtime.    Marland Kitchen atorvastatin (LIPITOR) 40 MG tablet Take 1 tablet (40 mg total) by mouth daily. 90 tablet 3  . BD PEN NEEDLE MICRO U/F 32G X 6 MM MISC USE TO INJECT SAXENDA UNDER THE SKIN DAILY AS DIRECTED 100 each 3  . celecoxib (CELEBREX) 200 MG capsule TAKE 1 CAPSULE DAILY 90 capsule 3  . escitalopram (LEXAPRO) 10 MG tablet TAKE 1 TABLET DAILY 90 tablet 3  . fluticasone (FLONASE) 50 MCG/ACT nasal spray USE 2 SPRAYS IN EACH NOSTRIL DAILY 48 g 3  . hydrochlorothiazide (HYDRODIURIL) 25 MG tablet TAKE 1 TABLET DAILY 90 tablet 3  . Insulin Pen Needle (RELION  PEN NEEDLES) 32G X 4 MM MISC Use daily with Saxenda 100 each 3  . Liraglutide -Weight Management (SAXENDA) 18 MG/3ML SOPN Inject 0.5 mLs (3 mg total) into the skin daily. 5 pen 3  . losartan (COZAAR) 100 MG tablet TAKE 1 TABLET DAILY 90 tablet 3  . Melatonin 10 MG TABS Take 10 mg by mouth at bedtime.     . metFORMIN (GLUCOPHAGE) 500 MG tablet TAKE 2 TABLETS TWICE A DAY WITH MEALS 360 tablet 3  . Multiple Vitamins-Minerals (ONE-A-DAY MENS HEALTH FORMULA PO) Take 1 tablet by mouth daily.    . Omega-3 Krill Oil 500 MG CAPS Take 500 mg by mouth daily.     Marland Kitchen omeprazole (PRILOSEC) 40 MG capsule TAKE 1 CAPSULE DAILY 90 capsule 3   No current  facility-administered medications on file prior to visit.   Allergies  Allergen Reactions  . Penicillins Anaphylaxis and Other (See Comments)    Has patient had a PCN reaction causing immediate rash, facial/tongue/throat swelling, SOB or lightheadedness with hypotension: Yes Has patient had a PCN reaction causing severe rash involving mucus membranes or skin necrosis: No Has patient had a PCN reaction that required hospitalization: Yes Has patient had a PCN reaction occurring within the last 10 years: No If all of the above answers are "NO", then may proceed with Cephalosporin use.   . Sulfa Antibiotics Swelling and Other (See Comments)    Childhood allergy- site of swelling not recalled  . Keflex [Cephalexin] Other (See Comments)    Childhood allergy- reaction not recalled   Social History   Socioeconomic History  . Marital status: Married    Spouse name: Not on file  . Number of children: 1  . Years of education: college  . Highest education level: Not on file  Occupational History  . Not on file  Tobacco Use  . Smoking status: Never Smoker  . Smokeless tobacco: Never Used  Vaping Use  . Vaping Use: Never used  Substance and Sexual Activity  . Alcohol use: Yes    Comment: RARE  . Drug use: No  . Sexual activity: Yes  Other Topics Concern  . Not on file  Social History Narrative   Drinks about 1 cup of tea a day    Social Determinants of Health   Financial Resource Strain:   . Difficulty of Paying Living Expenses:   Food Insecurity:   . Worried About Charity fundraiser in the Last Year:   . Arboriculturist in the Last Year:   Transportation Needs:   . Film/video editor (Medical):   Marland Kitchen Lack of Transportation (Non-Medical):   Physical Activity:   . Days of Exercise per Week:   . Minutes of Exercise per Session:   Stress:   . Feeling of Stress :   Social Connections:   . Frequency of Communication with Friends and Family:   . Frequency of Social Gatherings  with Friends and Family:   . Attends Religious Services:   . Active Member of Clubs or Organizations:   . Attends Archivist Meetings:   Marland Kitchen Marital Status:   Intimate Partner Violence:   . Fear of Current or Ex-Partner:   . Emotionally Abused:   Marland Kitchen Physically Abused:   . Sexually Abused:       Review of Systems  Musculoskeletal: Positive for back pain.  All other systems reviewed and are negative.      Objective:   Physical Exam Vitals reviewed.  Constitutional:      Appearance: Normal appearance. He is obese.  Cardiovascular:     Rate and Rhythm: Normal rate and regular rhythm.     Heart sounds: Normal heart sounds.  Pulmonary:     Effort: Pulmonary effort is normal. No respiratory distress.     Breath sounds: Normal breath sounds. No wheezing, rhonchi or rales.  Abdominal:     General: Abdomen is flat. Bowel sounds are normal. There is no distension.     Palpations: Abdomen is soft.     Tenderness: There is no abdominal tenderness. There is no guarding.  Musculoskeletal:     Lumbar back: Spasms and tenderness present. No bony tenderness. Decreased range of motion. Negative right straight leg raise test and negative left straight leg raise test.  Neurological:     Mental Status: He is alert.           Assessment & Plan:  Renal insufficiency - Plan: Urinalysis, Routine w reflex microscopic, BASIC METABOLIC PANEL WITH GFR  DDD (degenerative disc disease), lumbar   At the present time, we will manage the degenerative disc disease in his back conservatively.  Patient may be a candidate for epidural steroid injections if the pain worsens.  I did recommend weight loss.  However at the present time he is unable to take NSAIDs due to his renal insufficiency.  I will repeat a BMP today.  If BMP is normal, I will explain this is NSAID induced nephritis.  If renal function is worse, we will need to obtain an SPEP, urinalysis, renal ultrasound however MRI of the lower  back recently showed a benign 3.8 cm left simple cyst but was otherwise normal.  If renal function remains impaired, we may need to discontinue hydrochlorothiazide as well as Metformin.  Patient is already on losartan and has been for quite some time.

## 2019-08-10 LAB — URINALYSIS, ROUTINE W REFLEX MICROSCOPIC
Bilirubin Urine: NEGATIVE
Glucose, UA: NEGATIVE
Hgb urine dipstick: NEGATIVE
Ketones, ur: NEGATIVE
Leukocytes,Ua: NEGATIVE
Nitrite: NEGATIVE
Protein, ur: NEGATIVE
Specific Gravity, Urine: 1.021 (ref 1.001–1.03)
pH: 6.5 (ref 5.0–8.0)

## 2019-08-21 ENCOUNTER — Other Ambulatory Visit: Payer: Self-pay | Admitting: Family Medicine

## 2019-08-21 DIAGNOSIS — I10 Essential (primary) hypertension: Secondary | ICD-10-CM

## 2019-08-24 ENCOUNTER — Encounter: Payer: Self-pay | Admitting: Orthopaedic Surgery

## 2019-08-24 ENCOUNTER — Ambulatory Visit (INDEPENDENT_AMBULATORY_CARE_PROVIDER_SITE_OTHER): Admitting: Orthopaedic Surgery

## 2019-08-24 VITALS — Ht 69.0 in | Wt 281.0 lb

## 2019-08-24 DIAGNOSIS — M5136 Other intervertebral disc degeneration, lumbar region: Secondary | ICD-10-CM

## 2019-08-24 NOTE — Progress Notes (Signed)
Office Visit Note   Patient: Zachary Gillespie           Date of Birth: 05/07/1965           MRN: [id number] Visit Date: 08/24/2019              Requested by: [name], MD [address] [place],  [place] [address] PCP: [name], MD   Assessment & Plan: Visit Diagnoses:  1. DDD (degenerative disc disease), lumbar     Plan: We reviewed the MRI scan with patient.  He has some right paracentral disc protrusion L5-S1 and lateral recess and central stenosis moderate at L4-5.  We discussed continued weight loss.  He states he is able to walk half a mile.  We discussed indications for surgical decompression surgery.  We discussed epidural steroid injection possibility of increasing his glucose with prediabetes.  He will call if he has increased symptoms.  Follow-Up Instructions: Patient will return if he has increased radicular symptoms or stenosis symptoms.  Orders:  No orders of the defined types were placed in this encounter.  No orders of the defined types were placed in this encounter.     Procedures: No procedures performed   Clinical Data: No additional findings.   Subjective: Chief Complaint  Patient presents with  . Lower Back - Pain    HPI 54 year old male seen with back pain and right leg pain has been persistent.  Pain radiates into his right leg down to his foot with numbness and tingling.  If he sits or stands on his leg tends to go numb.  Initially when he gets up he is okay for period of time unless he tries to walk a certain prolonged distance.  He gets some relief with supine position.  He is used Tylenol which does not help.  Is not able to take ibuprofen.  He has seen Dr. Cindi Carbon who recommended weight loss.  He has had a normal BMP, but has a history of renal insufficiency.  Review of Systems positive obesity hypertension, history of renal insufficiency.  Back pain right leg pain.  Prediabetes not currently symptomatic.  He does have  sleep apnea.   Objective: Vital Signs: Ht 5\' 9"  (1.753 m)   Wt 281 lb (127.5 kg)   BMI 41.50 kg/m   Physical Exam Constitutional:      Appearance: He is well-developed.  HENT:     Head: Normocephalic and atraumatic.  Eyes:     Pupils: Pupils are equal, round, and reactive to light.  Neck:     Thyroid: No thyromegaly.     Trachea: No tracheal deviation.  Cardiovascular:     Rate and Rhythm: Normal rate.  Pulmonary:     Effort: Pulmonary effort is normal.     Breath sounds: No wheezing.  Abdominal:     General: Bowel sounds are normal.     Palpations: Abdomen is soft.  Skin:    General: Skin is warm and dry.     Capillary Refill: Capillary refill takes less than 2 seconds.  Neurological:     Mental Status: He is alert and oriented to person, place, and time.  Psychiatric:        Behavior: Behavior normal.        Thought Content: Thought content normal.        Judgment: Judgment normal.     Ortho Exam patient has some pain with straight leg raising the right some slight notch  tenderness negative logroll to the hips.  Quads anterior tib gastrocsoleus is strong distal pulses are intact.  Specialty Comments:  No specialty comments available.  Imaging: CLINICAL DATA:  Initial evaluation for low back pain, spinal stenosis.  EXAM: MRI LUMBAR SPINE WITHOUT CONTRAST  TECHNIQUE: Multiplanar, multisequence MR imaging of the lumbar spine was performed. No intravenous contrast was administered.  COMPARISON:  Prior MRI from 03/22/2013.  FINDINGS: Segmentation: Standard. Lowest well-formed disc space labeled the L5-S1 level.  Alignment: Physiologic with preservation of the normal lumbar lordosis. No listhesis.  Vertebrae: Of vertebral body height maintained without evidence for acute or chronic fracture. Bone marrow signal intensity within normal limits. No discrete or worrisome osseous lesions. No abnormal marrow edema.  Conus medullaris and cauda equina:  Conus extends to the T12-L1 level. Conus and cauda equina appear normal.  Paraspinal and other soft tissues: Paraspinous soft tissues within normal limits. Approximate 3.8 cm simple cyst partially visualized at the posterior left kidney. Visualized visceral structures otherwise unremarkable.  Disc levels:  T12-L1: Unremarkable.  L1-2:  Unremarkable.  L2-3:  Unremarkable.  L3-4: Normal interspace. Mild facet hypertrophy. No canal or foraminal stenosis. No impingement.  L4-5: Mild diffuse disc bulge with disc desiccation. Superimposed small central/right paracentral disc protrusion minimally indents the ventral thecal sac (series 8, image 24). Superimposed mild facet and ligament flavum hypertrophy. Resultant mild to moderate canal with bilateral lateral recess stenosis, slightly worse on the right. Foramina remain patent.  L5-S1: Disc desiccation with mild intervertebral disc space narrowing. Mild reactive endplate spurring. Superimposed small central to right subarticular disc protrusion with inferior migration (series 8, image 30). Associated annular fissure. Protruding disc material closely approximates the descending right S1 nerve root without frank neural impingement. Superimposed mild epidural lipomatosis. No significant spinal stenosis. Mild left L5 foraminal narrowing related to disc bulge and reactive endplate changes.  IMPRESSION: 1. Disc bulge with small central/right paracentral disc protrusion at L4-5 with resultant mild to moderate canal and bilateral lateral recess stenosis, slightly worse on the right. 2. Small central to right subarticular disc protrusion with inferior migration at L5-S1, closely approximating and potentially irritating the descending right S1 nerve root. 3. Mild left L5 foraminal stenosis related to disc bulge and reactive endplate changes.   Electronically Signed   By: Jeannine Boga M.D.   On: 08/04/2019  04:25    PMFS History: Patient Active Problem List   Diagnosis Date Noted  . Moderate single current episode of major depressive disorder ([place]) 07/22/2019  . Joint stiffness of hand, left 10/07/2018  . Sleep apnea   . At risk for obstructive sleep apnea 06/10/2018  . Non-recurrent unilateral inguinal hernia without obstruction or gangrene   . Calculus of ureter 07/20/2017  . Nephrolithiasis 07/20/2017  . Prediabetes   . HLD (hyperlipidemia)   . Obesity   . Hypertension   . DDD (degenerative disc disease), lumbar   . GERD (gastroesophageal reflux disease)   . Sciatica of left side   . Dyslipidemia    Past Medical History:  Diagnosis Date  . DDD (degenerative disc disease), lumbar   . Dyslipidemia   . GERD (gastroesophageal reflux disease)   . History of hiatal hernia   . History of kidney stones    H/O  . HLD (hyperlipidemia)   . Hypertension   . Obesity   . Prediabetes   . Sciatica of left side   . Sleep apnea    AHI 34  . Tubular adenoma of colon  2019 (Dr. Benson Norway)    Family History  Problem Relation Age of Onset  . Cancer Mother        mass on liver  . Heart disease Father   . Diabetes Father   . Hyperlipidemia Father   . Diabetes Sister   . Heart disease Paternal Grandfather   . Bladder Cancer Neg Hx   . Kidney cancer Neg Hx   . Prostate cancer Neg Hx     Past Surgical History:  Procedure Laterality Date  . AMPUTATION Left 05/16/2018   Procedure: Ray Resection Left Index Finger;  Surgeon: [name], MD;  Location: [facility];  Service: Orthopedics;  Laterality: Left;  . COLONOSCOPY    . ESOPHAGOGASTRODUODENOSCOPY    . EXTRACORPOREAL SHOCK WAVE LITHOTRIPSY Left 07/24/2017   Procedure: EXTRACORPOREAL SHOCK WAVE LITHOTRIPSY (ESWL);  Surgeon: [name], MD;  Location: [facility];  Service: Urology;  Laterality: Left;  . EXTRACORPOREAL SHOCK WAVE LITHOTRIPSY Right 12/11/2017   Procedure: EXTRACORPOREAL SHOCK WAVE LITHOTRIPSY (ESWL);  Surgeon: [name], MD;  Location: [facility];  Service: Urology;  Laterality: Right;  . EYE SURGERY    . INGUINAL HERNIA REPAIR Right 08/08/2017   Procedure: LAPAROSCOPIC INGUINAL HERNIA;  Surgeon: [name], MD;  Location: [facility];  Service: General;  Laterality: Right;  . NERVE REPAIR Left 05/16/2018   Procedure: Cutaneous Nerve Repair;  Surgeon: [name], MD;  Location: [facility];  Service: Orthopedics;  Laterality: Left;  . SKIN FULL THICKNESS GRAFT Left 05/16/2018   Procedure: Skin Graft Full Thickness;  Surgeon: [name], MD;  Location: [facility];  Service: Orthopedics;  Laterality: Left;  . TENDON REPAIR Left 05/16/2018   Procedure: Extensor Tendon Repair;  Surgeon: [name], MD;  Location: [facility];  Service: Orthopedics;  Laterality: Left;  . TONSILLECTOMY    . VASECTOMY     Social History   Occupational History  . Not on file  Tobacco Use  . Smoking status: Never Smoker  . Smokeless tobacco: Never Used  Vaping Use  . Vaping Use: Never used  Substance and Sexual Activity  . Alcohol use: Yes    Comment: RARE  . Drug use: No  . Sexual activity: Yes

## 2019-09-01 ENCOUNTER — Encounter: Payer: Self-pay | Admitting: Orthopaedic Surgery

## 2019-10-11 ENCOUNTER — Encounter: Payer: Self-pay | Admitting: Family Medicine

## 2019-10-17 ENCOUNTER — Other Ambulatory Visit: Payer: Self-pay | Admitting: Family Medicine

## 2019-11-12 ENCOUNTER — Other Ambulatory Visit: Payer: Self-pay

## 2019-11-12 ENCOUNTER — Ambulatory Visit
Admission: RE | Admit: 2019-11-12 | Discharge: 2019-11-12 | Disposition: A | Discharge status: Home / Self Care | Source: Ambulatory Visit | Attending: Family Medicine | Admitting: Family Medicine

## 2019-11-12 ENCOUNTER — Ambulatory Visit (INDEPENDENT_AMBULATORY_CARE_PROVIDER_SITE_OTHER): Admitting: Family Medicine

## 2019-11-12 VITALS — BP 118/70 | HR 85 | Temp 98.0°F | Ht 69.0 in | Wt 286.0 lb

## 2019-11-12 DIAGNOSIS — M25561 Pain in right knee: Secondary | ICD-10-CM

## 2019-11-12 NOTE — Progress Notes (Signed)
Subjective:    Patient ID: Zachary Gillespie, male    DOB: 26-Dec-1965, 54 y.o.   MRN: [id number]  Patient is a very pleasant 54 year old Caucasian male who presents today with right-sided knee pain.  He states has been gradually occurring however is gotten to the point that he is unable to walk up and down steps in a normal fashion.  He has sharp pain directly underneath the patella and at the superior pole of the patella whenever he tries to climb steps or go down steps.  He is having to walk down the steps sideways in order not to put his full weight on his knee.  He denies any falls or injuries.  However earlier this year had to take him off Celebrex due to a sudden decline in his renal function.  Creatinine rapidly increased to 1.7.  After stopping Celebrex it fell back to 0.9 which is his baseline.  However off the NSAIDs he has developed this insidious onset of right-sided subpatellar knee pain.  He denies any laxity to varus or valgus stress however he does feel like his knee may buckle when he is going down steps.  He is afraid to put his full weight on his knee.  He has a negative anterior and posterior drawer sign today.  He has no laxity to varus or valgus stress however he does have pain with patellar grind. Past Medical History:  Diagnosis Date  . DDD (degenerative disc disease), lumbar   . Dyslipidemia   . GERD (gastroesophageal reflux disease)   . History of hiatal hernia   . History of kidney stones    H/O  . HLD (hyperlipidemia)   . Hypertension   . Obesity   . Prediabetes   . Sciatica of left side   . Sleep apnea    AHI 34  . Tubular adenoma of colon    2019 (Dr. Benson Norway)   Past Surgical History:  Procedure Laterality Date  . AMPUTATION Left 05/16/2018   Procedure: Ray Resection Left Index Finger;  Surgeon: [name], MD;  Location: [place];  Service: Orthopedics;  Laterality: Left;  . COLONOSCOPY    . ESOPHAGOGASTRODUODENOSCOPY    . EXTRACORPOREAL SHOCK WAVE LITHOTRIPSY  Left 07/24/2017   Procedure: EXTRACORPOREAL SHOCK WAVE LITHOTRIPSY (ESWL);  Surgeon: [name], MD;  Location: [facility];  Service: Urology;  Laterality: Left;  . EXTRACORPOREAL SHOCK WAVE LITHOTRIPSY Right 12/11/2017   Procedure: EXTRACORPOREAL SHOCK WAVE LITHOTRIPSY (ESWL);  Surgeon: [name], MD;  Location: [facility];  Service: Urology;  Laterality: Right;  . EYE SURGERY    . INGUINAL HERNIA REPAIR Right 08/08/2017   Procedure: LAPAROSCOPIC INGUINAL HERNIA;  Surgeon: [name], MD;  Location: [facility];  Service: General;  Laterality: Right;  . NERVE REPAIR Left 05/16/2018   Procedure: Cutaneous Nerve Repair;  Surgeon: [name], MD;  Location: [place];  Service: Orthopedics;  Laterality: Left;  . SKIN FULL THICKNESS GRAFT Left 05/16/2018   Procedure: Skin Graft Full Thickness;  Surgeon: [name], MD;  Location: [place];  Service: Orthopedics;  Laterality: Left;  . TENDON REPAIR Left 05/16/2018   Procedure: Extensor Tendon Repair;  Surgeon: [name], MD;  Location: [place];  Service: Orthopedics;  Laterality: Left;  . TONSILLECTOMY    . VASECTOMY     Current Outpatient Medications on File Prior to Visit  Medication Sig Dispense Refill  . aspirin EC 81 MG tablet Take 81 mg by mouth at bedtime.     Marland Kitchen  atorvastatin (LIPITOR) 40 MG tablet TAKE 1 TABLET DAILY 90 tablet 3  . BD PEN NEEDLE MICRO U/F 32G X 6 MM MISC USE TO INJECT SAXENDA UNDER THE SKIN DAILY AS DIRECTED 100 each 3  . escitalopram (LEXAPRO) 10 MG tablet TAKE 1 TABLET DAILY 90 tablet 3  . fluticasone (FLONASE) 50 MCG/ACT nasal spray USE 2 SPRAYS IN EACH NOSTRIL DAILY 48 g 3  . hydrochlorothiazide (HYDRODIURIL) 25 MG tablet TAKE 1 TABLET DAILY 90 tablet 3  . Insulin Pen Needle (RELION PEN NEEDLES) 32G X 4 MM MISC Use daily with Saxenda 100 each 3  . Liraglutide -Weight Management (SAXENDA) 18 MG/3ML SOPN Inject 0.5 mLs (3 mg total) into the skin daily. 5 pen 3  . losartan (COZAAR) 100 MG tablet TAKE 1 TABLET DAILY 90  tablet 3  . Melatonin 10 MG TABS Take 10 mg by mouth at bedtime.     . metFORMIN (GLUCOPHAGE) 500 MG tablet TAKE 2 TABLETS TWICE A DAY WITH MEALS 360 tablet 3  . Multiple Vitamins-Minerals (ONE-A-DAY MENS HEALTH FORMULA PO) Take 1 tablet by mouth daily.     . Omega-3 Krill Oil 500 MG CAPS Take 500 mg by mouth daily.     Marland Kitchen omeprazole (PRILOSEC) 40 MG capsule TAKE 1 CAPSULE DAILY 90 capsule 3  . celecoxib (CELEBREX) 200 MG capsule TAKE 1 CAPSULE DAILY 90 capsule 3   No current facility-administered medications on file prior to visit.   Allergies  Allergen Reactions  . Penicillins Anaphylaxis and Other (See Comments)    Has patient had a PCN reaction causing immediate rash, facial/tongue/throat swelling, SOB or lightheadedness with hypotension: Yes Has patient had a PCN reaction causing severe rash involving mucus membranes or skin necrosis: No Has patient had a PCN reaction that required hospitalization: Yes Has patient had a PCN reaction occurring within the last 10 years: No If all of the above answers are "NO", then may proceed with Cephalosporin use.   . Sulfa Antibiotics Swelling and Other (See Comments)    Childhood allergy- site of swelling not recalled  . Keflex [Cephalexin] Other (See Comments)    Childhood allergy- reaction not recalled   Social History   Socioeconomic History  . Marital status: Married    Spouse name: Not on file  . Number of children: 1  . Years of education: college  . Highest education level: Not on file  Occupational History  . Not on file  Tobacco Use  . Smoking status: Never Smoker  . Smokeless tobacco: Never Used  Vaping Use  . Vaping Use: Never used  Substance and Sexual Activity  . Alcohol use: Yes    Comment: RARE  . Drug use: No  . Sexual activity: Yes  Other Topics Concern  . Not on file  Social History Narrative   Drinks about 1 cup of tea a day    Social Determinants of Health   Financial Resource Strain:   . Difficulty of  Paying Living Expenses: Not on file  Food Insecurity:   . Worried About Charity fundraiser in the Last Year: Not on file  . Ran Out of Food in the Last Year: Not on file  Transportation Needs:   . Lack of Transportation (Medical): Not on file  . Lack of Transportation (Non-Medical): Not on file  Physical Activity:   . Days of Exercise per Week: Not on file  . Minutes of Exercise per Session: Not on file  Stress:   . Feeling of  Stress : Not on file  Social Connections:   . Frequency of Communication with Friends and Family: Not on file  . Frequency of Social Gatherings with Friends and Family: Not on file  . Attends Religious Services: Not on file  . Active Member of Clubs or Organizations: Not on file  . Attends Archivist Meetings: Not on file  . Marital Status: Not on file  Intimate Partner Violence:   . Fear of Current or Ex-Partner: Not on file  . Emotionally Abused: Not on file  . Physically Abused: Not on file  . Sexually Abused: Not on file      Review of Systems  Musculoskeletal: Positive for back pain.  All other systems reviewed and are negative.      Objective:   Physical Exam Vitals reviewed.  Constitutional:      Appearance: Normal appearance. He is obese.  Cardiovascular:     Rate and Rhythm: Normal rate and regular rhythm.     Heart sounds: Normal heart sounds.  Pulmonary:     Effort: Pulmonary effort is normal. No respiratory distress.     Breath sounds: Normal breath sounds. No wheezing, rhonchi or rales.  Abdominal:     General: Abdomen is flat. Bowel sounds are normal. There is no distension.     Palpations: Abdomen is soft.     Tenderness: There is no abdominal tenderness. There is no guarding.  Musculoskeletal:     Right knee: Bony tenderness and crepitus present. No swelling, deformity or effusion. Normal range of motion. Tenderness present over the patellar tendon. No medial joint line or lateral joint line tenderness. Normal  alignment, normal meniscus and normal patellar mobility.  Neurological:     Mental Status: He is alert.           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  Therefore I recommend an x-ray of the knee including sunrise views.  If x-ray confirms tricompartmental arthritis I would recommend a cortisone injection in the knee as the patient is unable to take NSAIDs.  If cortisone injections are not beneficial, would then recommend orthopedic consultation for possible arthroscopic surgery.

## 2019-11-15 ENCOUNTER — Encounter: Payer: Self-pay | Admitting: Family Medicine

## 2019-11-19 ENCOUNTER — Ambulatory Visit (INDEPENDENT_AMBULATORY_CARE_PROVIDER_SITE_OTHER): Admitting: Family Medicine

## 2019-11-19 ENCOUNTER — Other Ambulatory Visit: Payer: Self-pay

## 2019-11-19 VITALS — BP 120/74 | HR 107 | Temp 98.5°F | Ht 69.0 in | Wt 286.0 lb

## 2019-11-19 DIAGNOSIS — M222X1 Patellofemoral disorders, right knee: Secondary | ICD-10-CM | POA: Diagnosis not present

## 2019-11-19 NOTE — Progress Notes (Signed)
Subjective:    Patient ID: Zachary Gillespie, male    DOB: [birth year], 54 y.o.   MRN: [id number]  11/12/19 Patient is a very pleasant 54 year old Caucasian male who presents today with right-sided knee pain.  He states has been gradually occurring however is gotten to the point that he is unable to walk up and down steps in a normal fashion.  He has sharp pain directly underneath the patella and at the superior pole of the patella whenever he tries to climb steps or go down steps.  He is having to walk down the steps sideways in order not to put his full weight on his knee.  He denies any falls or injuries.  However earlier this year had to take him off Celebrex due to a sudden decline in his renal function.  Creatinine rapidly increased to 1.7.  After stopping Celebrex it fell back to 0.9 which is his baseline.  However off the NSAIDs he has developed this insidious onset of right-sided subpatellar knee pain.  He denies any laxity to varus or valgus stress however he does feel like his knee may buckle when he is going down steps.  He is afraid to put his full weight on his knee.  He has a negative anterior and posterior drawer sign today.  He has no laxity to varus or valgus stress however he does have pain with patellar grind.  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  Therefore I recommend an x-ray of the knee including sunrise views.  If x-ray confirms tricompartmental arthritis I would recommend a cortisone injection in the knee as the patient is unable to take NSAIDs.  If cortisone injections are not beneficial, would then recommend orthopedic consultation for possible arthroscopic surgery.  11/19/19 I was surprised, the x-rays did not show tricompartmental arthritis.  The joint spaces were preserved.  However on the sunrise view, I felt that there was malalignment of the patella suggesting possible underlying  patellofemoral pain as a source.  Patient is here today for cortisone injection. Past Medical History:  Diagnosis Date  . DDD (degenerative disc disease), lumbar   . Dyslipidemia   . GERD (gastroesophageal reflux disease)   . History of hiatal hernia   . History of kidney stones    H/O  . HLD (hyperlipidemia)   . Hypertension   . Obesity   . Prediabetes   . Sciatica of left side   . Sleep apnea    AHI 34  . Tubular adenoma of colon    2019 (Dr. Benson Norway)   Past Surgical History:  Procedure Laterality Date  . AMPUTATION Left 05/16/2018   Procedure: Ray Resection Left Index Finger;  Surgeon: [name], MD;  Location: [place];  Service: Orthopedics;  Laterality: Left;  . COLONOSCOPY    . ESOPHAGOGASTRODUODENOSCOPY    . EXTRACORPOREAL SHOCK WAVE LITHOTRIPSY Left 07/24/2017   Procedure: EXTRACORPOREAL SHOCK WAVE LITHOTRIPSY (ESWL);  Surgeon: [name], MD;  Location: [facility];  Service: Urology;  Laterality: Left;  . EXTRACORPOREAL SHOCK WAVE LITHOTRIPSY Right 12/11/2017   Procedure: EXTRACORPOREAL SHOCK WAVE LITHOTRIPSY (ESWL);  Surgeon: [name], MD;  Location: [facility];  Service: Urology;  Laterality: Right;  . EYE SURGERY    . INGUINAL HERNIA REPAIR Right 08/08/2017   Procedure: LAPAROSCOPIC INGUINAL HERNIA;  Surgeon: [name], MD;  Location: [facility];  Service: General;  Laterality: Right;  . NERVE REPAIR Left 05/16/2018  Procedure: Cutaneous Nerve Repair;  Surgeon: [name], MD;  Location: [place];  Service: Orthopedics;  Laterality: Left;  . SKIN FULL THICKNESS GRAFT Left 05/16/2018   Procedure: Skin Graft Full Thickness;  Surgeon: [name], MD;  Location: [place];  Service: Orthopedics;  Laterality: Left;  . TENDON REPAIR Left 05/16/2018   Procedure: Extensor Tendon Repair;  Surgeon: [name], MD;  Location: [place];  Service: Orthopedics;  Laterality: Left;  . TONSILLECTOMY    . VASECTOMY     Current Outpatient Medications on File Prior to Visit  Medication  Sig Dispense Refill  . aspirin EC 81 MG tablet Take 81 mg by mouth at bedtime.     Marland Kitchen atorvastatin (LIPITOR) 40 MG tablet TAKE 1 TABLET DAILY 90 tablet 3  . BD PEN NEEDLE MICRO U/F 32G X 6 MM MISC USE TO INJECT SAXENDA UNDER THE SKIN DAILY AS DIRECTED 100 each 3  . celecoxib (CELEBREX) 200 MG capsule TAKE 1 CAPSULE DAILY 90 capsule 3  . escitalopram (LEXAPRO) 10 MG tablet TAKE 1 TABLET DAILY 90 tablet 3  . fluticasone (FLONASE) 50 MCG/ACT nasal spray USE 2 SPRAYS IN EACH NOSTRIL DAILY 48 g 3  . hydrochlorothiazide (HYDRODIURIL) 25 MG tablet TAKE 1 TABLET DAILY 90 tablet 3  . Insulin Pen Needle (RELION PEN NEEDLES) 32G X 4 MM MISC Use daily with Saxenda 100 each 3  . Liraglutide -Weight Management (SAXENDA) 18 MG/3ML SOPN Inject 0.5 mLs (3 mg total) into the skin daily. 5 pen 3  . losartan (COZAAR) 100 MG tablet TAKE 1 TABLET DAILY 90 tablet 3  . Melatonin 10 MG TABS Take 10 mg by mouth at bedtime.     . metFORMIN (GLUCOPHAGE) 500 MG tablet TAKE 2 TABLETS TWICE A DAY WITH MEALS 360 tablet 3  . Multiple Vitamins-Minerals (ONE-A-DAY MENS HEALTH FORMULA PO) Take 1 tablet by mouth daily.     . Omega-3 Krill Oil 500 MG CAPS Take 500 mg by mouth daily.     Marland Kitchen omeprazole (PRILOSEC) 40 MG capsule TAKE 1 CAPSULE DAILY 90 capsule 3   No current facility-administered medications on file prior to visit.   Allergies  Allergen Reactions  . Penicillins Anaphylaxis and Other (See Comments)    Has patient had a PCN reaction causing immediate rash, facial/tongue/throat swelling, SOB or lightheadedness with hypotension: Yes Has patient had a PCN reaction causing severe rash involving mucus membranes or skin necrosis: No Has patient had a PCN reaction that required hospitalization: Yes Has patient had a PCN reaction occurring within the last 10 years: No If all of the above answers are "NO", then may proceed with Cephalosporin use.   . Sulfa Antibiotics Swelling and Other (See Comments)    Childhood allergy-  site of swelling not recalled  . Keflex [Cephalexin] Other (See Comments)    Childhood allergy- reaction not recalled   Social History   Socioeconomic History  . Marital status: Married    Spouse name: Not on file  . Number of children: 1  . Years of education: college  . Highest education level: Not on file  Occupational History  . Not on file  Tobacco Use  . Smoking status: Never Smoker  . Smokeless tobacco: Never Used  Vaping Use  . Vaping Use: Never used  Substance and Sexual Activity  . Alcohol use: Yes    Comment: RARE  . Drug use: No  . Sexual activity: Yes  Other Topics Concern  . Not on file  Social  History Narrative   Drinks about 1 cup of tea a day    Social Determinants of Health   Financial Resource Strain:   . Difficulty of Paying Living Expenses: Not on file  Food Insecurity:   . Worried About Charity fundraiser in the Last Year: Not on file  . Ran Out of Food in the Last Year: Not on file  Transportation Needs:   . Lack of Transportation (Medical): Not on file  . Lack of Transportation (Non-Medical): Not on file  Physical Activity:   . Days of Exercise per Week: Not on file  . Minutes of Exercise per Session: Not on file  Stress:   . Feeling of Stress : Not on file  Social Connections:   . Frequency of Communication with Friends and Family: Not on file  . Frequency of Social Gatherings with Friends and Family: Not on file  . Attends Religious Services: Not on file  . Active Member of Clubs or Organizations: Not on file  . Attends Archivist Meetings: Not on file  . Marital Status: Not on file  Intimate Partner Violence:   . Fear of Current or Ex-Partner: Not on file  . Emotionally Abused: Not on file  . Physically Abused: Not on file  . Sexually Abused: Not on file      Review of Systems  Musculoskeletal: Positive for back pain.  All other systems reviewed and are negative.      Objective:   Physical Exam Vitals reviewed.    Constitutional:      Appearance: Normal appearance. He is obese.  Cardiovascular:     Rate and Rhythm: Normal rate and regular rhythm.     Heart sounds: Normal heart sounds.  Pulmonary:     Effort: Pulmonary effort is normal. No respiratory distress.     Breath sounds: Normal breath sounds. No wheezing, rhonchi or rales.  Abdominal:     General: Abdomen is flat. Bowel sounds are normal. There is no distension.     Palpations: Abdomen is soft.     Tenderness: There is no abdominal tenderness. There is no guarding.  Musculoskeletal:     Right knee: Bony tenderness and crepitus present. No swelling, deformity or effusion. Normal range of motion. Tenderness present over the patellar tendon. No medial joint line or lateral joint line tenderness. Normal alignment, normal meniscus and normal patellar mobility.  Neurological:     Mental Status: He is alert.           Assessment & Plan:  Patellofemoral pain syndrome of right knee  I believe the patient has patellofemoral syndrome.  There is also the possibility of a cartilage defect on the undersurface of the patella.  We will try cortisone injection first and if not better try physical therapy.  Using sterile technique, I injected the right knee with a mixture of 2 cc lidocaine, 2 cc of Marcaine, and 2 cc of 40 mg/mL Kenalog.  The patient tolerated the procedure well without complication.

## 2019-11-23 ENCOUNTER — Encounter: Payer: Self-pay | Admitting: Family Medicine

## 2019-12-04 IMAGING — CR DG HAND COMPLETE 3+V*L*
3 series · 3 of 3 positions shown · non-contrast
Comparison: None.

CLINICAL DATA: Firm area in the first web space unable to close
hand entirely

EXAM:
LEFT HAND - COMPLETE 3+ VIEW

[hand pa]
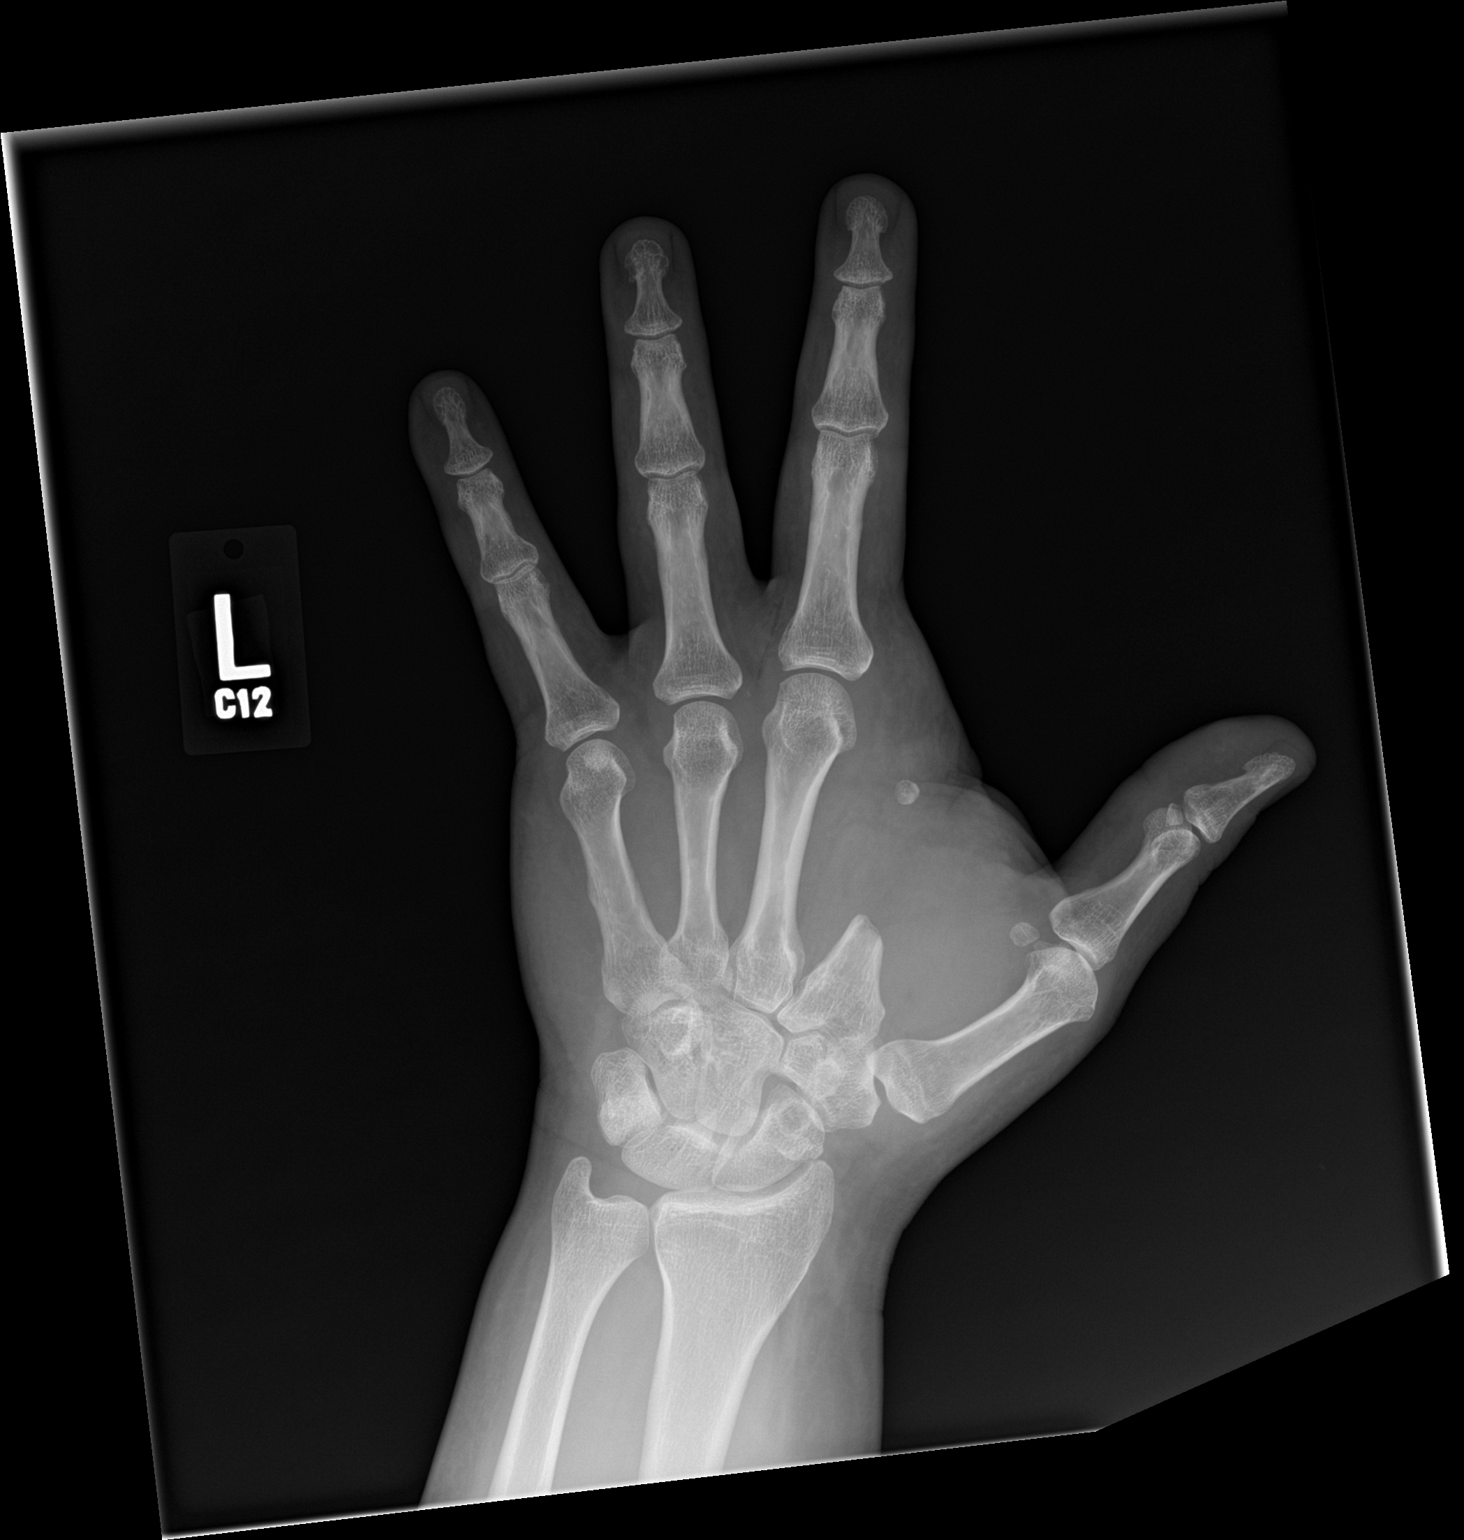

[hand obl]
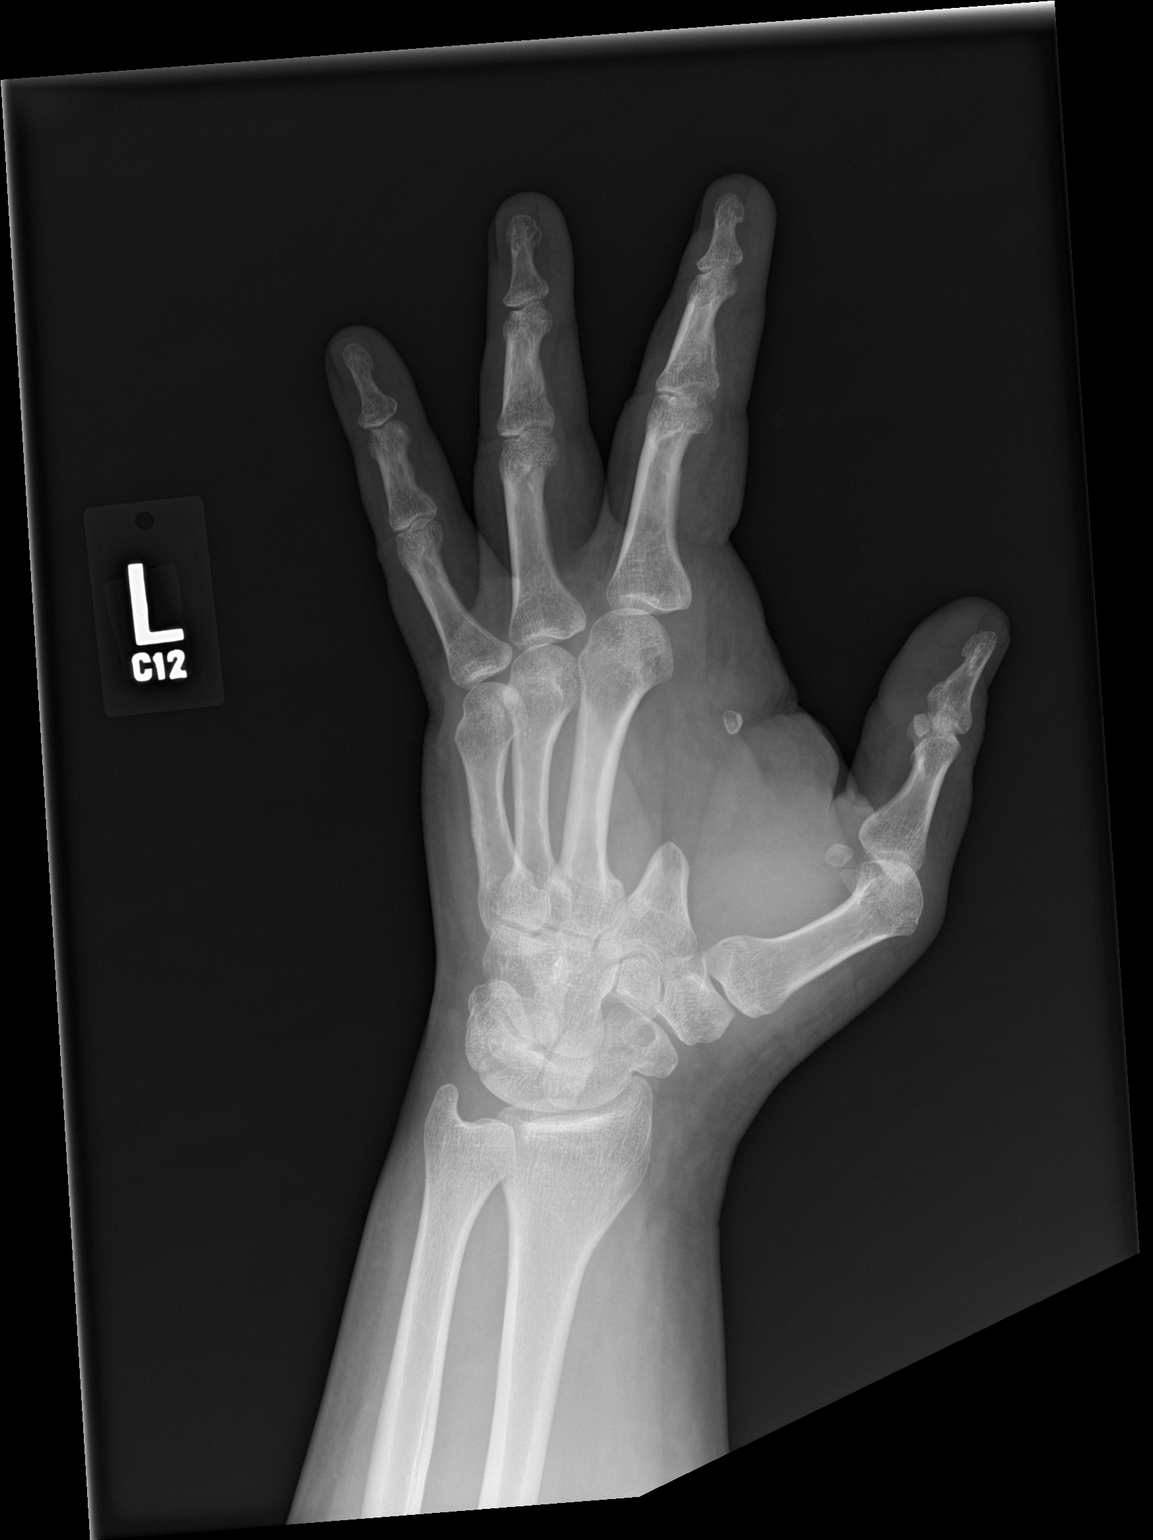

[hand lat]
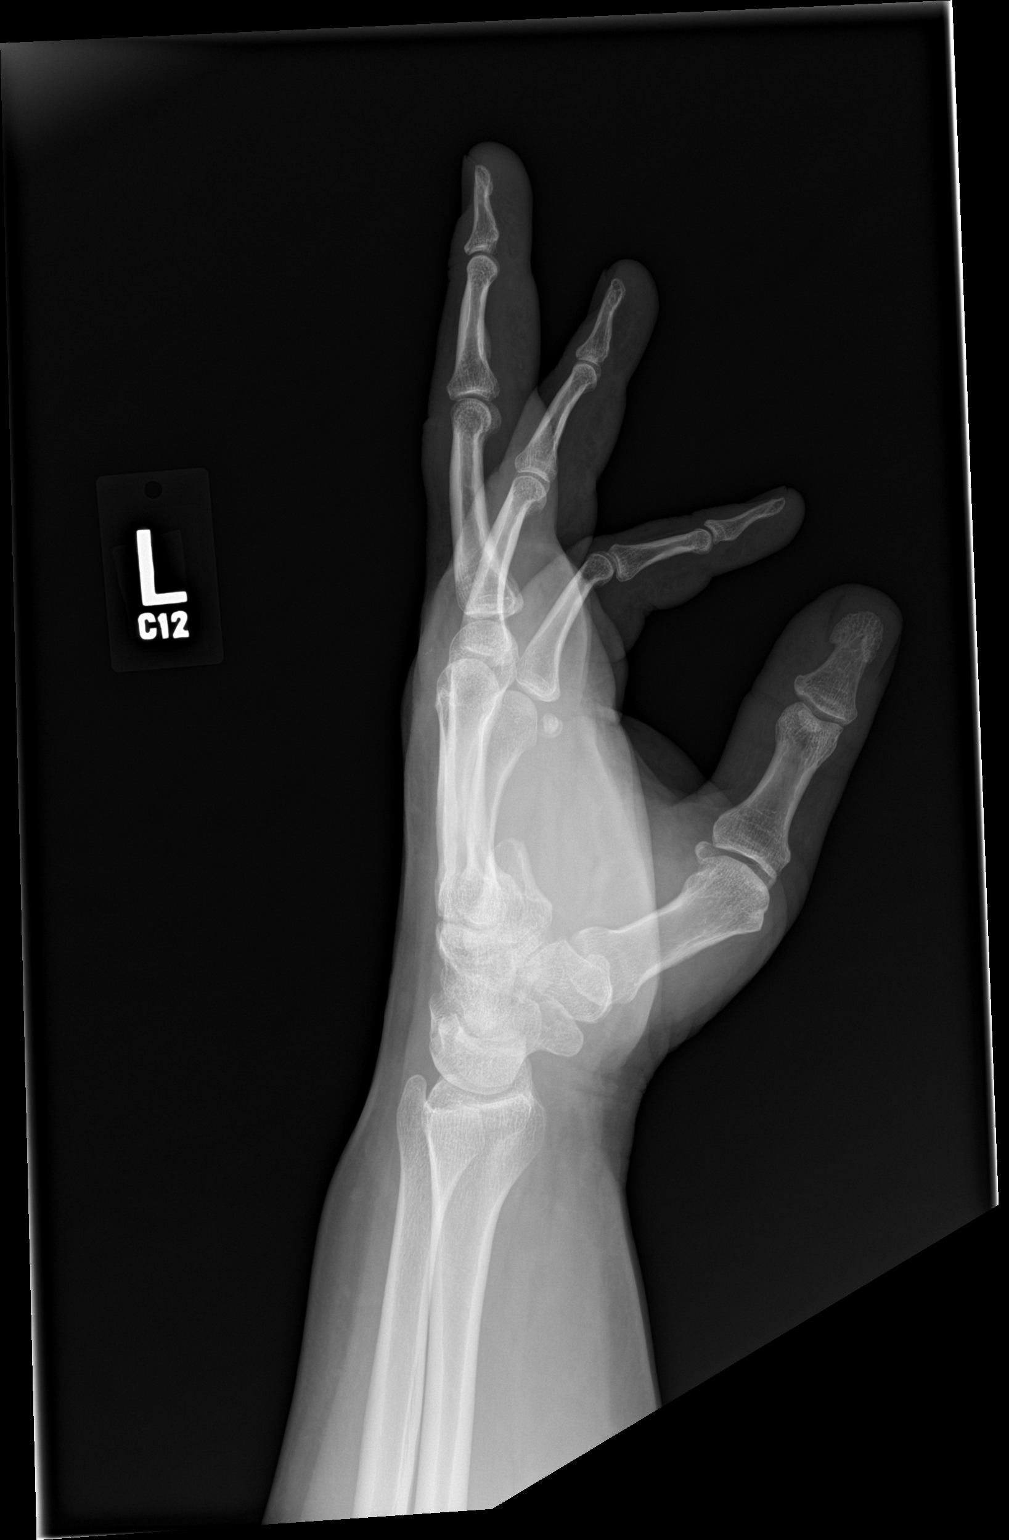

[3 of 3 positions shown; findings below may reference images not displayed]

FINDINGS: Patient is post right trans metacarpal amputation of the second
digit. There is retention of the second carpometacarpal ossicle
within the soft tissues. Mild soft tissue swelling is noted at the
first web space without a radiographically discernible mass
identified. Remaining bones of the hand are unchanged from
comparison exams. No radiopaque foreign body or soft tissue gas.
IMPRESSION: 1. Patient is post right trans metacarpal amputation of the second
digit.
2. There is retention of the second carpometacarpal ossicle
3. Soft tissue swelling of the webspace without a radiographically
discernible mass soft tissue gas or foreign body.
4. No acute osseous abnormality.

## 2020-01-19 ENCOUNTER — Ambulatory Visit: Admitting: Neurology

## 2020-02-10 ENCOUNTER — Encounter: Payer: Self-pay | Admitting: Family Medicine

## 2020-02-10 ENCOUNTER — Other Ambulatory Visit: Payer: Self-pay

## 2020-02-10 ENCOUNTER — Ambulatory Visit (INDEPENDENT_AMBULATORY_CARE_PROVIDER_SITE_OTHER): Admitting: Family Medicine

## 2020-02-10 VITALS — BP 126/74 | HR 93 | Temp 97.1°F | Ht 69.0 in | Wt 282.0 lb

## 2020-02-10 DIAGNOSIS — S9032XA Contusion of left foot, initial encounter: Secondary | ICD-10-CM | POA: Diagnosis not present

## 2020-02-10 DIAGNOSIS — M7552 Bursitis of left shoulder: Secondary | ICD-10-CM | POA: Diagnosis not present

## 2020-02-10 DIAGNOSIS — M25562 Pain in left knee: Secondary | ICD-10-CM

## 2020-02-10 DIAGNOSIS — E118 Type 2 diabetes mellitus with unspecified complications: Secondary | ICD-10-CM

## 2020-02-10 MED ORDER — MELOXICAM 15 MG PO TABS: 15.0000 mg | ORAL_TABLET | Freq: Every day | ORAL | 0 refills | Status: DC

## 2020-02-10 NOTE — Progress Notes (Signed)
Subjective:    Patient ID: Zachary Gillespie, male    DOB: [birth year], 55 y.o.   MRN: [id number]  Patient is a very pleasant 55 year old Caucasian male who presents today with several complaints.  First he is having pain in his left shoulder.  He reports pain with abduction greater than 90 degrees.  He has some pain with internal rotation.  He has a positive empty can test.  He has a negative drop test.  He has minimal pain with Hawkins maneuver.  He has a negative O'Brien sign.  There is no crepitus with range of motion.  I believe his symptoms are more indicative of subacromial bursitis.  This is been going on for a few weeks.  He denies any falls or injuries.  He is also developing pain in his left knee.  The pain is primarily located over the lateral joint line.  It hurts when he is going up and down steps.  He denies any laxity to varus or valgus stress.  He denies any locking in the knee.  He has a negative anterior and posterior drawer sign today.  He has a negative Apley grind.  There is no effusion or erythema.  He also complains of pain with standing on the dorsum of his left foot.  3 weeks ago he dropped a heavy case on his foot.  He could instantly bear weight.  He denies any tenderness to palpation.  There is no bruising.  There is no swelling.  There is no palpable defect.  There is no erythema however there is some tightness when he stands in that area in the midfoot on the dorsum of the midfoot.  He is also due for fasting lab work to monitor his diabetes. Past Medical History:  Diagnosis Date  . DDD (degenerative disc disease), lumbar   . Dyslipidemia   . GERD (gastroesophageal reflux disease)   . History of hiatal hernia   . History of kidney stones    H/O  . HLD (hyperlipidemia)   . Hypertension   . Obesity   . Prediabetes   . Sciatica of left side   . Sleep apnea    AHI 34  . Tubular adenoma of colon    2019 (Dr. Benson Norway)   Past Surgical History:  Procedure Laterality Date   . AMPUTATION Left 05/16/2018   Procedure: Ray Resection Left Index Finger;  Surgeon: [name], MD;  Location: [place];  Service: Orthopedics;  Laterality: Left;  . COLONOSCOPY    . ESOPHAGOGASTRODUODENOSCOPY    . EXTRACORPOREAL SHOCK WAVE LITHOTRIPSY Left 07/24/2017   Procedure: EXTRACORPOREAL SHOCK WAVE LITHOTRIPSY (ESWL);  Surgeon: [name], MD;  Location: [facility];  Service: Urology;  Laterality: Left;  . EXTRACORPOREAL SHOCK WAVE LITHOTRIPSY Right 12/11/2017   Procedure: EXTRACORPOREAL SHOCK WAVE LITHOTRIPSY (ESWL);  Surgeon: [name], MD;  Location: [facility];  Service: Urology;  Laterality: Right;  . EYE SURGERY    . INGUINAL HERNIA REPAIR Right 08/08/2017   Procedure: LAPAROSCOPIC INGUINAL HERNIA;  Surgeon: [name], MD;  Location: [facility];  Service: General;  Laterality: Right;  . NERVE REPAIR Left 05/16/2018   Procedure: Cutaneous Nerve Repair;  Surgeon: [name], MD;  Location: [place];  Service: Orthopedics;  Laterality: Left;  . SKIN FULL THICKNESS GRAFT Left 05/16/2018   Procedure: Skin Graft Full Thickness;  Surgeon: [name], MD;  Location: [place];  Service: Orthopedics;  Laterality: Left;  . TENDON REPAIR Left 05/16/2018   Procedure: Extensor Tendon  Repair;  Surgeon: [name], MD;  Location: [place];  Service: Orthopedics;  Laterality: Left;  . TONSILLECTOMY    . VASECTOMY     Current Outpatient Medications on File Prior to Visit  Medication Sig Dispense Refill  . aspirin EC 81 MG tablet Take 81 mg by mouth at bedtime.     Marland Kitchen atorvastatin (LIPITOR) 40 MG tablet TAKE 1 TABLET DAILY 90 tablet 3  . BD PEN NEEDLE MICRO U/F 32G X 6 MM MISC USE TO INJECT SAXENDA UNDER THE SKIN DAILY AS DIRECTED 100 each 3  . escitalopram (LEXAPRO) 10 MG tablet TAKE 1 TABLET DAILY 90 tablet 3  . fluticasone (FLONASE) 50 MCG/ACT nasal spray USE 2 SPRAYS IN EACH NOSTRIL DAILY 48 g 3  . hydrochlorothiazide (HYDRODIURIL) 25 MG tablet TAKE 1 TABLET DAILY 90 tablet 3  . Insulin Pen  Needle (RELION PEN NEEDLES) 32G X 4 MM MISC Use daily with Saxenda 100 each 3  . Liraglutide -Weight Management (SAXENDA) 18 MG/3ML SOPN Inject 0.5 mLs (3 mg total) into the skin daily. 5 pen 3  . losartan (COZAAR) 100 MG tablet TAKE 1 TABLET DAILY 90 tablet 3  . Melatonin 10 MG TABS Take 10 mg by mouth at bedtime.     . metFORMIN (GLUCOPHAGE) 500 MG tablet TAKE 2 TABLETS TWICE A DAY WITH MEALS 360 tablet 3  . Multiple Vitamins-Minerals (ONE-A-DAY MENS HEALTH FORMULA PO) Take 1 tablet by mouth daily.     . Omega-3 Krill Oil 500 MG CAPS Take 500 mg by mouth daily.     Marland Kitchen omeprazole (PRILOSEC) 40 MG capsule TAKE 1 CAPSULE DAILY 90 capsule 3   No current facility-administered medications on file prior to visit.   Allergies  Allergen Reactions  . Penicillins Anaphylaxis and Other (See Comments)    Has patient had a PCN reaction causing immediate rash, facial/tongue/throat swelling, SOB or lightheadedness with hypotension: Yes Has patient had a PCN reaction causing severe rash involving mucus membranes or skin necrosis: No Has patient had a PCN reaction that required hospitalization: Yes Has patient had a PCN reaction occurring within the last 10 years: No If all of the above answers are "NO", then may proceed with Cephalosporin use.   . Sulfa Antibiotics Swelling and Other (See Comments)    Childhood allergy- site of swelling not recalled  . Keflex [Cephalexin] Other (See Comments)    Childhood allergy- reaction not recalled   Social History   Socioeconomic History  . Marital status: Married    Spouse name: Not on file  . Number of children: 1  . Years of education: college  . Highest education level: Not on file  Occupational History  . Not on file  Tobacco Use  . Smoking status: Never Smoker  . Smokeless tobacco: Never Used  Vaping Use  . Vaping Use: Never used  Substance and Sexual Activity  . Alcohol use: Yes    Comment: RARE  . Drug use: No  . Sexual activity: Yes  Other  Topics Concern  . Not on file  Social History Narrative   Drinks about 1 cup of tea a day    Social Determinants of Health   Financial Resource Strain: Not on file  Food Insecurity: Not on file  Transportation Needs: Not on file  Physical Activity: Not on file  Stress: Not on file  Social Connections: Not on file  Intimate Partner Violence: Not on file      Review of Systems  Musculoskeletal: Positive  for back pain.  All other systems reviewed and are negative.      Objective:   Physical Exam Vitals reviewed.  Constitutional:      Appearance: Normal appearance. He is obese.  Cardiovascular:     Rate and Rhythm: Normal rate and regular rhythm.     Heart sounds: Normal heart sounds.  Pulmonary:     Effort: Pulmonary effort is normal. No respiratory distress.     Breath sounds: Normal breath sounds. No wheezing, rhonchi or rales.  Abdominal:     General: Abdomen is flat. Bowel sounds are normal. There is no distension.     Palpations: Abdomen is soft.     Tenderness: There is no abdominal tenderness. There is no guarding.  Musculoskeletal:     Left shoulder: No swelling, deformity, tenderness, bony tenderness or crepitus. Decreased range of motion. Normal strength.     Left knee: No deformity, effusion or erythema. Normal range of motion. Tenderness present over the lateral joint line.     Left foot: Normal range of motion. No swelling, deformity, tenderness, bony tenderness or crepitus.       Feet:  Neurological:     Mental Status: He is alert.           Assessment & Plan:  Controlled type 2 diabetes mellitus with complication, without long-term current use of insulin (HCC) - Plan: Hemoglobin A1c, CBC with Differential/Platelet, COMPLETE METABOLIC PANEL WITH GFR, Lipid panel  Acute pain of left knee  Subacromial bursitis of left shoulder joint  Contusion of left foot, initial encounter  While the patient is here today, I will check a CMP, fasting lipid  panel, and hemoglobin A1c.  His blood pressure today is well controlled.  Goal LDL cholesterol is less than 100.  Goal hemoglobin A1c is less than 6.5.  I believe he has subacromial bursitis for which we will try meloxicam 15 mg p.o. daily as needed.  I believe he has osteoarthritis in his left knee.  An addition to the meloxicam he would like to try cortisone injection in his left knee.  Using sterile technique, I injected the left knee with 2 cc of lidocaine, 2 cc of Marcaine, and 2 cc of 40 mg/mL Kenalog.  The patient tolerated the procedure well without complication.  I believe he suffered a contusion to the midfoot however I do not feel that there is any fracture.  He only feels tightness with standing.  We discussed an x-ray but he is comfortable just monitoring it for another few weeks unless the pain worsens.

## 2020-02-11 ENCOUNTER — Other Ambulatory Visit

## 2020-02-11 ENCOUNTER — Other Ambulatory Visit: Payer: Self-pay | Admitting: *Deleted

## 2020-02-11 DIAGNOSIS — D649 Anemia, unspecified: Secondary | ICD-10-CM

## 2020-02-12 LAB — COMPLETE METABOLIC PANEL WITH GFR
AG Ratio: 1.9 (calc) (ref 1.0–2.5)
ALT: 26 U/L (ref 9–46)
AST: 18 U/L (ref 10–35)
Albumin: 4.6 g/dL (ref 3.6–5.1)
Alkaline phosphatase (APISO): 98 U/L (ref 35–144)
BUN: 17 mg/dL (ref 7–25)
CO2: 23 mmol/L (ref 20–32)
Calcium: 9.8 mg/dL (ref 8.6–10.3)
Chloride: 99 mmol/L (ref 98–110)
Creat: 0.87 mg/dL (ref 0.70–1.33)
GFR, Est African American: 114 mL/min/{1.73_m2} (ref >=60)
GFR, Est Non African American: 99 mL/min/{1.73_m2} (ref >=60)
Globulin: 2.4 g/dL (calc) (ref 1.9–3.7)
Glucose, Bld: 104 mg/dL — ABNORMAL HIGH (ref 65–99)
Potassium: 4.2 mmol/L (ref 3.5–5.3)
Sodium: 138 mmol/L (ref 135–146)
Total Bilirubin: 0.4 mg/dL (ref 0.2–1.2)
Total Protein: 7 g/dL (ref 6.1–8.1)

## 2020-02-12 LAB — IRON,TIBC AND FERRITIN PANEL
%SAT: 5 % (calc) — ABNORMAL LOW (ref 20–48)
Ferritin: 5 ng/mL — ABNORMAL LOW (ref 38–380)
Iron: 26 ug/dL — ABNORMAL LOW (ref 50–180)
TIBC: 476 mcg/dL (calc) — ABNORMAL HIGH (ref 250–425)

## 2020-02-12 LAB — TEST AUTHORIZATION

## 2020-02-12 LAB — CBC WITH DIFFERENTIAL/PLATELET
Absolute Monocytes: 604 cells/uL (ref 200–950)
Basophils Absolute: 69 cells/uL (ref 0–200)
Basophils Relative: 0.7 %
Eosinophils Absolute: 366 cells/uL (ref 15–500)
Eosinophils Relative: 3.7 %
HCT: 37 % — ABNORMAL LOW (ref 38.5–50.0)
Hemoglobin: 11 g/dL — ABNORMAL LOW (ref 13.2–17.1)
Lymphs Abs: 1940 cells/uL (ref 850–3900)
MCH: 20.8 pg — ABNORMAL LOW (ref 27.0–33.0)
MCHC: 29.7 g/dL — ABNORMAL LOW (ref 32.0–36.0)
MCV: 69.8 fL — ABNORMAL LOW (ref 80.0–100.0)
MPV: 10.5 fL (ref 7.5–12.5)
Monocytes Relative: 6.1 %
Neutro Abs: 6920 cells/uL (ref 1500–7800)
Neutrophils Relative %: 69.9 %
Platelets: 544 10*3/uL — ABNORMAL HIGH (ref 140–400)
RBC: 5.3 10*6/uL (ref 4.20–5.80)
RDW: 18.2 % — ABNORMAL HIGH (ref 11.0–15.0)
Total Lymphocyte: 19.6 %
WBC: 9.9 10*3/uL (ref 3.8–10.8)

## 2020-02-12 LAB — CBC MORPHOLOGY

## 2020-02-12 LAB — HEMOGLOBIN A1C
Hgb A1c MFr Bld: 6.2 % of total Hgb — ABNORMAL HIGH (ref <5.7)
Mean Plasma Glucose: 131 mg/dL
eAG (mmol/L): 7.3 mmol/L

## 2020-02-12 LAB — LIPID PANEL
Cholesterol: 97 mg/dL (ref <200)
HDL: 36 mg/dL — ABNORMAL LOW (ref >=40)
LDL Cholesterol (Calc): 44 mg/dL (calc)
Non-HDL Cholesterol (Calc): 61 mg/dL (calc) (ref <130)
Total CHOL/HDL Ratio: 2.7 (calc) (ref <5.0)
Triglycerides: 83 mg/dL (ref <150)

## 2020-02-12 LAB — B12 AND FOLATE PANEL
Folate: 22.8 ng/mL
Vitamin B-12: 535 pg/mL (ref 200–1100)

## 2020-02-15 ENCOUNTER — Encounter: Payer: Self-pay | Admitting: Family Medicine

## 2020-02-15 LAB — FECAL GLOBIN BY IMMUNOCHEMISTRY
FECAL GLOBIN RESULT:: NOT DETECTED
MICRO NUMBER:: 11497017
SPECIMEN QUALITY:: ADEQUATE

## 2020-02-25 ENCOUNTER — Ambulatory Visit (INDEPENDENT_AMBULATORY_CARE_PROVIDER_SITE_OTHER): Admitting: Family Medicine

## 2020-02-25 ENCOUNTER — Other Ambulatory Visit: Payer: Self-pay

## 2020-02-25 ENCOUNTER — Encounter: Payer: Self-pay | Admitting: Family Medicine

## 2020-02-25 VITALS — BP 118/66 | HR 88 | Temp 98.3°F | Resp 14 | Ht 69.0 in | Wt 282.0 lb

## 2020-02-25 DIAGNOSIS — D509 Iron deficiency anemia, unspecified: Secondary | ICD-10-CM

## 2020-02-25 NOTE — Progress Notes (Signed)
Subjective:    Patient ID: Zachary Gillespie, male    DOB: Sep 26, 1965, 55 y.o.   MRN: [id number]  02/10/20 Patient is a very pleasant 55 year old Caucasian male who presents today with several complaints.  First he is having pain in his left shoulder.  He reports pain with abduction greater than 90 degrees.  He has some pain with internal rotation.  He has a positive empty can test.  He has a negative drop test.  He has minimal pain with Hawkins maneuver.  He has a negative O'Brien sign.  There is no crepitus with range of motion.  I believe his symptoms are more indicative of subacromial bursitis.  This is been going on for a few weeks.  He denies any falls or injuries.  He is also developing pain in his left knee.  The pain is primarily located over the lateral joint line.  It hurts when he is going up and down steps.  He denies any laxity to varus or valgus stress.  He denies any locking in the knee.  He has a negative anterior and posterior drawer sign today.  He has a negative Apley grind.  There is no effusion or erythema.  He also complains of pain with standing on the dorsum of his left foot.  3 weeks ago he dropped a heavy case on his foot.  He could instantly bear weight.  He denies any tenderness to palpation.  There is no bruising.  There is no swelling.  There is no palpable defect.  There is no erythema however there is some tightness when he stands in that area in the midfoot on the dorsum of the midfoot.  He is also due for fasting lab work to monitor his diabetes.  At that time, my plan was: While the patient is here today, I will check a CMP, fasting lipid panel, and hemoglobin A1c.  His blood pressure today is well controlled.  Goal LDL cholesterol is less than 100.  Goal hemoglobin A1c is less than 6.5.  I believe he has subacromial bursitis for which we will try meloxicam 15 mg p.o. daily as needed.  I believe he has osteoarthritis in his left knee.  An addition to the meloxicam he would  like to try cortisone injection in his left knee.  Using sterile technique, I injected the left knee with 2 cc of lidocaine, 2 cc of Marcaine, and 2 cc of 40 mg/mL Kenalog.  The patient tolerated the procedure well without complication.  I believe he suffered a contusion to the midfoot however I do not feel that there is any fracture.  He only feels tightness with standing.  We discussed an x-ray but he is comfortable just monitoring it for another few weeks unless the pain worsens.  Appointment on 02/11/2020  Component Date Value Ref Range Status  . MICRO NUMBER: 02/11/2020 [id number]   Final  . SPECIMEN QUALITY: 02/11/2020 Adequate   Final  . Source: 02/11/2020 INSURE (TM) FOBT TEST CARD   Final  . STATUS: 02/11/2020 FINAL   Final  . FECAL GLOBIN RESULT: 02/11/2020 Not Detected   Final  Office Visit on 02/10/2020  Component Date Value Ref Range Status  . Hgb A1c MFr Bld 02/10/2020 6.2* <5.7 % of total Hgb Final   Comment: For someone without known diabetes, a hemoglobin  A1c value between 5.7% and 6.4% is consistent with prediabetes and should be confirmed with a  follow-up test. . For someone with known diabetes, a  value <7% indicates that their diabetes is well controlled. A1c targets should be individualized based on duration of diabetes, age, comorbid conditions, and other considerations. . This assay result is consistent with an increased risk of diabetes. . Currently, no consensus exists regarding use of hemoglobin A1c for diagnosis of diabetes for children. .   . Mean Plasma Glucose 02/10/2020 131  mg/dL Final  . eAG (mmol/L) 02/10/2020 7.3  mmol/L Final  . WBC 02/10/2020 9.9  3.8 - 10.8 Thousand/uL Final  . RBC 02/10/2020 5.30  4.20 - 5.80 Million/uL Final  . Hemoglobin 02/10/2020 11.0* 13.2 - 17.1 g/dL Final  . HCT 02/10/2020 37.0* 38.5 - 50.0 % Final  . MCV 02/10/2020 69.8* 80.0 - 100.0 fL Final  . MCH 02/10/2020 20.8* 27.0 - 33.0 pg Final  . MCHC 02/10/2020 29.7* 32.0  - 36.0 g/dL Final  . RDW 02/10/2020 18.2* 11.0 - 15.0 % Final  . Platelets 02/10/2020 544* 140 - 400 Thousand/uL Final  . MPV 02/10/2020 10.5  7.5 - 12.5 fL Final  . Neutro Abs 02/10/2020 6,920  1,500 - 7,800 cells/uL Final  . Lymphs Abs 02/10/2020 1,940  850 - 3,900 cells/uL Final  . Absolute Monocytes 02/10/2020 604  200 - 950 cells/uL Final  . Eosinophils Absolute 02/10/2020 366  15 - 500 cells/uL Final  . Basophils Absolute 02/10/2020 69  0 - 200 cells/uL Final  . Neutrophils Relative % 02/10/2020 69.9  % Final  . Total Lymphocyte 02/10/2020 19.6  % Final  . Monocytes Relative 02/10/2020 6.1  % Final  . Eosinophils Relative 02/10/2020 3.7  % Final  . Basophils Relative 02/10/2020 0.7  % Final  . Glucose, Bld 02/10/2020 104* 65 - 99 mg/dL Final   Comment: .            Fasting reference interval . For someone without known diabetes, a glucose value between 100 and 125 mg/dL is consistent with prediabetes and should be confirmed with a follow-up test. .   . BUN 02/10/2020 17  7 - 25 mg/dL Final  . Creat 02/10/2020 0.87  0.70 - 1.33 mg/dL Final   Comment: For patients >67 years of age, the reference limit for Creatinine is approximately 13% higher for people identified as African-American. .   . GFR, Est Non African American 02/10/2020 99  > OR = 60 mL/min/1.31m2 Final  . GFR, Est African American 02/10/2020 114  > OR = 60 mL/min/1.58m2 Final  . BUN/Creatinine Ratio 93/81/0175 NOT APPLICABLE  6 - 22 (calc) Final  . Sodium 02/10/2020 138  135 - 146 mmol/L Final  . Potassium 02/10/2020 4.2  3.5 - 5.3 mmol/L Final  . Chloride 02/10/2020 99  98 - 110 mmol/L Final  . CO2 02/10/2020 23  20 - 32 mmol/L Final  . Calcium 02/10/2020 9.8  8.6 - 10.3 mg/dL Final  . Total Protein 02/10/2020 7.0  6.1 - 8.1 g/dL Final  . Albumin 02/10/2020 4.6  3.6 - 5.1 g/dL Final  . Globulin 02/10/2020 2.4  1.9 - 3.7 g/dL (calc) Final  . AG Ratio 02/10/2020 1.9  1.0 - 2.5 (calc) Final  . Total Bilirubin  02/10/2020 0.4  0.2 - 1.2 mg/dL Final  . Alkaline phosphatase (APISO) 02/10/2020 98  35 - 144 U/L Final  . AST 02/10/2020 18  10 - 35 U/L Final  . ALT 02/10/2020 26  9 - 46 U/L Final  . Cholesterol 02/10/2020 97  <200 mg/dL Final  . HDL 02/10/2020 36* > OR = 40  mg/dL Final  . Triglycerides 02/10/2020 83  <150 mg/dL Final  . LDL Cholesterol (Calc) 02/10/2020 44  mg/dL (calc) Final   Comment: Reference range: <100 . Desirable range <100 mg/dL for primary prevention;   <70 mg/dL for patients with CHD or diabetic patients  with > or = 2 CHD risk factors. Marland Kitchen LDL-C is now calculated using the Martin-Hopkins  calculation, which is a validated novel method providing  better accuracy than the Friedewald equation in the  estimation of LDL-C.  Cresenciano Genre et al. Annamaria Helling. 5009;381(82): 2061-2068  (http://education.[facility].com/faq/FAQ164)   . Total CHOL/HDL Ratio 02/10/2020 2.7  <5.0 (calc) Final  . Non-HDL Cholesterol (Calc) 02/10/2020 61  <130 mg/dL (calc) Final   Comment: For patients with diabetes plus 1 major ASCVD risk  factor, treating to a non-HDL-C goal of <100 mg/dL  (LDL-C of <70 mg/dL) is considered a therapeutic  option.   . CBC MORPHOLOGY 02/10/2020   NORMAL Final   Comment: Crenated red blood cells 2 + Poikilocytosis 2 +   . Iron 02/10/2020 26* 50 - 180 mcg/dL Final  . TIBC 02/10/2020 476* 250 - 425 mcg/dL (calc) Final  . %SAT 02/10/2020 5* 20 - 48 % (calc) Final  . Ferritin 02/10/2020 5* 38 - 380 ng/mL Final  . Vitamin B-12 02/10/2020 535  200 - 1,100 pg/mL Final  . Folate 02/10/2020 22.8  ng/mL Final   Comment:                            Reference Range                            Low:           <3.4                            Borderline:    3.4-5.4                            Normal:        >5.4 .   Marland Kitchen TEST NAME: 02/10/2020 IRON, TIBC AND FERRITIN PANEL   Final  . TEST CODE: 02/10/2020 [id number]   Final  . CLIENT CONTACT: 02/10/2020 Learta Codding   Final  .  REPORT ALWAYS MESSAGE SIGNATURE 02/10/2020    Final   Comment: . The laboratory testing on this patient was verbally requested or confirmed by the ordering physician or his or her authorized representative after contact with an employee of [employer]. Federal regulations require that we maintain on file written authorization for all laboratory testing.  Accordingly we are asking that the ordering physician or his or her authorized representative sign a copy of this report and promptly return it to the client service representative. . . Signature:____________________________________________________ . Please fax this signed page to [phone] or return it via your [employer] courier.     02/25/20 On the patient's last lab work, his hemoglobin was found to be low at 11.  His MCV was 69 confirming microcytic anemia.  I checked a B12 level which was normal.  I checked the folic acid level which was normal.  Iron level was very low as well as percent saturation suggesting iron deficiency anemia.  Patient is performed 1 fecal occult blood test that was negative thus far Past Medical History:  Diagnosis Date  . DDD (degenerative disc disease), lumbar   . Dyslipidemia   . GERD (gastroesophageal reflux disease)   . History of hiatal hernia   . History of kidney stones    H/O  . HLD (hyperlipidemia)   . Hypertension   . Obesity   . Prediabetes   . Sciatica of left side   . Sleep apnea    AHI 34  . Tubular adenoma of colon    2019 (Dr. Benson Norway)   Past Surgical History:  Procedure Laterality Date  . AMPUTATION Left 05/16/2018   Procedure: Ray Resection Left Index Finger;  Surgeon: [name], MD;  Location: [place];  Service: Orthopedics;  Laterality: Left;  . COLONOSCOPY    . ESOPHAGOGASTRODUODENOSCOPY    . EXTRACORPOREAL SHOCK WAVE LITHOTRIPSY Left 07/24/2017   Procedure: EXTRACORPOREAL SHOCK WAVE LITHOTRIPSY (ESWL);  Surgeon: [name], MD;  Location: [facility];   Service: Urology;  Laterality: Left;  . EXTRACORPOREAL SHOCK WAVE LITHOTRIPSY Right 12/11/2017   Procedure: EXTRACORPOREAL SHOCK WAVE LITHOTRIPSY (ESWL);  Surgeon: [name], MD;  Location: [facility];  Service: Urology;  Laterality: Right;  . EYE SURGERY    . INGUINAL HERNIA REPAIR Right 08/08/2017   Procedure: LAPAROSCOPIC INGUINAL HERNIA;  Surgeon: [name], MD;  Location: [facility];  Service: General;  Laterality: Right;  . NERVE REPAIR Left 05/16/2018   Procedure: Cutaneous Nerve Repair;  Surgeon: [name], MD;  Location: [place];  Service: Orthopedics;  Laterality: Left;  . SKIN FULL THICKNESS GRAFT Left 05/16/2018   Procedure: Skin Graft Full Thickness;  Surgeon: [name], MD;  Location: [place];  Service: Orthopedics;  Laterality: Left;  . TENDON REPAIR Left 05/16/2018   Procedure: Extensor Tendon Repair;  Surgeon: [name], MD;  Location: [place];  Service: Orthopedics;  Laterality: Left;  . TONSILLECTOMY    . VASECTOMY     Current Outpatient Medications on File Prior to Visit  Medication Sig Dispense Refill  . aspirin EC 81 MG tablet Take 81 mg by mouth at bedtime.     Marland Kitchen atorvastatin (LIPITOR) 40 MG tablet TAKE 1 TABLET DAILY 90 tablet 3  . BD PEN NEEDLE MICRO U/F 32G X 6 MM MISC USE TO INJECT SAXENDA UNDER THE SKIN DAILY AS DIRECTED 100 each 3  . escitalopram (LEXAPRO) 10 MG tablet TAKE 1 TABLET DAILY 90 tablet 3  . fluticasone (FLONASE) 50 MCG/ACT nasal spray USE 2 SPRAYS IN EACH NOSTRIL DAILY 48 g 3  . hydrochlorothiazide (HYDRODIURIL) 25 MG tablet TAKE 1 TABLET DAILY 90 tablet 3  . Insulin Pen Needle (RELION PEN NEEDLES) 32G X 4 MM MISC Use daily with Saxenda 100 each 3  . Liraglutide -Weight Management (SAXENDA) 18 MG/3ML SOPN Inject 0.5 mLs (3 mg total) into the skin daily. 5 pen 3  . losartan (COZAAR) 100 MG tablet TAKE 1 TABLET DAILY 90 tablet 3  . Melatonin 10 MG TABS Take 10 mg by mouth at bedtime.     . meloxicam (MOBIC) 15 MG tablet Take 1 tablet (15 mg  total) by mouth daily. 30 tablet 0  . metFORMIN (GLUCOPHAGE) 500 MG tablet TAKE 2 TABLETS TWICE A DAY WITH MEALS 360 tablet 3  . Multiple Vitamins-Minerals (ONE-A-DAY MENS HEALTH FORMULA PO) Take 1 tablet by mouth daily.     . Omega-3 Krill Oil 500 MG CAPS Take 500 mg by mouth daily.     Marland Kitchen omeprazole (PRILOSEC) 40 MG capsule TAKE 1 CAPSULE DAILY 90 capsule 3  No current facility-administered medications on file prior to visit.   Allergies  Allergen Reactions  . Penicillins Anaphylaxis and Other (See Comments)    Has patient had a PCN reaction causing immediate rash, facial/tongue/throat swelling, SOB or lightheadedness with hypotension: Yes Has patient had a PCN reaction causing severe rash involving mucus membranes or skin necrosis: No Has patient had a PCN reaction that required hospitalization: Yes Has patient had a PCN reaction occurring within the last 10 years: No If all of the above answers are "NO", then may proceed with Cephalosporin use.   . Sulfa Antibiotics Swelling and Other (See Comments)    Childhood allergy- site of swelling not recalled  . Keflex [Cephalexin] Other (See Comments)    Childhood allergy- reaction not recalled   Social History   Socioeconomic History  . Marital status: Married    Spouse name: Not on file  . Number of children: 1  . Years of education: college  . Highest education level: Not on file  Occupational History  . Not on file  Tobacco Use  . Smoking status: Never Smoker  . Smokeless tobacco: Never Used  Vaping Use  . Vaping Use: Never used  Substance and Sexual Activity  . Alcohol use: Yes    Comment: RARE  . Drug use: No  . Sexual activity: Yes  Other Topics Concern  . Not on file  Social History Narrative   Drinks about 1 cup of tea a day    Social Determinants of Health   Financial Resource Strain: Not on file  Food Insecurity: Not on file  Transportation Needs: Not on file  Physical Activity: Not on file  Stress: Not on  file  Social Connections: Not on file  Intimate Partner Violence: Not on file      Review of Systems  Musculoskeletal: Positive for back pain.  All other systems reviewed and are negative.      Objective:   Physical Exam Vitals reviewed.  Constitutional:      Appearance: Normal appearance. He is obese.  Cardiovascular:     Rate and Rhythm: Normal rate and regular rhythm.     Heart sounds: Normal heart sounds.  Pulmonary:     Effort: Pulmonary effort is normal. No respiratory distress.     Breath sounds: Normal breath sounds. No wheezing, rhonchi or rales.  Abdominal:     General: Abdomen is flat. Bowel sounds are normal. There is no distension.     Palpations: Abdomen is soft.     Tenderness: There is no abdominal tenderness. There is no guarding.  Musculoskeletal:     Left foot: Normal range of motion. No swelling, deformity, tenderness, bony tenderness or crepitus.  Neurological:     Mental Status: He is alert.           Assessment & Plan:  Iron deficiency anemia, unspecified iron deficiency anemia type - Plan: Fecal Globin By Immunochemistry, Fecal Globin By Immunochemistry  Patient has microcytic iron deficiency anemia.  Start ferrous sulfate 325 mg daily and recheck CBC in 1 month.  Patient had a colonoscopy as well as an EGD performed in 2019.  I will check his stool for blood 2 additional times to rule out an occult GI bleed.  If this is negative, hopefully we can correct his anemia simply with iron replacement.  If blood is found in his stool, I will consult GI.

## 2020-02-29 ENCOUNTER — Other Ambulatory Visit

## 2020-02-29 ENCOUNTER — Other Ambulatory Visit: Payer: Self-pay

## 2020-03-01 LAB — FECAL GLOBIN BY IMMUNOCHEMISTRY
FECAL GLOBIN RESULT:: NOT DETECTED
MICRO NUMBER:: 11563987
SPECIMEN QUALITY:: ADEQUATE

## 2020-03-05 ENCOUNTER — Other Ambulatory Visit: Payer: Self-pay | Admitting: Family Medicine

## 2020-03-13 ENCOUNTER — Telehealth: Payer: Self-pay | Admitting: Family Medicine

## 2020-03-13 MED ORDER — MELOXICAM 15 MG PO TABS: 15.0000 mg | ORAL_TABLET | Freq: Every day | ORAL | 0 refills | Status: DC

## 2020-03-13 NOTE — Telephone Encounter (Signed)
Received fax from [facility] requesting a refill on meloxicam (MOBIC) 15 MG tablet

## 2020-03-15 ENCOUNTER — Encounter: Payer: Self-pay | Admitting: Family Medicine

## 2020-03-15 ENCOUNTER — Other Ambulatory Visit: Payer: Self-pay | Admitting: Family Medicine

## 2020-03-28 ENCOUNTER — Other Ambulatory Visit: Payer: Self-pay | Admitting: Family Medicine

## 2020-04-10 ENCOUNTER — Other Ambulatory Visit: Payer: Self-pay | Admitting: Family Medicine

## 2020-04-20 ENCOUNTER — Other Ambulatory Visit: Payer: Self-pay | Admitting: Family Medicine

## 2020-04-20 DIAGNOSIS — I10 Essential (primary) hypertension: Secondary | ICD-10-CM

## 2020-04-28 ENCOUNTER — Other Ambulatory Visit: Payer: Self-pay | Admitting: Family Medicine

## 2020-05-08 ENCOUNTER — Other Ambulatory Visit: Payer: Self-pay | Admitting: Family Medicine

## 2020-05-12 ENCOUNTER — Telehealth: Payer: Self-pay | Admitting: *Deleted

## 2020-05-12 NOTE — Telephone Encounter (Signed)
Received request from pharmacy for PA on Saxenda.    PA submitted.   Dx: E66.09- obesity,     Weight (lbs) BMI (%)  05/19/2017 275 40.61  08/21/2017 258 38.1  06/18/2018 275 40.61  11/20/2018 299 44.15  02/11/2019 282 41.64  03/11/2019 281 41.5  07/22/2019 281 41.5  11/12/2019 286 42.23  02/10/2020 282 41.64  02/25/2020 282 41.64      

## 2020-05-12 NOTE — Telephone Encounter (Signed)
Received determination.   Message from [employer] An active PA is already on file with expiration date of 01/06/2098. Please wait to resubmit request within 60 days of that expiration date to obtain a PA renewal.

## 2020-05-26 ENCOUNTER — Encounter: Payer: Self-pay | Admitting: Family Medicine

## 2020-06-01 ENCOUNTER — Encounter: Payer: Self-pay | Admitting: Family Medicine

## 2020-06-19 ENCOUNTER — Other Ambulatory Visit: Payer: Self-pay | Admitting: Family Medicine

## 2020-06-23 LAB — VITAMIN B12

## 2020-06-23 LAB — IRON,TIBC AND FERRITIN PANEL

## 2020-06-23 LAB — SARS-COV-2 RNA,(COVID-19) QUALITATIVE NAAT: SARS CoV2 RNA: NOT DETECTED

## 2020-06-23 LAB — TIQ-NTM

## 2020-07-12 ENCOUNTER — Encounter: Payer: Self-pay | Admitting: *Deleted

## 2020-07-14 ENCOUNTER — Telehealth: Payer: Self-pay | Admitting: *Deleted

## 2020-07-14 NOTE — Telephone Encounter (Signed)
Received request from pharmacy for [place] on Saxenda.    PA submitted.   Dx: E66.09- obesity,     Weight (lbs) BMI (%)  05/19/2017 275 40.61  08/21/2017 258 38.1  06/18/2018 275 40.61  11/20/2018 299 44.15  02/11/2019 282 41.64  03/11/2019 281 41.5  07/22/2019 281 41.5  11/12/2019 286 42.23  02/10/2020 282 41.64  02/25/2020 282 41.64

## 2020-07-14 NOTE — Telephone Encounter (Signed)
[employer] is reviewing your PA request and will respond within 24 hours for Medicaid or up to 72 hours for non-Medicaid plans, based on the required timeframe determined by state or federal regulations.

## 2020-07-17 ENCOUNTER — Encounter: Payer: Self-pay | Admitting: Family Medicine

## 2020-07-17 MED ORDER — SAXENDA 18 MG/3ML ~~LOC~~ SOPN: PEN_INJECTOR | SUBCUTANEOUS | 3 refills | Status: DC

## 2020-07-19 NOTE — Telephone Encounter (Signed)
Received PA determination.   PA [id number] approved 06/14/2020- 07/14/2021.

## 2020-08-01 ENCOUNTER — Encounter: Payer: Self-pay | Admitting: Family Medicine

## 2020-08-01 DIAGNOSIS — H9193 Unspecified hearing loss, bilateral: Secondary | ICD-10-CM

## 2020-08-03 ENCOUNTER — Other Ambulatory Visit: Payer: Self-pay

## 2020-08-03 ENCOUNTER — Encounter: Payer: Self-pay | Admitting: Family Medicine

## 2020-08-03 ENCOUNTER — Ambulatory Visit (INDEPENDENT_AMBULATORY_CARE_PROVIDER_SITE_OTHER): Admitting: Family Medicine

## 2020-08-03 VITALS — BP 126/80 | HR 84 | Temp 98.4°F | Resp 14 | Ht 69.0 in | Wt 286.0 lb

## 2020-08-03 DIAGNOSIS — E118 Type 2 diabetes mellitus with unspecified complications: Secondary | ICD-10-CM

## 2020-08-03 DIAGNOSIS — M25512 Pain in left shoulder: Secondary | ICD-10-CM | POA: Diagnosis not present

## 2020-08-03 NOTE — Progress Notes (Signed)
Subjective:    Patient ID: Zachary Gillespie, male    DOB: 27-Sep-1965, 55 y.o.   MRN: [id number]  Few days ago, the patient was unloading a trailer.  A stack full of folding tables lost his balance and fell on the patient.  As it fell, he tried to catch it with his left arm causing a forceful downward jerking motion on his fully extended left arm.  Since that time he has had pain near the subacromial bursa.  It hurts to abduct his arm beyond 90 degrees.  It hurts with passive internal and external rotation.  It hurts with passive abduction greater than 90 degrees.  He has pain with empty can testing.  Drop test elicits some pain.  He has strength however it is slightly diminished compared to the right shoulder.  There is no crepitus with range of motion.  He also sustained a contusion to his left knee and his left foot but he states that those are improving Past Medical History:  Diagnosis Date   DDD (degenerative disc disease), lumbar    Dyslipidemia    GERD (gastroesophageal reflux disease)    History of hiatal hernia    History of kidney stones    H/O   HLD (hyperlipidemia)    Hypertension    Obesity    Prediabetes    Sciatica of left side    Sleep apnea    AHI 34   Tubular adenoma of colon    2019 (Dr. Benson Norway)   Past Surgical History:  Procedure Laterality Date   AMPUTATION Left 05/16/2018   Procedure: Ray Resection Left Index Finger;  Surgeon: [name], MD;  Location: [place];  Service: Orthopedics;  Laterality: Left;   COLONOSCOPY     ESOPHAGOGASTRODUODENOSCOPY     EXTRACORPOREAL SHOCK WAVE LITHOTRIPSY Left 07/24/2017   Procedure: EXTRACORPOREAL SHOCK WAVE LITHOTRIPSY (ESWL);  Surgeon: [name], MD;  Location: [facility];  Service: Urology;  Laterality: Left;   EXTRACORPOREAL SHOCK WAVE LITHOTRIPSY Right 12/11/2017   Procedure: EXTRACORPOREAL SHOCK WAVE LITHOTRIPSY (ESWL);  Surgeon: [name], MD;  Location: [facility];  Service: Urology;  Laterality: Right;   EYE SURGERY      INGUINAL HERNIA REPAIR Right 08/08/2017   Procedure: LAPAROSCOPIC INGUINAL HERNIA;  Surgeon: [name], MD;  Location: [facility];  Service: General;  Laterality: Right;   NERVE REPAIR Left 05/16/2018   Procedure: Cutaneous Nerve Repair;  Surgeon: [name], MD;  Location: [place];  Service: Orthopedics;  Laterality: Left;   SKIN FULL THICKNESS GRAFT Left 05/16/2018   Procedure: Skin Graft Full Thickness;  Surgeon: [name], MD;  Location: [place];  Service: Orthopedics;  Laterality: Left;   TENDON REPAIR Left 05/16/2018   Procedure: Extensor Tendon Repair;  Surgeon: [name], MD;  Location: [place];  Service: Orthopedics;  Laterality: Left;   TONSILLECTOMY     VASECTOMY     Current Outpatient Medications on File Prior to Visit  Medication Sig Dispense Refill   aspirin EC 81 MG tablet Take 81 mg by mouth at bedtime.      atorvastatin (LIPITOR) 40 MG tablet TAKE 1 TABLET DAILY 90 tablet 3   BD PEN NEEDLE MICRO U/F 32G X 6 MM MISC USE TO INJECT SAXENDA UNDER THE SKIN DAILY AS DIRECTED 100 each 3   escitalopram (LEXAPRO) 10 MG tablet TAKE 1 TABLET DAILY 90 tablet 3   fluticasone (FLONASE) 50 MCG/ACT nasal spray USE 2 SPRAYS IN EACH NOSTRIL DAILY 48 g 3  hydrochlorothiazide (HYDRODIURIL) 25 MG tablet TAKE 1 TABLET DAILY 90 tablet 3   Liraglutide -Weight Management (SAXENDA) 18 MG/3ML SOPN INJECT 1/2 ML SUBCUTANEOUSLY ONCE A DAY ('3MG'$  TOTAL) 15 mL 3   losartan (COZAAR) 100 MG tablet TAKE 1 TABLET DAILY 90 tablet 3   Melatonin 10 MG TABS Take 10 mg by mouth at bedtime.      meloxicam (MOBIC) 15 MG tablet TAKE 1 TABLET DAILY 30 tablet 11   metFORMIN (GLUCOPHAGE) 500 MG tablet TAKE 2 TABLETS TWICE A DAY WITH MEALS 360 tablet 3   Multiple Vitamins-Minerals (ONE-A-DAY MENS HEALTH FORMULA PO) Take 1 tablet by mouth daily.      Omega-3 Krill Oil 500 MG CAPS Take 500 mg by mouth daily.      omeprazole (PRILOSEC) 40 MG capsule TAKE 1 CAPSULE DAILY 90 capsule 3   No current facility-administered  medications on file prior to visit.   Allergies  Allergen Reactions   Penicillins Anaphylaxis and Other (See Comments)    Has patient had a PCN reaction causing immediate rash, facial/tongue/throat swelling, SOB or lightheadedness with hypotension: Yes Has patient had a PCN reaction causing severe rash involving mucus membranes or skin necrosis: No Has patient had a PCN reaction that required hospitalization: Yes Has patient had a PCN reaction occurring within the last 10 years: No If all of the above answers are "NO", then may proceed with Cephalosporin use.    Sulfa Antibiotics Swelling and Other (See Comments)    Childhood allergy- site of swelling not recalled   Keflex [Cephalexin] Other (See Comments)    Childhood allergy- reaction not recalled   Social History   Socioeconomic History   Marital status: Married    Spouse name: Not on file   Number of children: 1   Years of education: college   Highest education level: Not on file  Occupational History   Not on file  Tobacco Use   Smoking status: Never   Smokeless tobacco: Never  Vaping Use   Vaping Use: Never used  Substance and Sexual Activity   Alcohol use: Yes    Comment: RARE   Drug use: No   Sexual activity: Yes  Other Topics Concern   Not on file  Social History Narrative   Drinks about 1 cup of tea a day    Social Determinants of Health   Financial Resource Strain: Not on file  Food Insecurity: Not on file  Transportation Needs: Not on file  Physical Activity: Not on file  Stress: Not on file  Social Connections: Not on file  Intimate Partner Violence: Not on file      Review of Systems  Musculoskeletal:  Positive for back pain.  All other systems reviewed and are negative.     Objective:   Physical Exam Vitals reviewed.  Constitutional:      Appearance: Normal appearance. He is obese.  Cardiovascular:     Rate and Rhythm: Normal rate and regular rhythm.     Heart sounds: Normal heart  sounds.  Pulmonary:     Effort: Pulmonary effort is normal. No respiratory distress.     Breath sounds: Normal breath sounds. No wheezing, rhonchi or rales.  Abdominal:     General: Abdomen is flat. Bowel sounds are normal. There is no distension.     Palpations: Abdomen is soft.     Tenderness: There is no abdominal tenderness. There is no guarding.  Musculoskeletal:     Left shoulder: Tenderness present. No swelling, deformity,  effusion or bony tenderness. Decreased range of motion. Decreased strength.     Left foot: Normal range of motion. No swelling, deformity, tenderness, bony tenderness or crepitus.  Neurological:     Mental Status: He is alert.          Assessment & Plan:  Controlled type 2 diabetes mellitus with complication, without long-term current use of insulin ([place]) - Plan: Hemoglobin A1c, CBC with Differential/Platelet, COMPLETE METABOLIC PANEL WITH GFR  Acute pain of left shoulder While the patient is here and we will recheck his lab work.  I believe the patient likely has either subacromial bursitis/supraspinatus tendinitis versus a partial tear.  Using sterile technique, we injected the subacromial space with a mixture of 2 cc lidocaine, 2 cc of Marcaine, and 2 cc of 40 mg/mL Kenalog.  Patient tolerated the procedure well without complication.  Recheck if pain does not improve or worsens.

## 2020-08-04 ENCOUNTER — Encounter: Payer: Self-pay | Admitting: Family Medicine

## 2020-08-04 LAB — COMPLETE METABOLIC PANEL WITH GFR
AG Ratio: 2 (calc) (ref 1.0–2.5)
ALT: 38 U/L (ref 9–46)
AST: 21 U/L (ref 10–35)
Albumin: 4.5 g/dL (ref 3.6–5.1)
Alkaline phosphatase (APISO): 82 U/L (ref 35–144)
BUN: 15 mg/dL (ref 7–25)
CO2: 25 mmol/L (ref 20–32)
Calcium: 10 mg/dL (ref 8.6–10.3)
Chloride: 105 mmol/L (ref 98–110)
Creat: 0.73 mg/dL (ref 0.70–1.30)
Globulin: 2.3 g/dL (calc) (ref 1.9–3.7)
Glucose, Bld: 114 mg/dL — ABNORMAL HIGH (ref 65–99)
Potassium: 3.9 mmol/L (ref 3.5–5.3)
Sodium: 142 mmol/L (ref 135–146)
Total Bilirubin: 0.3 mg/dL (ref 0.2–1.2)
Total Protein: 6.8 g/dL (ref 6.1–8.1)
eGFR: 109 mL/min/{1.73_m2} (ref >=60)

## 2020-08-04 LAB — CBC WITH DIFFERENTIAL/PLATELET
Absolute Monocytes: 564 cells/uL (ref 200–950)
Basophils Absolute: 72 cells/uL (ref 0–200)
Basophils Relative: 0.6 %
Eosinophils Absolute: 324 cells/uL (ref 15–500)
Eosinophils Relative: 2.7 %
HCT: 41.7 % (ref 38.5–50.0)
Hemoglobin: 12.8 g/dL — ABNORMAL LOW (ref 13.2–17.1)
Lymphs Abs: 2208 cells/uL (ref 850–3900)
MCH: 23.9 pg — ABNORMAL LOW (ref 27.0–33.0)
MCHC: 30.7 g/dL — ABNORMAL LOW (ref 32.0–36.0)
MCV: 77.8 fL — ABNORMAL LOW (ref 80.0–100.0)
MPV: 11.8 fL (ref 7.5–12.5)
Monocytes Relative: 4.7 %
Neutro Abs: 8832 cells/uL — ABNORMAL HIGH (ref 1500–7800)
Neutrophils Relative %: 73.6 %
Platelets: 371 10*3/uL (ref 140–400)
RBC: 5.36 10*6/uL (ref 4.20–5.80)
RDW: 17.2 % — ABNORMAL HIGH (ref 11.0–15.0)
Total Lymphocyte: 18.4 %
WBC: 12 10*3/uL — ABNORMAL HIGH (ref 3.8–10.8)

## 2020-08-04 LAB — HEMOGLOBIN A1C
Hgb A1c MFr Bld: 6.7 % of total Hgb — ABNORMAL HIGH (ref <5.7)
Mean Plasma Glucose: 146 mg/dL
eAG (mmol/L): 8.1 mmol/L

## 2020-08-06 ENCOUNTER — Encounter: Payer: Self-pay | Admitting: Family Medicine

## 2020-08-07 ENCOUNTER — Other Ambulatory Visit: Payer: Self-pay | Admitting: Family Medicine

## 2020-08-07 ENCOUNTER — Other Ambulatory Visit: Payer: Self-pay | Admitting: *Deleted

## 2020-08-07 ENCOUNTER — Other Ambulatory Visit: Payer: Self-pay

## 2020-08-07 ENCOUNTER — Other Ambulatory Visit

## 2020-08-07 DIAGNOSIS — M75102 Unspecified rotator cuff tear or rupture of left shoulder, not specified as traumatic: Secondary | ICD-10-CM

## 2020-08-07 DIAGNOSIS — M12812 Other specific arthropathies, not elsewhere classified, left shoulder: Secondary | ICD-10-CM

## 2020-08-07 DIAGNOSIS — D649 Anemia, unspecified: Secondary | ICD-10-CM

## 2020-08-08 ENCOUNTER — Ambulatory Visit
Admission: RE | Admit: 2020-08-08 | Discharge: 2020-08-08 | Disposition: A | Discharge status: Home / Self Care | Source: Ambulatory Visit | Attending: Family Medicine | Admitting: Family Medicine

## 2020-08-08 DIAGNOSIS — M12812 Other specific arthropathies, not elsewhere classified, left shoulder: Secondary | ICD-10-CM

## 2020-08-08 LAB — IRON,TIBC AND FERRITIN PANEL
%SAT: 6 % (calc) — ABNORMAL LOW (ref 20–48)
Ferritin: 9 ng/mL — ABNORMAL LOW (ref 38–380)
Iron: 26 ug/dL — ABNORMAL LOW (ref 50–180)
TIBC: 459 mcg/dL (calc) — ABNORMAL HIGH (ref 250–425)

## 2020-08-09 ENCOUNTER — Encounter: Payer: Self-pay | Admitting: Family Medicine

## 2020-08-09 LAB — FECAL GLOBIN BY IMMUNOCHEMISTRY
FECAL GLOBIN RESULT:: DETECTED — AB
MICRO NUMBER:: 12191209
SPECIMEN QUALITY:: ADEQUATE

## 2020-08-10 ENCOUNTER — Other Ambulatory Visit: Payer: Self-pay | Admitting: Family Medicine

## 2020-08-10 ENCOUNTER — Ambulatory Visit: Admitting: Family Medicine

## 2020-08-10 ENCOUNTER — Encounter: Payer: Self-pay | Admitting: Family Medicine

## 2020-08-10 DIAGNOSIS — M75102 Unspecified rotator cuff tear or rupture of left shoulder, not specified as traumatic: Secondary | ICD-10-CM

## 2020-08-10 DIAGNOSIS — R195 Other fecal abnormalities: Secondary | ICD-10-CM

## 2020-08-14 ENCOUNTER — Other Ambulatory Visit: Payer: Self-pay | Admitting: Family Medicine

## 2020-08-14 DIAGNOSIS — I10 Essential (primary) hypertension: Secondary | ICD-10-CM

## 2020-08-15 ENCOUNTER — Encounter: Payer: Self-pay | Admitting: Family Medicine

## 2020-08-17 ENCOUNTER — Other Ambulatory Visit: Payer: Self-pay | Admitting: Family Medicine

## 2020-08-17 ENCOUNTER — Encounter: Payer: Self-pay | Admitting: Family Medicine

## 2020-08-17 MED ORDER — AZITHROMYCIN 250 MG PO TABS: ORAL_TABLET | ORAL | 0 refills | Status: DC

## 2020-08-20 ENCOUNTER — Ambulatory Visit
Admission: RE | Admit: 2020-08-20 | Discharge: 2020-08-20 | Disposition: A | Discharge status: Home / Self Care | Source: Ambulatory Visit | Attending: Emergency Medicine | Admitting: Emergency Medicine

## 2020-08-20 ENCOUNTER — Other Ambulatory Visit: Payer: Self-pay

## 2020-08-20 VITALS — BP 119/79 | HR 86 | Temp 98.8°F | Resp 20

## 2020-08-20 DIAGNOSIS — Z20822 Contact with and (suspected) exposure to covid-19: Secondary | ICD-10-CM

## 2020-08-20 DIAGNOSIS — B349 Viral infection, unspecified: Secondary | ICD-10-CM | POA: Diagnosis not present

## 2020-08-20 MED ORDER — BENZONATATE 100 MG PO CAPS: 100.0000 mg | ORAL_CAPSULE | Freq: Three times a day (TID) | ORAL | 0 refills | Status: DC | PRN

## 2020-08-20 NOTE — ED Triage Notes (Signed)
Pt currently being treated for pneumonia, but for the last 2 days has experienced bad headache that he is unable to get rid of with OTC meds. States it starts at base of neck and runs up through left temple.

## 2020-08-20 NOTE — Discharge Instructions (Addendum)
Your COVID test is pending.  You should self quarantine until the test result is back.    Take the [facility] as needed for cough.    Be careful taking over-the-counter products that raise your blood pressure.  Take Tylenol or ibuprofen as needed for fever or discomfort.  Rest and keep yourself hydrated.    Follow-up with your primary care provider if your symptoms are not improving.

## 2020-08-20 NOTE — ED Provider Notes (Signed)
Zachary Gillespie    CSN: [id number] Arrival date & time: 08/20/20  [id number]      History   Chief Complaint Chief Complaint  Patient presents with   Headache    HPI Zachary Gillespie is a 55 y.o. male.  Patient presents with 2-day history of headache.  He also reports congestion and cough x 5 days.  He denies fever, chills, shortness of breath, vomiting, diarrhea, or other symptoms.  Patient was prescribed Zithromax for a cough on 08/17/2020 after sending his PCP a message through [place]; he was not seen in person.  Treatment at home today with Mucinex DM; no Tylenol or ibuprofen taken today.  His medical history includes obesity, hypertension, prediabetes, sleep apnea, GERD, DDD.  The history is provided by the patient and medical records.   Past Medical History:  Diagnosis Date   DDD (degenerative disc disease), lumbar    Dyslipidemia    GERD (gastroesophageal reflux disease)    History of hiatal hernia    History of kidney stones    H/O   HLD (hyperlipidemia)    Hypertension    Obesity    Prediabetes    Sciatica of left side    Sleep apnea    AHI 34   Tubular adenoma of colon    2019 (Dr. Benson Norway)    Patient Active Problem List   Diagnosis Date Noted   Moderate single current episode of major depressive disorder ([place]) 07/22/2019   Joint stiffness of hand, left 10/07/2018   Sleep apnea    At risk for obstructive sleep apnea 06/10/2018   Non-recurrent unilateral inguinal hernia without obstruction or gangrene    Calculus of ureter 07/20/2017   Nephrolithiasis 07/20/2017   Prediabetes    HLD (hyperlipidemia)    Obesity    Hypertension    DDD (degenerative disc disease), lumbar    GERD (gastroesophageal reflux disease)    Sciatica of left side    Dyslipidemia     Past Surgical History:  Procedure Laterality Date   AMPUTATION Left 05/16/2018   Procedure: Ray Resection Left Index Finger;  Surgeon: [name], MD;  Location: [place];  Service: Orthopedics;   Laterality: Left;   COLONOSCOPY     ESOPHAGOGASTRODUODENOSCOPY     EXTRACORPOREAL SHOCK WAVE LITHOTRIPSY Left 07/24/2017   Procedure: EXTRACORPOREAL SHOCK WAVE LITHOTRIPSY (ESWL);  Surgeon: [name], MD;  Location: [facility];  Service: Urology;  Laterality: Left;   EXTRACORPOREAL SHOCK WAVE LITHOTRIPSY Right 12/11/2017   Procedure: EXTRACORPOREAL SHOCK WAVE LITHOTRIPSY (ESWL);  Surgeon: [name], MD;  Location: [facility];  Service: Urology;  Laterality: Right;   EYE SURGERY     INGUINAL HERNIA REPAIR Right 08/08/2017   Procedure: LAPAROSCOPIC INGUINAL HERNIA;  Surgeon: [name], MD;  Location: [facility];  Service: General;  Laterality: Right;   NERVE REPAIR Left 05/16/2018   Procedure: Cutaneous Nerve Repair;  Surgeon: [name], MD;  Location: [place];  Service: Orthopedics;  Laterality: Left;   SKIN FULL THICKNESS GRAFT Left 05/16/2018   Procedure: Skin Graft Full Thickness;  Surgeon: [name], MD;  Location: [place];  Service: Orthopedics;  Laterality: Left;   TENDON REPAIR Left 05/16/2018   Procedure: Extensor Tendon Repair;  Surgeon: [name], MD;  Location: [place];  Service: Orthopedics;  Laterality: Left;   TONSILLECTOMY     VASECTOMY         Home Medications    Prior to Admission medications   Medication Sig Start Date End Date Taking?  Authorizing Provider  benzonatate (TESSALON) 100 MG capsule Take 1 capsule (100 mg total) by mouth 3 (three) times daily as needed for cough. 08/20/20  Yes Sharion Balloon, NP  aspirin EC 81 MG tablet Take 81 mg by mouth at bedtime.     [provider]  atorvastatin (LIPITOR) 40 MG tablet TAKE 1 TABLET DAILY 10/20/19   [name], MD  azithromycin (ZITHROMAX) 250 MG tablet 2 tabs poqday1, 1 tab poqday 2-5 08/17/20   [name], MD  BD PEN NEEDLE MICRO U/F 32G X 6 MM MISC USE TO INJECT SAXENDA UNDER THE SKIN DAILY AS DIRECTED 06/19/20   [name], MD  escitalopram (LEXAPRO) 10 MG tablet TAKE 1 TABLET  DAILY 05/09/20   [name], MD  fluticasone (FLONASE) 50 MCG/ACT nasal spray USE 2 SPRAYS IN [facility] NOSTRIL DAILY 04/10/20   [name], MD  hydrochlorothiazide (HYDRODIURIL) 25 MG tablet TAKE 1 TABLET DAILY 04/20/20   [name], MD  Liraglutide -Weight Management (SAXENDA) 18 MG/3ML SOPN INJECT 1/2 ML SUBCUTANEOUSLY ONCE A DAY ('3MG'$  TOTAL) 07/17/20   [name], MD  losartan (COZAAR) 100 MG tablet TAKE 1 TABLET DAILY 08/14/20   [name], MD  Melatonin 10 MG TABS Take 10 mg by mouth at bedtime.     [provider]  meloxicam (MOBIC) 15 MG tablet TAKE 1 TABLET DAILY 03/28/20   [name], MD  metFORMIN (GLUCOPHAGE) 500 MG tablet TAKE 2 TABLETS TWICE A DAY WITH MEALS 04/28/20   [name], MD  Multiple Vitamins-Minerals (ONE-A-DAY MENS HEALTH FORMULA PO) Take 1 tablet by mouth daily.     [provider]  Omega-3 Krill Oil 500 MG CAPS Take 500 mg by mouth daily.     [provider]  omeprazole (PRILOSEC) 40 MG capsule TAKE 1 CAPSULE DAILY 07/22/19   Annie Main, FNP    Family History Family History  Problem Relation Age of Onset   Cancer Mother        mass on liver   Heart disease Father    Diabetes Father    Hyperlipidemia Father    Diabetes Sister    Heart disease Paternal Grandfather    Bladder Cancer Neg Hx    Kidney cancer Neg Hx    Prostate cancer Neg Hx     Social History Social History   Tobacco Use   Smoking status: Never   Smokeless tobacco: Never  Vaping Use   Vaping Use: Never used  Substance Use Topics   Alcohol use: Yes    Comment: RARE   Drug use: No     Allergies   Penicillins, Sulfa antibiotics, and Keflex [cephalexin]   Review of Systems Review of Systems  Constitutional:  Positive for fatigue. Negative for chills and fever.  HENT:  Positive for congestion. Negative for ear pain and sore throat.   Respiratory:  Positive for cough. Negative for shortness of breath.    Cardiovascular:  Negative for chest pain and palpitations.  Gastrointestinal:  Negative for abdominal pain, diarrhea and vomiting.  Skin:  Negative for color change and rash.  Neurological:  Positive for headaches. Negative for dizziness, syncope, weakness and numbness.  All other systems reviewed and are negative.   Physical Exam Triage Vital Signs ED Triage Vitals  Enc Vitals Group     BP      Pulse      Resp      Temp  Temp src      SpO2      Weight      Height      Head Circumference      Peak Flow      Pain Score      Pain Loc      Pain Edu?      Excl. in [place]?    No data found.  Updated Vital Signs BP 119/79   Pulse 86   Temp 98.8 F (37.1 C)   Resp 20   SpO2 96%   Visual Acuity Right Eye Distance:   Left Eye Distance:   Bilateral Distance:    Right Eye Near:   Left Eye Near:    Bilateral Near:     Physical Exam Vitals and nursing note reviewed.  Constitutional:      General: He is not in acute distress.    Appearance: He is well-developed. He is obese.  HENT:     Head: Normocephalic and atraumatic.     Right Ear: Tympanic membrane normal.     Left Ear: Tympanic membrane normal.     Nose: Rhinorrhea present.     Mouth/Throat:     Mouth: Mucous membranes are moist.     Pharynx: Oropharynx is clear.  Eyes:     Conjunctiva/sclera: Conjunctivae normal.  Cardiovascular:     Rate and Rhythm: Normal rate and regular rhythm.     Heart sounds: Normal heart sounds.  Pulmonary:     Effort: Pulmonary effort is normal. No respiratory distress.     Breath sounds: Normal breath sounds. No wheezing, rhonchi or rales.  Abdominal:     Palpations: Abdomen is soft.     Tenderness: There is no abdominal tenderness.  Musculoskeletal:     Cervical back: Neck supple.  Skin:    General: Skin is warm and dry.  Neurological:     General: No focal deficit present.     Mental Status: He is alert and oriented to person, place, and time.     Gait: Gait normal.   Psychiatric:        Mood and Affect: Mood normal.        Behavior: Behavior normal.     UC Treatments / Results  Labs (all labs ordered are listed, but only abnormal results are displayed) Labs Reviewed  NOVEL CORONAVIRUS, NAA    EKG   Radiology No results found.  Procedures Procedures (including critical care time)  Medications Ordered in UC Medications - No data to display  Initial Impression / Assessment and Plan / UC Course  I have reviewed the triage vital signs and the nursing notes.  Pertinent labs & imaging results that were available during my care of the patient were reviewed by me and considered in my medical decision making (see chart for details).  Viral illness, COVID test.  Patient is currently on Zithromax which was prescribed by his PCP.  No respiratory distress, O2 sat 96% on room air.  Treating cough with Tessalon Per[place].  Cautioned patient not to take over-the-counter products that may raise his blood pressure given his history of hypertension.  Also discussed Tylenol or ibuprofen as needed for fever or discomfort.  PCR COVID pending and patient instructed to quarantine per CDC guidelines.  ED precautions discussed.  Instructed patient to follow-up with his PCP if his symptoms are not improving.  He agrees to plan of care.   Final Clinical Impressions(s) / UC Diagnoses   Final diagnoses:  Encounter  for laboratory testing for COVID-19 virus  Viral illness     Discharge Instructions      Your COVID test is pending.  You should self quarantine until the test result is back.    Take the [facility] as needed for cough.    Be careful taking over-the-counter products that raise your blood pressure.  Take Tylenol or ibuprofen as needed for fever or discomfort.  Rest and keep yourself hydrated.    Follow-up with your primary care provider if your symptoms are not improving.         ED Prescriptions     Medication Sig Dispense Auth.  Provider   benzonatate (TESSALON) 100 MG capsule Take 1 capsule (100 mg total) by mouth 3 (three) times daily as needed for cough. 21 capsule Sharion Balloon, NP      PDMP not reviewed this encounter.   Sharion Balloon, NP 08/20/20 [phone]

## 2020-08-21 ENCOUNTER — Encounter: Payer: Self-pay | Admitting: Family Medicine

## 2020-08-21 LAB — NOVEL CORONAVIRUS, NAA: SARS-CoV-2, NAA: DETECTED — AB

## 2020-08-21 LAB — SARS-COV-2, NAA 2 DAY TAT

## 2020-08-21 MED ORDER — NIRMATRELVIR/RITONAVIR (PAXLOVID)TABLET: 3.0000 | ORAL_TABLET | Freq: Two times a day (BID) | ORAL | 0 refills | Status: AC

## 2020-08-26 ENCOUNTER — Encounter: Payer: Self-pay | Admitting: Family Medicine

## 2020-09-01 ENCOUNTER — Other Ambulatory Visit: Payer: Self-pay

## 2020-09-01 ENCOUNTER — Ambulatory Visit (HOSPITAL_COMMUNITY)
Admission: RE | Admit: 2020-09-01 | Discharge: 2020-09-01 | Disposition: A | Discharge status: Home / Self Care | Source: Ambulatory Visit | Attending: Family Medicine | Admitting: Family Medicine

## 2020-09-01 DIAGNOSIS — M75102 Unspecified rotator cuff tear or rupture of left shoulder, not specified as traumatic: Secondary | ICD-10-CM | POA: Diagnosis not present

## 2020-09-15 ENCOUNTER — Telehealth: Payer: Self-pay | Admitting: Family Medicine

## 2020-09-15 NOTE — Telephone Encounter (Signed)
Received call from Tera at [facility] ([place]); patient's referral says evaluate for colonoscopy (upcoming appt on 9/13). Please resend; needs to say evaluate and treat. Please advise at [phone]. Fax number is [phone].

## 2020-09-15 NOTE — Telephone Encounter (Signed)
Please re-send requested documents

## 2020-10-05 ENCOUNTER — Encounter: Payer: Self-pay | Admitting: Family Medicine

## 2020-10-05 ENCOUNTER — Other Ambulatory Visit: Payer: Self-pay

## 2020-10-05 ENCOUNTER — Ambulatory Visit (INDEPENDENT_AMBULATORY_CARE_PROVIDER_SITE_OTHER): Admitting: Family Medicine

## 2020-10-05 VITALS — BP 144/88 | HR 88 | Temp 98.3°F | Resp 16 | Wt 285.0 lb

## 2020-10-05 DIAGNOSIS — D509 Iron deficiency anemia, unspecified: Secondary | ICD-10-CM | POA: Diagnosis not present

## 2020-10-05 NOTE — Progress Notes (Signed)
Subjective:    Patient ID: Zachary Gillespie, male    DOB: 1965/04/21, 55 y.o.   MRN: [id number] Patient has mild anemia.  During the work-up he was found to have iron deficiency anemia despite the fact he is currently on ferrous sulfate 325 mg daily.  He pathology due to guaiac positive stool.  Endoscopy revealed no abnormalities.  A colonoscopy revealed one 4 mm tubular adenoma but was otherwise normal.  There was no evidence of celiac disease.  Therefore it appears that the patient has iron deficiency anemia secondary to poor iron absorption.  Overall he feels fine with no abnormalities Past Medical History:  Diagnosis Date   DDD (degenerative disc disease), lumbar    Dyslipidemia    GERD (gastroesophageal reflux disease)    History of hiatal hernia    History of kidney stones    H/O   HLD (hyperlipidemia)    Hypertension    Obesity    Prediabetes    Sciatica of left side    Sleep apnea    AHI 34   Tubular adenoma of colon    2019 (Dr. Benson Norway)   Past Surgical History:  Procedure Laterality Date   AMPUTATION Left 05/16/2018   Procedure: Ray Resection Left Index Finger;  Surgeon: [name], MD;  Location: [place];  Service: Orthopedics;  Laterality: Left;   COLONOSCOPY     ESOPHAGOGASTRODUODENOSCOPY     EXTRACORPOREAL SHOCK WAVE LITHOTRIPSY Left 07/24/2017   Procedure: EXTRACORPOREAL SHOCK WAVE LITHOTRIPSY (ESWL);  Surgeon: [name], MD;  Location: [facility];  Service: Urology;  Laterality: Left;   EXTRACORPOREAL SHOCK WAVE LITHOTRIPSY Right 12/11/2017   Procedure: EXTRACORPOREAL SHOCK WAVE LITHOTRIPSY (ESWL);  Surgeon: [name], MD;  Location: [facility];  Service: Urology;  Laterality: Right;   EYE SURGERY     INGUINAL HERNIA REPAIR Right 08/08/2017   Procedure: LAPAROSCOPIC INGUINAL HERNIA;  Surgeon: [name], MD;  Location: [facility];  Service: General;  Laterality: Right;   NERVE REPAIR Left 05/16/2018   Procedure: Cutaneous Nerve Repair;  Surgeon: [name],  MD;  Location: [place];  Service: Orthopedics;  Laterality: Left;   SKIN FULL THICKNESS GRAFT Left 05/16/2018   Procedure: Skin Graft Full Thickness;  Surgeon: [name], MD;  Location: [place];  Service: Orthopedics;  Laterality: Left;   TENDON REPAIR Left 05/16/2018   Procedure: Extensor Tendon Repair;  Surgeon: [name], MD;  Location: [place];  Service: Orthopedics;  Laterality: Left;   TONSILLECTOMY     VASECTOMY     Current Outpatient Medications on File Prior to Visit  Medication Sig Dispense Refill   aspirin EC 81 MG tablet Take 81 mg by mouth at bedtime.      atorvastatin (LIPITOR) 40 MG tablet TAKE 1 TABLET DAILY 90 tablet 3   azithromycin (ZITHROMAX) 250 MG tablet 2 tabs poqday1, 1 tab poqday 2-5 6 tablet 0   BD PEN NEEDLE MICRO U/F 32G X 6 MM MISC USE TO INJECT SAXENDA UNDER THE SKIN DAILY AS DIRECTED 100 each 3   benzonatate (TESSALON) 100 MG capsule Take 1 capsule (100 mg total) by mouth 3 (three) times daily as needed for cough. 21 capsule 0   escitalopram (LEXAPRO) 10 MG tablet TAKE 1 TABLET DAILY 90 tablet 3   fluticasone (FLONASE) 50 MCG/ACT nasal spray USE 2 SPRAYS IN EACH NOSTRIL DAILY 48 g 3   hydrochlorothiazide (HYDRODIURIL) 25 MG tablet TAKE 1 TABLET DAILY 90 tablet 3   Liraglutide -Weight Management (SAXENDA) 18  MG/3ML SOPN INJECT 1/2 ML SUBCUTANEOUSLY ONCE A DAY (3MG  TOTAL) 15 mL 3   losartan (COZAAR) 100 MG tablet TAKE 1 TABLET DAILY 90 tablet 3   Melatonin 10 MG TABS Take 10 mg by mouth at bedtime.      meloxicam (MOBIC) 15 MG tablet TAKE 1 TABLET DAILY 30 tablet 11   metFORMIN (GLUCOPHAGE) 500 MG tablet TAKE 2 TABLETS TWICE A DAY WITH MEALS 360 tablet 3   Multiple Vitamins-Minerals (ONE-A-DAY MENS HEALTH FORMULA PO) Take 1 tablet by mouth daily.      Omega-3 Krill Oil 500 MG CAPS Take 500 mg by mouth daily.      omeprazole (PRILOSEC) 40 MG capsule TAKE 1 CAPSULE DAILY 90 capsule 3   No current facility-administered medications on file prior to visit.   Allergies   Allergen Reactions   Penicillins Anaphylaxis and Other (See Comments)    Has patient had a PCN reaction causing immediate rash, facial/tongue/throat swelling, SOB or lightheadedness with hypotension: Yes Has patient had a PCN reaction causing severe rash involving mucus membranes or skin necrosis: No Has patient had a PCN reaction that required hospitalization: Yes Has patient had a PCN reaction occurring within the last 10 years: No If all of the above answers are "NO", then may proceed with Cephalosporin use.    Sulfa Antibiotics Swelling and Other (See Comments)    Childhood allergy- site of swelling not recalled   Keflex [Cephalexin] Other (See Comments)    Childhood allergy- reaction not recalled   Social History   Socioeconomic History   Marital status: Married    Spouse name: Not on file   Number of children: 1   Years of education: college   Highest education level: Not on file  Occupational History   Not on file  Tobacco Use   Smoking status: Never   Smokeless tobacco: Never  Vaping Use   Vaping Use: Never used  Substance and Sexual Activity   Alcohol use: Yes    Comment: RARE   Drug use: No   Sexual activity: Yes  Other Topics Concern   Not on file  Social History Narrative   Drinks about 1 cup of tea a day    Social Determinants of Health   Financial Resource Strain: Not on file  Food Insecurity: Not on file  Transportation Needs: Not on file  Physical Activity: Not on file  Stress: Not on file  Social Connections: Not on file  Intimate Partner Violence: Not on file      Review of Systems  Musculoskeletal:  Positive for back pain.  All other systems reviewed and are negative.     Objective:   Physical Exam Vitals reviewed.  Constitutional:      Appearance: Normal appearance. He is obese.  Cardiovascular:     Rate and Rhythm: Normal rate and regular rhythm.     Heart sounds: Normal heart sounds.  Pulmonary:     Effort: Pulmonary effort is  normal. No respiratory distress.     Breath sounds: Normal breath sounds. No wheezing, rhonchi or rales.  Abdominal:     General: Abdomen is flat. Bowel sounds are normal. There is no distension.     Palpations: Abdomen is soft.     Tenderness: There is no abdominal tenderness. There is no guarding.  Neurological:     Mental Status: He is alert.          Assessment & Plan:  Iron deficiency anemia, unspecified iron deficiency anemia type -  Plan: Iron, TIBC and Ferritin Panel, CBC with Differential/Platelet We discussed a referral to oncology for IV iron due to poor absorption.  However his hemoglobin is only mildly low between 11 and 12 so I do not feel that is clinically significant.  He has had an adequate GI evaluation to rule out malignancy as a source of his blood loss.  Therefore his other option will be to continue iron orally and "just live with it".  The last option would be a capsule endoscopy to rule out the rare loss of blood through the small intestine.  The patient elects just to live with it for now.

## 2020-10-06 LAB — CBC WITH DIFFERENTIAL/PLATELET
Absolute Monocytes: 639 cells/uL (ref 200–950)
Basophils Absolute: 90 cells/uL (ref 0–200)
Basophils Relative: 1 %
Eosinophils Absolute: 315 cells/uL (ref 15–500)
Eosinophils Relative: 3.5 %
HCT: 41.3 % (ref 38.5–50.0)
Hemoglobin: 13.3 g/dL (ref 13.2–17.1)
Lymphs Abs: 1719 cells/uL (ref 850–3900)
MCH: 26.2 pg — ABNORMAL LOW (ref 27.0–33.0)
MCHC: 32.2 g/dL (ref 32.0–36.0)
MCV: 81.5 fL (ref 80.0–100.0)
MPV: 11.1 fL (ref 7.5–12.5)
Monocytes Relative: 7.1 %
Neutro Abs: 6237 cells/uL (ref 1500–7800)
Neutrophils Relative %: 69.3 %
Platelets: 382 10*3/uL (ref 140–400)
RBC: 5.07 10*6/uL (ref 4.20–5.80)
RDW: 17 % — ABNORMAL HIGH (ref 11.0–15.0)
Total Lymphocyte: 19.1 %
WBC: 9 10*3/uL (ref 3.8–10.8)

## 2020-10-06 LAB — IRON,TIBC AND FERRITIN PANEL
%SAT: 6 % (calc) — ABNORMAL LOW (ref 20–48)
Ferritin: 16 ng/mL — ABNORMAL LOW (ref 38–380)
Iron: 25 ug/dL — ABNORMAL LOW (ref 50–180)
TIBC: 428 mcg/dL (calc) — ABNORMAL HIGH (ref 250–425)

## 2020-10-11 ENCOUNTER — Other Ambulatory Visit: Payer: Self-pay | Admitting: Family Medicine

## 2020-10-12 ENCOUNTER — Encounter: Payer: Self-pay | Admitting: Family Medicine

## 2020-11-17 ENCOUNTER — Other Ambulatory Visit: Payer: Self-pay | Admitting: Family Medicine

## 2020-11-21 ENCOUNTER — Encounter: Payer: Self-pay | Admitting: Family Medicine

## 2020-12-14 ENCOUNTER — Other Ambulatory Visit: Payer: Self-pay

## 2020-12-14 ENCOUNTER — Encounter: Payer: Self-pay | Admitting: Family Medicine

## 2020-12-14 MED ORDER — OMEPRAZOLE 40 MG PO CPDR: 40.0000 mg | DELAYED_RELEASE_CAPSULE | Freq: Every day | ORAL | 3 refills | Status: AC

## 2020-12-19 ENCOUNTER — Other Ambulatory Visit: Payer: Self-pay

## 2020-12-19 ENCOUNTER — Encounter: Payer: Self-pay | Admitting: Family Medicine

## 2020-12-19 MED ORDER — SAXENDA 18 MG/3ML ~~LOC~~ SOPN: PEN_INJECTOR | SUBCUTANEOUS | 1 refills | Status: DC

## 2021-01-23 ENCOUNTER — Encounter: Payer: Self-pay | Admitting: Family Medicine

## 2021-01-23 ENCOUNTER — Other Ambulatory Visit: Payer: Self-pay

## 2021-01-23 ENCOUNTER — Ambulatory Visit (INDEPENDENT_AMBULATORY_CARE_PROVIDER_SITE_OTHER): Admitting: Family Medicine

## 2021-01-23 VITALS — BP 128/68 | HR 91 | Temp 98.0°F | Resp 18 | Ht 69.0 in | Wt 285.0 lb

## 2021-01-23 DIAGNOSIS — S39012A Strain of muscle, fascia and tendon of lower back, initial encounter: Secondary | ICD-10-CM | POA: Diagnosis not present

## 2021-01-23 MED ORDER — CYCLOBENZAPRINE HCL 10 MG PO TABS: 10.0000 mg | ORAL_TABLET | Freq: Three times a day (TID) | ORAL | 0 refills | Status: DC | PRN

## 2021-01-23 NOTE — Progress Notes (Signed)
Subjective:    Patient ID: Zachary Gillespie, male    DOB: 27-May-1965, 56 y.o.   MRN: [id number]  Back Pain  Patient reports 1 week history of pain in his right lower back.  The pain is actually located around the level of T12.  Is located to the right of the spinous processes over in the lateral side of his mid back.  There are some tenderness to palpation in that area.  He reports pain with movement and bending and standing.  He has chronic low back pain around the level of L5 with some mild right-sided sciatica.  His most recent MRI was in 2021 which showed possible nerve impingement at L4-L5 and L5-S1 however today's pain is much higher than that.  He denies any hematuria or dysuria.  He has no CVA tenderness.  He denies any cough or pleurisy or shortness of breath.  The only exacerbating factor is movement Past Medical History:  Diagnosis Date   DDD (degenerative disc disease), lumbar    Dyslipidemia    GERD (gastroesophageal reflux disease)    History of hiatal hernia    History of kidney stones    H/O   HLD (hyperlipidemia)    Hypertension    Obesity    Prediabetes    Sciatica of left side    Sleep apnea    AHI 34   Tubular adenoma of colon    2019 (Dr. Benson Norway)   Past Surgical History:  Procedure Laterality Date   AMPUTATION Left 05/16/2018   Procedure: Ray Resection Left Index Finger;  Surgeon: [name], MD;  Location: [place];  Service: Orthopedics;  Laterality: Left;   COLONOSCOPY     ESOPHAGOGASTRODUODENOSCOPY     EXTRACORPOREAL SHOCK WAVE LITHOTRIPSY Left 07/24/2017   Procedure: EXTRACORPOREAL SHOCK WAVE LITHOTRIPSY (ESWL);  Surgeon: [name], MD;  Location: [facility];  Service: Urology;  Laterality: Left;   EXTRACORPOREAL SHOCK WAVE LITHOTRIPSY Right 12/11/2017   Procedure: EXTRACORPOREAL SHOCK WAVE LITHOTRIPSY (ESWL);  Surgeon: [name], MD;  Location: [facility];  Service: Urology;  Laterality: Right;   EYE SURGERY     INGUINAL HERNIA REPAIR Right 08/08/2017    Procedure: LAPAROSCOPIC INGUINAL HERNIA;  Surgeon: [name], MD;  Location: [facility];  Service: General;  Laterality: Right;   NERVE REPAIR Left 05/16/2018   Procedure: Cutaneous Nerve Repair;  Surgeon: [name], MD;  Location: [place];  Service: Orthopedics;  Laterality: Left;   SKIN FULL THICKNESS GRAFT Left 05/16/2018   Procedure: Skin Graft Full Thickness;  Surgeon: [name], MD;  Location: [place];  Service: Orthopedics;  Laterality: Left;   TENDON REPAIR Left 05/16/2018   Procedure: Extensor Tendon Repair;  Surgeon: [name], MD;  Location: [place];  Service: Orthopedics;  Laterality: Left;   TONSILLECTOMY     VASECTOMY     Current Outpatient Medications on File Prior to Visit  Medication Sig Dispense Refill   aspirin EC 81 MG tablet Take 81 mg by mouth at bedtime.      atorvastatin (LIPITOR) 40 MG tablet TAKE 1 TABLET DAILY 90 tablet 3   BD PEN NEEDLE MICRO U/F 32G X 6 MM MISC USE TO INJECT SAXENDA UNDER THE SKIN DAILY AS DIRECTED 100 each 3   escitalopram (LEXAPRO) 10 MG tablet TAKE 1 TABLET DAILY 90 tablet 3   ferrous sulfate 325 (65 FE) MG tablet Take 325 mg by mouth daily with breakfast.     fluticasone (FLONASE) 50 MCG/ACT nasal spray USE 2  SPRAYS IN EACH NOSTRIL DAILY 48 g 3   hydrochlorothiazide (HYDRODIURIL) 25 MG tablet TAKE 1 TABLET DAILY 90 tablet 3   Liraglutide -Weight Management (SAXENDA) 18 MG/3ML SOPN INJECT 1/2 ML SUBCUTANEOUSLY ONCE A DAY (3MG  TOTAL) 15 mL 1   losartan (COZAAR) 100 MG tablet TAKE 1 TABLET DAILY 90 tablet 3   Melatonin 10 MG TABS Take 10 mg by mouth at bedtime.      meloxicam (MOBIC) 15 MG tablet TAKE 1 TABLET DAILY 30 tablet 11   metFORMIN (GLUCOPHAGE) 500 MG tablet TAKE 2 TABLETS TWICE A DAY WITH MEALS 360 tablet 3   Multiple Vitamins-Minerals (ONE-A-DAY MENS HEALTH FORMULA PO) Take 1 tablet by mouth daily.      Omega-3 Krill Oil 500 MG CAPS Take 500 mg by mouth daily.      omeprazole (PRILOSEC) 40 MG capsule Take 1 capsule (40 mg  total) by mouth daily. 90 capsule 3   No current facility-administered medications on file prior to visit.   Allergies  Allergen Reactions   Penicillins Anaphylaxis and Other (See Comments)    Has patient had a PCN reaction causing immediate rash, facial/tongue/throat swelling, SOB or lightheadedness with hypotension: Yes Has patient had a PCN reaction causing severe rash involving mucus membranes or skin necrosis: No Has patient had a PCN reaction that required hospitalization: Yes Has patient had a PCN reaction occurring within the last 10 years: No If all of the above answers are "NO", then may proceed with Cephalosporin use.    Sulfa Antibiotics Swelling and Other (See Comments)    Childhood allergy- site of swelling not recalled   Keflex [Cephalexin] Other (See Comments)    Childhood allergy- reaction not recalled   Social History   Socioeconomic History   Marital status: Married    Spouse name: Not on file   Number of children: 1   Years of education: college   Highest education level: Not on file  Occupational History   Not on file  Tobacco Use   Smoking status: Never   Smokeless tobacco: Never  Vaping Use   Vaping Use: Never used  Substance and Sexual Activity   Alcohol use: Yes    Comment: RARE   Drug use: No   Sexual activity: Yes  Other Topics Concern   Not on file  Social History Narrative   Drinks about 1 cup of tea a day    Social Determinants of Health   Financial Resource Strain: Not on file  Food Insecurity: Not on file  Transportation Needs: Not on file  Physical Activity: Not on file  Stress: Not on file  Social Connections: Not on file  Intimate Partner Violence: Not on file      Review of Systems  Musculoskeletal:  Positive for back pain.  All other systems reviewed and are negative.     Objective:   Physical Exam Vitals reviewed.  Constitutional:      Appearance: Normal appearance. He is obese.  Cardiovascular:     Rate and  Rhythm: Normal rate and regular rhythm.     Heart sounds: Normal heart sounds.  Pulmonary:     Effort: Pulmonary effort is normal. No respiratory distress.     Breath sounds: Normal breath sounds. No wheezing, rhonchi or rales.  Abdominal:     General: Abdomen is flat. Bowel sounds are normal. There is no distension.     Palpations: Abdomen is soft.     Tenderness: There is no abdominal tenderness. There is  no guarding.  Musculoskeletal:     Thoracic back: Spasms present. Decreased range of motion.     Lumbar back: Spasms and tenderness present. No bony tenderness. Decreased range of motion. Negative right straight leg raise test and negative left straight leg raise test.       Back:  Neurological:     Mental Status: He is alert.      IMPRESSION: 1. Disc bulge with small central/right paracentral disc protrusion at L4-5 with resultant mild to moderate canal and bilateral lateral recess stenosis, slightly worse on the right. 2. Small central to right subarticular disc protrusion with inferior migration at L5-S1, closely approximating and potentially irritating the descending right S1 nerve root. 3. Mild left L5 foraminal stenosis related to disc bulge and reactive endplate changes.     Assessment & Plan:  Strain of lumbar region, initial encounter Fortunately I think this is muscular.  Recommended Flexeril 10 mg every 8 hours as needed for muscle spasms.  Continue meloxicam.  Recommended heat and range of motion activity.  Reassess in 2 weeks if no better or sooner if worsening

## 2021-02-18 ENCOUNTER — Other Ambulatory Visit: Payer: Self-pay | Admitting: Family Medicine

## 2021-03-10 ENCOUNTER — Encounter: Payer: Self-pay | Admitting: Family Medicine

## 2021-03-13 ENCOUNTER — Other Ambulatory Visit: Payer: Self-pay

## 2021-03-13 MED ORDER — CETIRIZINE HCL 10 MG PO TABS: 10.0000 mg | ORAL_TABLET | Freq: Every day | ORAL | 3 refills | Status: AC

## 2021-03-20 ENCOUNTER — Emergency Department

## 2021-03-20 ENCOUNTER — Emergency Department
Admission: EM | Admit: 2021-03-20 | Discharge: 2021-03-20 | Disposition: A | Discharge status: Home / Self Care | Attending: Emergency Medicine | Admitting: Emergency Medicine

## 2021-03-20 ENCOUNTER — Encounter: Payer: Self-pay | Admitting: Emergency Medicine

## 2021-03-20 ENCOUNTER — Other Ambulatory Visit: Payer: Self-pay

## 2021-03-20 DIAGNOSIS — R109 Unspecified abdominal pain: Secondary | ICD-10-CM | POA: Diagnosis present

## 2021-03-20 DIAGNOSIS — I10 Essential (primary) hypertension: Secondary | ICD-10-CM | POA: Diagnosis not present

## 2021-03-20 DIAGNOSIS — N202 Calculus of kidney with calculus of ureter: Secondary | ICD-10-CM | POA: Insufficient documentation

## 2021-03-20 DIAGNOSIS — N201 Calculus of ureter: Secondary | ICD-10-CM

## 2021-03-20 LAB — URINALYSIS, ROUTINE W REFLEX MICROSCOPIC
Bacteria, UA: NONE SEEN
Bilirubin Urine: NEGATIVE
Glucose, UA: NEGATIVE mg/dL
Ketones, ur: NEGATIVE mg/dL
Leukocytes,Ua: NEGATIVE
Nitrite: NEGATIVE
Protein, ur: NEGATIVE mg/dL
RBC / HPF: >50 RBC/hpf — ABNORMAL HIGH (ref 0–5)
Specific Gravity, Urine: 1.027 (ref 1.005–1.030)
Squamous Epithelial / HPF: NONE SEEN (ref 0–5)
pH: 5 (ref 5.0–8.0)

## 2021-03-20 LAB — BASIC METABOLIC PANEL
Anion gap: 8 (ref 5–15)
BUN: 22 mg/dL — ABNORMAL HIGH (ref 6–20)
CO2: 30 mmol/L (ref 22–32)
Calcium: 9.5 mg/dL (ref 8.9–10.3)
Chloride: 102 mmol/L (ref 98–111)
Creatinine, Ser: 1.03 mg/dL (ref 0.61–1.24)
GFR, Estimated: >60 mL/min (ref >60)
Glucose, Bld: 169 mg/dL — ABNORMAL HIGH (ref 70–99)
Potassium: 3.7 mmol/L (ref 3.5–5.1)
Sodium: 140 mmol/L (ref 135–145)

## 2021-03-20 LAB — CBC
HCT: 42.7 % (ref 39.0–52.0)
Hemoglobin: 13.6 g/dL (ref 13.0–17.0)
MCH: 26.3 pg (ref 26.0–34.0)
MCHC: 31.9 g/dL (ref 30.0–36.0)
MCV: 82.6 fL (ref 80.0–100.0)
Platelets: 421 10*3/uL — ABNORMAL HIGH (ref 150–400)
RBC: 5.17 MIL/uL (ref 4.22–5.81)
RDW: 16.1 % — ABNORMAL HIGH (ref 11.5–15.5)
WBC: 8.8 10*3/uL (ref 4.0–10.5)
nRBC: 0 % (ref 0.0–0.2)

## 2021-03-20 MED ORDER — MORPHINE SULFATE (PF) 4 MG/ML IV SOLN
4.0000 mg | Freq: Once | INTRAVENOUS | Status: AC
  Administered 2021-03-20: 4 mg via INTRAVENOUS
  Filled 2021-03-20: qty 1

## 2021-03-20 MED ORDER — SODIUM CHLORIDE 0.9 % IV BOLUS
1000.0000 mL | Freq: Once | INTRAVENOUS | Status: AC
  Administered 2021-03-20: 1000 mL via INTRAVENOUS

## 2021-03-20 MED ORDER — ONDANSETRON HCL 4 MG/2ML IJ SOLN
4.0000 mg | Freq: Once | INTRAMUSCULAR | Status: AC
  Administered 2021-03-20: 4 mg via INTRAVENOUS
  Filled 2021-03-20: qty 2

## 2021-03-20 MED ORDER — OXYCODONE-ACETAMINOPHEN 10-325 MG PO TABS: 1.0000 | ORAL_TABLET | Freq: Four times a day (QID) | ORAL | 0 refills | Status: DC | PRN

## 2021-03-20 MED ORDER — ONDANSETRON 4 MG PO TBDP: 4.0000 mg | ORAL_TABLET | Freq: Three times a day (TID) | ORAL | 0 refills | Status: DC | PRN

## 2021-03-20 NOTE — ED Triage Notes (Signed)
Pt to ED via POV with c/o left flank pain with nausea. Pt has hx of kidney stones and feels like he may have another one. Has not seen blood in urine. This began at 0630 this am ?

## 2021-03-20 NOTE — ED Provider Notes (Signed)
? ?[facility] ?Provider Note ? ? ? Event Date/Time  ? First MD Initiated Contact with Patient 03/20/21 0818   ?  (approximate) ? ? ?History  ? ?Flank Pain ? ? ?HPI ? ?Zachary Gillespie is a 56 y.o. male presents to the ED with complaint of flank pain that woke him up at 6 AM this morning.  Patient has a history of kidney stones but has not had one in several years.  He states that twice he has had to have lithotripsy because stones measured 8 and 9 mm.  Patient said he has been able to urinate this morning currently he is quite uncomfortable.  Patient has a history of kidney stones, hypertension, prediabetes, degenerative disc disease lumbar area, sleep apnea and prediabetes.  Currently rates pain as 7 out of 10. ?  ? ? ?Physical Exam  ? ?Triage Vital Signs: ?ED Triage Vitals  ?Enc Vitals Group  ?   BP 03/20/21 0803 (!) 151/87  ?   Pulse Rate 03/20/21 0803 89  ?   Resp 03/20/21 0803 20  ?   Temp 03/20/21 0803 98 ?F (36.7 ?C)  ?   Temp Source 03/20/21 0803 Oral  ?   SpO2 03/20/21 0803 100 %  ?   Weight 03/20/21 0804 280 lb (127 kg)  ?   Height 03/20/21 0804 '5\' 9"'$  (1.753 m)  ?   Head Circumference --   ?   Peak Flow --   ?   Pain Score 03/20/21 0804 7  ?   Pain Loc --   ?   Pain Edu? --   ?   Excl. in [place]? --   ? ? ?Most recent vital signs: ?Vitals:  ? 03/20/21 0803 03/20/21 1112  ?BP: (!) 151/87 131/84  ?Pulse: 89 79  ?Resp: 20 20  ?Temp: 98 ?F (36.7 ?C)   ?SpO2: 100% 97%  ? ? ? ?General: Awake, no distress.  ?CV:  Good peripheral perfusion.  Heart regular rate and rhythm. ?Resp:  Normal effort.  Lungs are clear bilaterally. ?Abd:  No distention.  Bowel sounds normoactive x4 quadrants. ?Other:  Positive left flank pain.  Patient is ambulatory without any assistance. ? ? ?ED Results / Procedures / Treatments  ? ?Labs ?(all labs ordered are listed, but only abnormal results are displayed) ?Labs Reviewed  ?URINALYSIS, ROUTINE W REFLEX MICROSCOPIC - Abnormal; Notable for the following components:  ?     Result Value  ? Color, Urine YELLOW (*)   ? APPearance CLEAR (*)   ? Hgb urine dipstick LARGE (*)   ? RBC / HPF >50 (*)   ? All other components within normal limits  ?BASIC METABOLIC PANEL - Abnormal; Notable for the following components:  ? Glucose, Bld 169 (*)   ? BUN 22 (*)   ? All other components within normal limits  ?CBC - Abnormal; Notable for the following components:  ? RDW 16.1 (*)   ? Platelets 421 (*)   ? All other components within normal limits  ? ? ? ? ?RADIOLOGY ?CT renal stone study images were reviewed by myself and radiology report reviewed.. 5 mm obstructing left ureteral calculus at the iliac crest level. ? ? ? ?PROCEDURES: ? ?Critical Care performed:  ? ?Procedures ? ? ?MEDICATIONS ORDERED IN ED: ?Medications  ?sodium chloride 0.9 % bolus 1,000 mL (0 mLs Intravenous Stopped 03/20/21 0955)  ?morphine (PF) 4 MG/ML injection 4 mg (4 mg Intravenous Given 03/20/21 0834)  ?ondansetron (ZOFRAN) injection 4  mg (4 mg Intravenous Given 03/20/21 0833)  ? ? ? ?IMPRESSION / MDM / ASSESSMENT AND PLAN / ED COURSE  ?I reviewed the triage vital signs and the nursing notes. ? ? ?Differential diagnosis includes, but is not limited to, left flank pain, muscle skeletal pain, kidney stone left side, urinary tract infection. ? ?56 year old male presents to the ED with complaint of left flank pain with known history of kidney stones.  Patient states that in the past he has also had to have lithotripsy as he he has had stones as large as 9 mm.  Records indicate that patient has not had a kidney stone since December 2019.  Patient was given morphine 4 mg and Zofran with pain control.  CT scan reviewed and shows a obstructing 5 mm stone left urethral at the iliac crest level.  Labs were reviewed and white count was 8.8, BMP was normal with the exception of glucose 169 and BUN 22.  This was not a fasting glucose as patient has had juice to drink this morning.  Urinalysis showed greater than 50,000 RBCs and no bacteria.   I consulted Dr. Bernardo Heater who is on-call for urology who advised that if pain is controlled in the emergency department patient can be seen outpatient in the office.  Patient states there has been no continued pain or nausea since getting the pain medication and he is comfortable being sent home with pain medication and Zofran.  Flomax was not prescribed for the patient as he has an allergy to sulfa drugs and was not recommended due to his high sensitivity.  Prescription for Percocet and Zofran was sent to his pharmacy.  He is aware that he needs to call and make an appointment for follow-up in the office.  He will return to the emergency department if any severe worsening of his symptoms such as fever, chills, nausea or vomiting or worsening of his pain. ? ? ?FINAL CLINICAL IMPRESSION(S) / ED DIAGNOSES  ? ?Final diagnoses:  ?Ureterolithiasis  ? ? ? ?Rx / DC Orders  ? ?ED Discharge Orders   ? ?      Ordered  ?  oxyCODONE-acetaminophen (PERCOCET) 10-325 MG tablet  Every 6 hours PRN       ? 03/20/21 1116  ?  ondansetron (ZOFRAN-ODT) 4 MG disintegrating tablet  Every 8 hours PRN       ? 03/20/21 1116  ? ?  ?  ? ?  ? ? ? ?Note:  This document was prepared using Dragon voice recognition software and may include unintentional dictation errors. ?  ?Johnn Hai, PA-C ?03/20/21 1138 ? ?  ?Lavonia Drafts, MD ?03/20/21 1219 ? ?

## 2021-03-20 NOTE — Discharge Instructions (Signed)
Call make a follow-up appointment with Dr. Bernardo Heater who will see you in the office.  Pain medication was sent to your pharmacy along with medication for nausea.  Increase fluids.  Return to the emergency department if any severe worsening of your symptoms. ?

## 2021-03-20 NOTE — ED Notes (Signed)
See triage note  presents with sudden onset of left flank pain  states pain woke him up about 6 am  hx of renal stones in past ? ?

## 2021-03-22 ENCOUNTER — Encounter: Payer: Self-pay | Admitting: Urology

## 2021-03-22 ENCOUNTER — Ambulatory Visit
Admission: RE | Admit: 2021-03-22 | Discharge: 2021-03-22 | Disposition: A | Discharge status: Home / Self Care | Attending: Urology | Admitting: Urology

## 2021-03-22 ENCOUNTER — Other Ambulatory Visit: Payer: Self-pay

## 2021-03-22 ENCOUNTER — Ambulatory Visit (INDEPENDENT_AMBULATORY_CARE_PROVIDER_SITE_OTHER): Admitting: Urology

## 2021-03-22 ENCOUNTER — Ambulatory Visit
Admission: RE | Admit: 2021-03-22 | Discharge: 2021-03-22 | Disposition: A | Discharge status: Home / Self Care | Source: Ambulatory Visit | Attending: Urology | Admitting: Urology

## 2021-03-22 VITALS — BP 153/85 | HR 105 | Ht 69.0 in | Wt 280.0 lb

## 2021-03-22 DIAGNOSIS — N132 Hydronephrosis with renal and ureteral calculous obstruction: Secondary | ICD-10-CM | POA: Diagnosis not present

## 2021-03-22 DIAGNOSIS — N201 Calculus of ureter: Secondary | ICD-10-CM

## 2021-03-22 DIAGNOSIS — N23 Unspecified renal colic: Secondary | ICD-10-CM | POA: Diagnosis not present

## 2021-03-22 DIAGNOSIS — N200 Calculus of kidney: Secondary | ICD-10-CM | POA: Insufficient documentation

## 2021-03-22 MED ORDER — TAMSULOSIN HCL 0.4 MG PO CAPS: 0.4000 mg | ORAL_CAPSULE | Freq: Every day | ORAL | 0 refills | Status: DC

## 2021-03-22 NOTE — Progress Notes (Signed)
03/22/2021 2:19 PM   Zachary Gillespie 06-11-1965 [id number]  Referring provider: Donita Brooks, MD [address] [place],  [place] [address]  Chief Complaint  Patient presents with   Nephrolithiasis    HPI: Zachary Gillespie is a 56 y.o. male who presents for a reestablish visit for evaluation of renal colic.  [facility] ED visit 03/20/2021 with acute onset of left flank pain rated 7/10 UA with >50 RBC CT with a 5 mm left mid ureteral calculus with moderate hydronephrosis/hydroureter Pain was controlled with parenteral analgesics and he was discharged on oxycodone and Zofran Was not prescribed tamsulosin due to history of sulfa allergy however he has taken this before without problems Has not had recurrent pain since ED discharge No bothersome LUTS Not aware of passing a stone SWL 12/2017   PMH: Past Medical History:  Diagnosis Date   DDD (degenerative disc disease), lumbar    Dyslipidemia    GERD (gastroesophageal reflux disease)    History of hiatal hernia    History of kidney stones    H/O   HLD (hyperlipidemia)    Hypertension    Obesity    Prediabetes    Sciatica of left side    Sleep apnea    AHI 34   Tubular adenoma of colon    2019 (Dr. Elnoria Howard)    Surgical History: Past Surgical History:  Procedure Laterality Date   AMPUTATION Left 05/16/2018   Procedure: Ray Resection Left Index Finger;  Surgeon: Betha Loa, MD;  Location: [facility] OR;  Service: Orthopedics;  Laterality: Left;   COLONOSCOPY     ESOPHAGOGASTRODUODENOSCOPY     EXTRACORPOREAL SHOCK WAVE LITHOTRIPSY Left 07/24/2017   Procedure: EXTRACORPOREAL SHOCK WAVE LITHOTRIPSY (ESWL);  Surgeon: Vanna Scotland, MD;  Location: [facility] ORS;  Service: Urology;  Laterality: Left;   EXTRACORPOREAL SHOCK WAVE LITHOTRIPSY Right 12/11/2017   Procedure: EXTRACORPOREAL SHOCK WAVE LITHOTRIPSY (ESWL);  Surgeon: Vanna Scotland, MD;  Location: [facility] ORS;  Service: Urology;  Laterality: Right;   EYE SURGERY     INGUINAL  HERNIA REPAIR Right 08/08/2017   Procedure: LAPAROSCOPIC INGUINAL HERNIA;  Surgeon: Lattie Haw, MD;  Location: [facility] ORS;  Service: General;  Laterality: Right;   NERVE REPAIR Left 05/16/2018   Procedure: Cutaneous Nerve Repair;  Surgeon: Betha Loa, MD;  Location: [facility] OR;  Service: Orthopedics;  Laterality: Left;   SKIN FULL THICKNESS GRAFT Left 05/16/2018   Procedure: Skin Graft Full Thickness;  Surgeon: Betha Loa, MD;  Location: [facility] OR;  Service: Orthopedics;  Laterality: Left;   TENDON REPAIR Left 05/16/2018   Procedure: Extensor Tendon Repair;  Surgeon: Betha Loa, MD;  Location: [facility] OR;  Service: Orthopedics;  Laterality: Left;   TONSILLECTOMY     VASECTOMY      Home Medications:  Allergies as of 03/22/2021       Reactions   Penicillins Anaphylaxis, Other (See Comments)   Has patient had a PCN reaction causing immediate rash, facial/tongue/throat swelling, SOB or lightheadedness with hypotension: Yes Has patient had a PCN reaction causing severe rash involving mucus membranes or skin necrosis: No Has patient had a PCN reaction that required hospitalization: Yes Has patient had a PCN reaction occurring within the last 10 years: No If all of the above answers are "NO", then may proceed with Cephalosporin use.   Sulfa Antibiotics Swelling, Other (See Comments)   Childhood allergy- site of swelling not recalled   Keflex [cephalexin] Other (See Comments)   Childhood allergy- reaction not recalled  Medication List        Accurate as of March 22, 2021  2:19 PM. If you have any questions, ask your nurse or doctor.          aspirin EC 81 MG tablet Take 81 mg by mouth at bedtime.   atorvastatin 40 MG tablet Commonly known as: LIPITOR TAKE 1 TABLET DAILY   BD Pen Needle Micro U/F 32G X 6 MM Misc Generic drug: Insulin Pen Needle USE TO INJECT SAXENDA UNDER THE SKIN DAILY AS DIRECTED   cetirizine 10 MG tablet Commonly known as: ZYRTEC Take 1 tablet (10 mg total)  by mouth daily.   cyclobenzaprine 10 MG tablet Commonly known as: FLEXERIL Take 1 tablet (10 mg total) by mouth 3 (three) times daily as needed for muscle spasms.   escitalopram 10 MG tablet Commonly known as: LEXAPRO TAKE 1 TABLET DAILY   ferrous sulfate 325 (65 FE) MG tablet Take 325 mg by mouth daily with breakfast.   fluticasone 50 MCG/ACT nasal spray Commonly known as: FLONASE USE 2 SPRAYS IN EACH NOSTRIL DAILY   hydrochlorothiazide 25 MG tablet Commonly known as: HYDRODIURIL TAKE 1 TABLET DAILY   losartan 100 MG tablet Commonly known as: COZAAR TAKE 1 TABLET DAILY   Melatonin 10 MG Tabs Take 10 mg by mouth at bedtime.   meloxicam 15 MG tablet Commonly known as: MOBIC TAKE 1 TABLET DAILY   metFORMIN 500 MG tablet Commonly known as: GLUCOPHAGE TAKE 2 TABLETS TWICE A DAY WITH MEALS   Omega-3 Krill Oil 500 MG Caps Take 500 mg by mouth daily.   omeprazole 40 MG capsule Commonly known as: PRILOSEC Take 1 capsule (40 mg total) by mouth daily.   ondansetron 4 MG disintegrating tablet Commonly known as: ZOFRAN-ODT Take 1 tablet (4 mg total) by mouth every 8 (eight) hours as needed for nausea or vomiting.   ONE-A-DAY MENS HEALTH FORMULA PO Take 1 tablet by mouth daily.   oxyCODONE-acetaminophen 10-325 MG tablet Commonly known as: Percocet Take 1 tablet by mouth every 6 (six) hours as needed for pain.   Saxenda 18 MG/3ML Sopn Generic drug: Liraglutide -Weight Management INJECT 1/2 ML SUBCUTANEOUSLY ONCE A DAY (3MG  TOTAL)        Allergies:  Allergies  Allergen Reactions   Penicillins Anaphylaxis and Other (See Comments)    Has patient had a PCN reaction causing immediate rash, facial/tongue/throat swelling, SOB or lightheadedness with hypotension: Yes Has patient had a PCN reaction causing severe rash involving mucus membranes or skin necrosis: No Has patient had a PCN reaction that required hospitalization: Yes Has patient had a PCN reaction occurring  within the last 10 years: No If all of the above answers are "NO", then may proceed with Cephalosporin use.    Sulfa Antibiotics Swelling and Other (See Comments)    Childhood allergy- site of swelling not recalled   Keflex [Cephalexin] Other (See Comments)    Childhood allergy- reaction not recalled    Family History: Family History  Problem Relation Age of Onset   Cancer Mother        mass on liver   Heart disease Father    Diabetes Father    Hyperlipidemia Father    Diabetes Sister    Heart disease Paternal Grandfather    Bladder Cancer Neg Hx    Kidney cancer Neg Hx    Prostate cancer Neg Hx     Social History:  reports that he has never smoked. He has never used smokeless  tobacco. He reports current alcohol use. He reports that he does not use drugs.   Physical Exam: BP (!) 153/85   Pulse (!) 105   Ht 5\' 9"  (1.753 m)   Wt 280 lb (127 kg)   BMI 41.35 kg/m   Constitutional:  Alert and oriented, No acute distress. HEENT: [place] AT, moist mucus membranes.  Trachea midline, no masses. Cardiovascular: No clubbing, cyanosis, or edema. Respiratory: Normal respiratory effort, no increased work of breathing. Psychiatric: Normal mood and affect.   Pertinent Imaging: CT images were personally reviewed and interpreted   CT Renal Stone Study  Narrative CLINICAL DATA:  Left flank pain and nausea since 06/30 this morning.  EXAM: CT ABDOMEN AND PELVIS WITHOUT CONTRAST  TECHNIQUE: Multidetector CT imaging of the abdomen and pelvis was performed following the standard protocol without IV contrast.  RADIATION DOSE REDUCTION: This exam was performed according to the departmental dose-optimization program which includes automated exposure control, adjustment of the mA and/or kV according to patient size and/or use of iterative reconstruction technique.  COMPARISON:  07/14/2017  FINDINGS: Lower chest: The lung bases are clear of acute process. No pleural effusion or  pulmonary lesions. The heart is normal in size. No pericardial effusion. The distal esophagus and aorta are unremarkable.  Hepatobiliary: Fatty infiltration of the liver but no hepatic lesions or intrahepatic biliary dilatation. The gallbladder is unremarkable. No common bile duct dilatation.  Pancreas: No mass, inflammation or ductal dilatation.  Spleen: Normal size. No focal lesions.  Adrenals/Urinary Tract: Adrenal glands are normal.  Moderate left-sided hydroureteronephrosis down to an obstructing 5 mm calculus located at the level of the iliac crest. No right-sided ureteral calculi. There are small bilateral renal calculi and a stable simple 4 cm left renal cyst. No worrisome renal lesions are identified without contrast. No bladder lesions.  Stomach/Bowel: The stomach, duodenum, small bowel and colon are unremarkable. No acute inflammatory changes, mass lesions or obstructive findings. The terminal ileum is normal. The appendix demonstrates several appendicoliths but no evidence of acute inflammation.  Vascular/Lymphatic: The aorta is normal in caliber. Scattered atherosclerotic calcifications involving the iliac arteries. No mesenteric of retroperitoneal mass or adenopathy. Small scattered lymph nodes are noted.  Reproductive: The prostate gland and seminal vesicles are unremarkable.  Other: No pelvic mass or adenopathy. No free pelvic fluid collections. No inguinal mass or adenopathy. No abdominal wall hernia or subcutaneous lesions. Evidence of prior right inguinal hernia repair.  Musculoskeletal: No significant bony findings.  IMPRESSION: 1. 5 mm obstructing left ureteral calculus at the iliac crest level. 2. Small bilateral renal calculi. 3. No worrisome renal or bladder lesions without contrast. 4. Fatty infiltration of the liver.   Electronically Signed By: Rudie Meyer M.D. On: 03/20/2021 10:16   Assessment & Plan:    1.  Left ureteral  calculus No significant symptoms since ED discharge KUB today and will notify with results Rx tamsulosin sent to pharmacy Patient given strainer Due to minimal symptoms he desires medical expulsion therapy SWL and ureteroscopy were also discussed  2.  Bilateral nephrolithiasis Small renal calculi seen on CT   Riki Altes, MD  [facility] [address], [address] [place], [place] [address] [phone]

## 2021-03-23 LAB — URINALYSIS, COMPLETE
Bilirubin, UA: NEGATIVE
Glucose, UA: NEGATIVE
Ketones, UA: NEGATIVE
Leukocytes,UA: NEGATIVE
Nitrite, UA: NEGATIVE
Protein,UA: NEGATIVE
Specific Gravity, UA: 1.02 (ref 1.005–1.030)
Urobilinogen, Ur: 0.2 mg/dL (ref 0.2–1.0)
pH, UA: 7 (ref 5.0–7.5)

## 2021-03-23 LAB — MICROSCOPIC EXAMINATION
Bacteria, UA: NONE SEEN
RBC, Urine: >30 /hpf — AB (ref 0–2)

## 2021-03-24 ENCOUNTER — Encounter: Payer: Self-pay | Admitting: Urology

## 2021-03-25 ENCOUNTER — Encounter: Payer: Self-pay | Admitting: Urology

## 2021-03-26 ENCOUNTER — Other Ambulatory Visit: Payer: Self-pay | Admitting: *Deleted

## 2021-03-26 DIAGNOSIS — N201 Calculus of ureter: Secondary | ICD-10-CM

## 2021-03-30 ENCOUNTER — Encounter: Payer: Self-pay | Admitting: Urology

## 2021-04-03 ENCOUNTER — Ambulatory Visit
Admission: RE | Admit: 2021-04-03 | Discharge: 2021-04-03 | Disposition: A | Discharge status: Home / Self Care | Attending: Urology | Admitting: Urology

## 2021-04-03 ENCOUNTER — Ambulatory Visit
Admission: RE | Admit: 2021-04-03 | Discharge: 2021-04-03 | Disposition: A | Discharge status: Home / Self Care | Source: Ambulatory Visit | Attending: Urology | Admitting: Urology

## 2021-04-03 DIAGNOSIS — N201 Calculus of ureter: Secondary | ICD-10-CM | POA: Diagnosis not present

## 2021-04-05 ENCOUNTER — Ambulatory Visit (INDEPENDENT_AMBULATORY_CARE_PROVIDER_SITE_OTHER): Admitting: Family Medicine

## 2021-04-05 VITALS — BP 122/68 | HR 86 | Temp 97.3°F | Ht 69.0 in

## 2021-04-05 DIAGNOSIS — G4700 Insomnia, unspecified: Secondary | ICD-10-CM

## 2021-04-05 MED ORDER — ALPRAZOLAM 1 MG PO TABS: 1.0000 mg | ORAL_TABLET | Freq: Every evening | ORAL | 0 refills | Status: DC | PRN

## 2021-04-05 NOTE — Progress Notes (Signed)
? ?Subjective:  ? ? Patient ID: Zachary Gillespie, male    DOB: 08-Jul-1965, 56 y.o.   MRN: [id number] ?Patient states that he has been unable to sleep.  He wears a CPAP every night when he is compliant with this.  He states that his wife's snoring tends to keep him awake.  He reports that he started a new job.  He is on his feet more more active.  Therefore he feels like he should be more tired.  However since he lays down, his thoughts are pervasive.  He denies any anxiety or depression.  However he has a hard time shutting his mind off at night and tends to stay awake.  He has been taking melatonin 10 mg at night without benefit ?Past Medical History:  ?Diagnosis Date  ? DDD (degenerative disc disease), lumbar   ? Dyslipidemia   ? GERD (gastroesophageal reflux disease)   ? History of hiatal hernia   ? History of kidney stones   ? H/O  ? HLD (hyperlipidemia)   ? Hypertension   ? Obesity   ? Prediabetes   ? Sciatica of left side   ? Sleep apnea   ? AHI 34  ? Tubular adenoma of colon   ? 2019 (Dr. Benson Norway)  ? ?Past Surgical History:  ?Procedure Laterality Date  ? AMPUTATION Left 05/16/2018  ? Procedure: Ray Resection Left Index Finger;  Surgeon: [name], MD;  Location: [place];  Service: Orthopedics;  Laterality: Left;  ? COLONOSCOPY    ? ESOPHAGOGASTRODUODENOSCOPY    ? EXTRACORPOREAL SHOCK WAVE LITHOTRIPSY Left 07/24/2017  ? Procedure: EXTRACORPOREAL SHOCK WAVE LITHOTRIPSY (ESWL);  Surgeon: [name], MD;  Location: [facility] ORS;  Service: Urology;  Laterality: Left;  ? EXTRACORPOREAL SHOCK WAVE LITHOTRIPSY Right 12/11/2017  ? Procedure: EXTRACORPOREAL SHOCK WAVE LITHOTRIPSY (ESWL);  Surgeon: [name], MD;  Location: [facility] ORS;  Service: Urology;  Laterality: Right;  ? EYE SURGERY    ? INGUINAL HERNIA REPAIR Right 08/08/2017  ? Procedure: LAPAROSCOPIC INGUINAL HERNIA;  Surgeon: [name], MD;  Location: [facility] ORS;  Service: General;  Laterality: Right;  ? NERVE REPAIR Left 05/16/2018  ? Procedure: Cutaneous  Nerve Repair;  Surgeon: [name], MD;  Location: [place];  Service: Orthopedics;  Laterality: Left;  ? SKIN FULL THICKNESS GRAFT Left 05/16/2018  ? Procedure: Skin Graft Full Thickness;  Surgeon: [name], MD;  Location: [place];  Service: Orthopedics;  Laterality: Left;  ? TENDON REPAIR Left 05/16/2018  ? Procedure: Extensor Tendon Repair;  Surgeon: [name], MD;  Location: [place];  Service: Orthopedics;  Laterality: Left;  ? TONSILLECTOMY    ? VASECTOMY    ? ?Current Outpatient Medications on File Prior to Visit  ?Medication Sig Dispense Refill  ? aspirin EC 81 MG tablet Take 81 mg by mouth at bedtime.     ? atorvastatin (LIPITOR) 40 MG tablet TAKE 1 TABLET DAILY 90 tablet 3  ? BD PEN NEEDLE MICRO U/F 32G X 6 MM MISC USE TO INJECT SAXENDA UNDER THE SKIN DAILY AS DIRECTED 100 each 3  ? cetirizine (ZYRTEC) 10 MG tablet Take 1 tablet (10 mg total) by mouth daily. 90 tablet 3  ? cyclobenzaprine (FLEXERIL) 10 MG tablet Take 1 tablet (10 mg total) by mouth 3 (three) times daily as needed for muscle spasms. 30 tablet 0  ? escitalopram (LEXAPRO) 10 MG tablet TAKE 1 TABLET DAILY 90 tablet 3  ? ferrous sulfate 325 (65 FE) MG tablet Take 325 mg by  mouth daily with breakfast.    ? fluticasone (FLONASE) 50 MCG/ACT nasal spray USE 2 SPRAYS IN EACH NOSTRIL DAILY 48 g 3  ? hydrochlorothiazide (HYDRODIURIL) 25 MG tablet TAKE 1 TABLET DAILY 90 tablet 3  ? losartan (COZAAR) 100 MG tablet TAKE 1 TABLET DAILY 90 tablet 3  ? Melatonin 10 MG TABS Take 10 mg by mouth at bedtime.     ? meloxicam (MOBIC) 15 MG tablet TAKE 1 TABLET DAILY 30 tablet 11  ? metFORMIN (GLUCOPHAGE) 500 MG tablet TAKE 2 TABLETS TWICE A DAY WITH MEALS 360 tablet 3  ? Multiple Vitamins-Minerals (ONE-A-DAY MENS HEALTH FORMULA PO) Take 1 tablet by mouth daily.     ? Omega-3 Krill Oil 500 MG CAPS Take 500 mg by mouth daily.     ? omeprazole (PRILOSEC) 40 MG capsule Take 1 capsule (40 mg total) by mouth daily. 90 capsule 3  ? ondansetron (ZOFRAN-ODT) 4 MG  disintegrating tablet Take 1 tablet (4 mg total) by mouth every 8 (eight) hours as needed for nausea or vomiting. 20 tablet 0  ? oxyCODONE-acetaminophen (PERCOCET) 10-325 MG tablet Take 1 tablet by mouth every 6 (six) hours as needed for pain. 20 tablet 0  ? SAXENDA 18 MG/3ML SOPN INJECT 1/2 ML SUBCUTANEOUSLY ONCE A DAY ('3MG'$  TOTAL) 15 mL 1  ? tamsulosin (FLOMAX) 0.4 MG CAPS capsule Take 1 capsule (0.4 mg total) by mouth daily. 14 capsule 0  ? ?No current facility-administered medications on file prior to visit.  ? ?Allergies  ?Allergen Reactions  ? Penicillins Anaphylaxis and Other (See Comments)  ?  Has patient had a PCN reaction causing immediate rash, facial/tongue/throat swelling, SOB or lightheadedness with hypotension: Yes ?Has patient had a PCN reaction causing severe rash involving mucus membranes or skin necrosis: No ?Has patient had a PCN reaction that required hospitalization: Yes ?Has patient had a PCN reaction occurring within the last 10 years: No ?If all of the above answers are "NO", then may proceed with Cephalosporin use. ?  ? Sulfa Antibiotics Swelling and Other (See Comments)  ?  Childhood allergy- site of swelling not recalled  ? Keflex [Cephalexin] Other (See Comments)  ?  Childhood allergy- reaction not recalled  ? ?Social History  ? ?Socioeconomic History  ? Marital status: Married  ?  Spouse name: Not on file  ? Number of children: 1  ? Years of education: college  ? Highest education level: Not on file  ?Occupational History  ? Not on file  ?Tobacco Use  ? Smoking status: Never  ? Smokeless tobacco: Never  ?Vaping Use  ? Vaping Use: Never used  ?Substance and Sexual Activity  ? Alcohol use: Yes  ?  Comment: RARE  ? Drug use: No  ? Sexual activity: Yes  ?Other Topics Concern  ? Not on file  ?Social History Narrative  ? Drinks about 1 cup of tea a day   ? ?Social Determinants of Health  ? ?Financial Resource Strain: Not on file  ?Food Insecurity: Not on file  ?Transportation Needs: Not on  file  ?Physical Activity: Not on file  ?Stress: Not on file  ?Social Connections: Not on file  ?Intimate Partner Violence: Not on file  ? ? ? ? ?Review of Systems  ?Musculoskeletal:  Positive for back pain.  ?All other systems reviewed and are negative. ? ?   ?Objective:  ? Physical Exam ?Vitals reviewed.  ?Constitutional:   ?   Appearance: Normal appearance. He is obese.  ?Cardiovascular:  ?  Rate and Rhythm: Normal rate and regular rhythm.  ?   Heart sounds: Normal heart sounds.  ?Pulmonary:  ?   Effort: Pulmonary effort is normal. No respiratory distress.  ?   Breath sounds: Normal breath sounds. No wheezing, rhonchi or rales.  ?Neurological:  ?   Mental Status: He is alert.  ?Psychiatric:     ?   Mood and Affect: Mood normal.     ?   Behavior: Behavior normal.     ?   Thought Content: Thought content normal.     ?   Judgment: Judgment normal.  ? ? ? ? ? ?   ?Assessment & Plan:  ?Insomnia, unspecified type ?Begin Xanax 1 mg p.o. nightly as needed insomnia.  After a week, hopefully we will reestablish his normal sleep-wake pattern, he can discontinue the medicine.  We discussed lifestyle changes including a regular routine and bedtime schedule, reducing caffeine, using white noise at night, daily exercise.  He is already tried instituting these measures without success. ?

## 2021-04-08 ENCOUNTER — Encounter: Payer: Self-pay | Admitting: Urology

## 2021-04-09 ENCOUNTER — Other Ambulatory Visit: Payer: Self-pay | Admitting: Urology

## 2021-04-09 DIAGNOSIS — N201 Calculus of ureter: Secondary | ICD-10-CM

## 2021-04-15 ENCOUNTER — Encounter: Payer: Self-pay | Admitting: Urology

## 2021-04-16 ENCOUNTER — Other Ambulatory Visit: Payer: Self-pay | Admitting: Family Medicine

## 2021-04-16 ENCOUNTER — Other Ambulatory Visit: Payer: Self-pay | Admitting: *Deleted

## 2021-04-16 DIAGNOSIS — I10 Essential (primary) hypertension: Secondary | ICD-10-CM

## 2021-04-16 MED ORDER — TAMSULOSIN HCL 0.4 MG PO CAPS: 0.4000 mg | ORAL_CAPSULE | Freq: Every day | ORAL | 0 refills | Status: DC

## 2021-04-18 ENCOUNTER — Ambulatory Visit (HOSPITAL_COMMUNITY)
Admission: RE | Admit: 2021-04-18 | Discharge: 2021-04-18 | Disposition: A | Discharge status: Home / Self Care | Source: Ambulatory Visit | Attending: Urology | Admitting: Urology

## 2021-04-18 DIAGNOSIS — N201 Calculus of ureter: Secondary | ICD-10-CM | POA: Diagnosis present

## 2021-04-22 ENCOUNTER — Telehealth: Payer: Self-pay | Admitting: Urology

## 2021-04-22 NOTE — Telephone Encounter (Signed)
CT reviewed and stone is still present in the mid ureter overlying the pelvic bones.  This is an area where typically the stone will get hung up and unlikely he will be able to pass.  Recommend scheduling ureteroscopic stone removal.  Please let me know if he has any questions and if not will send a scheduling sheet to [place] ?

## 2021-04-23 ENCOUNTER — Other Ambulatory Visit: Payer: Self-pay | Admitting: Family Medicine

## 2021-04-23 NOTE — Telephone Encounter (Signed)
Notified patient as instructed, patient would like the stone removed my the end of the month .  ?

## 2021-04-26 ENCOUNTER — Other Ambulatory Visit: Payer: Self-pay | Admitting: Urology

## 2021-04-26 DIAGNOSIS — N201 Calculus of ureter: Secondary | ICD-10-CM

## 2021-04-26 NOTE — Telephone Encounter (Signed)
Scheduling sheet sent to [facility] 

## 2021-04-26 NOTE — Progress Notes (Signed)
Surgical Physician Order Form [facility] [place] ? ?* Scheduling expectation : Next Available ? ?*Length of Case: 45 minutes ? ?*Clearance needed: no ? ?*Anticoagulation Instructions: N/A ? ?*Aspirin Instructions: N/A ? ?*Post-op visit Date/Instructions:  1 week cysto stent removal ? ?*Diagnosis: Left Ureteral Stone ? ?*Procedure: Left Ureteroscopy w/laser lithotripsy & stent placement (33174) ? ? ?Additional orders: N/A ? ?-Admit type: OUTpatient ? ?-Anesthesia: Choice ? ?-VTE Prophylaxis Standing Order SCD?s    ?   ?Other:  ? ?-Standing Lab Orders Per Anesthesia   ? ?Lab other: UA&Urine Culture ? ?-Standing Test orders EKG/Chest x-ray per Anesthesia      ? ?Test other:  ? ?- Medications:  Gentamicin per pharmacy ? ?-Other orders:  N/A ? ? ? ?  ? ?

## 2021-04-27 ENCOUNTER — Telehealth: Payer: Self-pay

## 2021-04-27 NOTE — Progress Notes (Signed)
[facility] Posting Form  ? ?Surgery Date/Time: Date: 05/15/2021 ? ?Surgeon: Dr. John Giovanni, MD ? ?Surgery Location: Day Surgery ? ?Inpt ( No  )   Outpt (Yes)   Obs ( No  )  ? ?Diagnosis: N20.1 Left Ureteral Stone ? ?-CPT: 48347 ? ?Surgery: Left Ureteroscopy with laser lithotripsy and Stent Placement ? ?Stop Anticoagulations: No ? ?Cardiac/Medical/Pulmonary Clearance needed: no ? ?*Orders entered into EPIC  Date: 04/27/21  ? ?*Case booked in [place]  Date: 04/27/21 ? ?*Notified pt of Surgery: Date: 04/27/21 ? ?PRE-OP UA & CX: Yes, will obtain on 05/04/2021 ? ?*Placed into Prior Authorization Work Fabio Bering Date: 04/27/21 ? ? ?Assistant/laser/rep:No ? ? ? ? ? ? ? ? ? ? ? ? ? ? ? ?

## 2021-04-27 NOTE — Telephone Encounter (Signed)
I spoke with Zachary Gillespie. We have discussed possible surgery dates and Tuesday May 9th, 2023 was agreed upon by all parties. Patient given information about surgery date, what to expect pre-operatively and post operatively.  ?We discussed that a Pre-Admission Testing office will be calling to set up the pre-op visit that will take place prior to surgery, and that these appointments are typically done over the phone with a Pre-Admissions RN.  ?Informed patient that our office will communicate any additional care to be provided after surgery. Patients questions or concerns were discussed during our call. Advised to call our office should there be any additional information, questions or concerns that arise. Patient verbalized understanding.   ?

## 2021-05-02 ENCOUNTER — Other Ambulatory Visit: Payer: Self-pay | Admitting: Family Medicine

## 2021-05-04 ENCOUNTER — Other Ambulatory Visit

## 2021-05-04 DIAGNOSIS — N201 Calculus of ureter: Secondary | ICD-10-CM

## 2021-05-04 LAB — URINALYSIS, COMPLETE
Bilirubin, UA: NEGATIVE
Glucose, UA: NEGATIVE
Ketones, UA: NEGATIVE
Leukocytes,UA: NEGATIVE
Nitrite, UA: NEGATIVE
Protein,UA: NEGATIVE
Specific Gravity, UA: >1.03 — ABNORMAL HIGH (ref 1.005–1.030)
Urobilinogen, Ur: 0.2 mg/dL (ref 0.2–1.0)
pH, UA: 6.5 (ref 5.0–7.5)

## 2021-05-04 LAB — MICROSCOPIC EXAMINATION: Bacteria, UA: NONE SEEN

## 2021-05-09 ENCOUNTER — Encounter
Admission: RE | Admit: 2021-05-09 | Discharge: 2021-05-09 | Disposition: A | Discharge status: Home / Self Care | Source: Ambulatory Visit | Attending: Urology | Admitting: Urology

## 2021-05-09 DIAGNOSIS — E119 Type 2 diabetes mellitus without complications: Secondary | ICD-10-CM

## 2021-05-09 DIAGNOSIS — I10 Essential (primary) hypertension: Secondary | ICD-10-CM

## 2021-05-09 DIAGNOSIS — Z01818 Encounter for other preprocedural examination: Secondary | ICD-10-CM

## 2021-05-09 DIAGNOSIS — Z79899 Other long term (current) drug therapy: Secondary | ICD-10-CM

## 2021-05-09 HISTORY — DX: Anemia, unspecified: D64.9

## 2021-05-09 LAB — CULTURE, URINE COMPREHENSIVE

## 2021-05-09 NOTE — Patient Instructions (Signed)
Your procedure is scheduled on:05-15-21 Tuesday ?Report to the Registration Desk on the 1st floor of the [facility].Then proceed to the 2nd floor Surgery Desk  ?To find out your arrival time, please call [phone] between 1PM - 3PM on:05-14-21 Monday ?If your arrival time is 6:00 am, do not arrive prior to that time as the [facility] entrance doors do not open until 6:00 am. ? ?REMEMBER: ?Instructions that are not followed completely may result in serious medical risk, up to and including death; or upon the discretion of your surgeon and anesthesiologist your surgery may need to be rescheduled. ? ?Do not eat food OR drink any liquids after midnight the night before surgery.  ?No gum chewing, lozengers or hard candies. ? ?TAKE THESE MEDICATIONS THE MORNING OF SURGERY WITH A SIP OF WATER: ?-omeprazole (PRILOSEC)-take one the night before and one on the morning of surgery - helps to prevent nausea after surgery.) ? ?Stop your metFORMIN (GLUCOPHAGE) 2 day prior to surgery-Last dose on 05-12-21 Saturday ? ?Stop your aspirin EC 81 MG NOW (05-09-21)  ? ?One week prior to surgery: ?Stop Anti-inflammatories (NSAIDS) such as Mobic (Meloxicam), Advil, Aleve, Ibuprofen, Motrin, Naproxen, Naprosyn and Aspirin based products such as Excedrin, Goodys Powder, BC Powder.You may however, take Tylenol/Percocet if needed for pain up until the day of surgery. ?Stop ANY OVER THE COUNTER supplements/vitamins NOW (05-09-21) until after surgery (ferrous sulfate, Omega-3 Krill Oil , vitamin C and Multiple Vitamins) You may continue your Melatonin up until the night prior to surgery ? ?No Alcohol for 24 hours before or after surgery. ? ?No Smoking including e-cigarettes for 24 hours prior to surgery.  ?No chewable tobacco products for at least 6 hours prior to surgery.  ?No nicotine patches on the day of surgery. ? ?Do not use any "recreational" drugs for at least a week prior to your surgery.  ?Please be advised that the combination of  cocaine and anesthesia may have negative outcomes, up to and including death. ?If you test positive for cocaine, your surgery will be cancelled. ? ?On the morning of surgery brush your teeth with toothpaste and water, you may rinse your mouth with mouthwash if you wish. ?Do not swallow any toothpaste or mouthwash. ? ?Do not wear jewelry, make-up, hairpins, clips or nail polish. ? ?Do not wear lotions, powders, or perfumes.  ? ?Do not shave body from the neck down 48 hours prior to surgery just in case you cut yourself which could leave a site for infection.  ?Also, freshly shaved skin may become irritated if using the CHG soap. ? ?Contact lenses, hearing aids and dentures may not be worn into surgery. ? ?Do not bring valuables to the hospital. [facility] is not responsible for any missing/lost belongings or valuables.  ? ?Bring your C-PAP to the hospital with you ? ?Notify your doctor if there is any change in your medical condition (cold, fever, infection). ? ?Wear comfortable clothing (specific to your surgery type) to the hospital. ? ?After surgery, you can help prevent lung complications by doing breathing exercises.  ?Take deep breaths and cough every 1-2 hours. Your doctor may order a device called an Incentive Spirometer to help you take deep breaths. ?When coughing or sneezing, hold a pillow firmly against your incision with both hands. This is called ?splinting.? Doing this helps protect your incision. It also decreases belly discomfort. ? ?If you are being admitted to the hospital overnight, leave your suitcase in the car. ?After surgery it may  be brought to your room. ? ?If you are being discharged the day of surgery, you will not be allowed to drive home. ?You will need a responsible adult (18 years or older) to drive you home and stay with you that night.  ? ?If you are taking public transportation, you will need to have a responsible adult (18 years or older) with you. ?Please confirm with your  physician that it is acceptable to use public transportation.  ? ?Please call the [facility] Dept. at [phone] if you have any questions about these instructions. ? ?Surgery Visitation Policy: ? ?Patients undergoing a surgery or procedure may have two family members or support persons with them as long as the person is not COVID-19 positive or experiencing its symptoms.  ?

## 2021-05-11 ENCOUNTER — Encounter
Admission: RE | Admit: 2021-05-11 | Discharge: 2021-05-11 | Disposition: A | Discharge status: Home / Self Care | Source: Ambulatory Visit | Attending: Urology | Admitting: Urology

## 2021-05-11 DIAGNOSIS — Z79899 Other long term (current) drug therapy: Secondary | ICD-10-CM | POA: Diagnosis not present

## 2021-05-11 DIAGNOSIS — Z01818 Encounter for other preprocedural examination: Secondary | ICD-10-CM | POA: Diagnosis present

## 2021-05-11 DIAGNOSIS — E119 Type 2 diabetes mellitus without complications: Secondary | ICD-10-CM | POA: Diagnosis not present

## 2021-05-11 DIAGNOSIS — I10 Essential (primary) hypertension: Secondary | ICD-10-CM | POA: Diagnosis not present

## 2021-05-11 LAB — POTASSIUM: Potassium: 3.5 mmol/L (ref 3.5–5.1)

## 2021-05-14 ENCOUNTER — Telehealth: Payer: Self-pay

## 2021-05-14 MED ORDER — CIPROFLOXACIN HCL 250 MG PO TABS: 250.0000 mg | ORAL_TABLET | Freq: Two times a day (BID) | ORAL | 0 refills | Status: AC

## 2021-05-14 NOTE — Telephone Encounter (Signed)
-----   Message from [name], MD sent at 05/11/2021  7:43 AM EDT ----- ?Urine culture growing a low level of strep.  Please send in Rx Cipro 250 mg twice daily x5 days to start on Saturday ?

## 2021-05-15 ENCOUNTER — Other Ambulatory Visit: Payer: Self-pay

## 2021-05-15 ENCOUNTER — Ambulatory Visit: Admitting: Anesthesiology

## 2021-05-15 ENCOUNTER — Ambulatory Visit
Admission: RE | Admit: 2021-05-15 | Discharge: 2021-05-15 | Disposition: A | Discharge status: Home / Self Care | Source: Ambulatory Visit | Attending: Urology | Admitting: Urology

## 2021-05-15 ENCOUNTER — Ambulatory Visit: Admitting: Urgent Care

## 2021-05-15 ENCOUNTER — Encounter: Payer: Self-pay | Admitting: Urology

## 2021-05-15 ENCOUNTER — Encounter
Admission: RE | Disposition: A | Discharge status: Home / Self Care | Payer: Self-pay | Source: Ambulatory Visit | Attending: Urology

## 2021-05-15 ENCOUNTER — Ambulatory Visit

## 2021-05-15 DIAGNOSIS — Z7985 Long-term (current) use of injectable non-insulin antidiabetic drugs: Secondary | ICD-10-CM | POA: Insufficient documentation

## 2021-05-15 DIAGNOSIS — D649 Anemia, unspecified: Secondary | ICD-10-CM | POA: Diagnosis not present

## 2021-05-15 DIAGNOSIS — E119 Type 2 diabetes mellitus without complications: Secondary | ICD-10-CM | POA: Diagnosis not present

## 2021-05-15 DIAGNOSIS — Z6841 Body Mass Index (BMI) 40.0 and over, adult: Secondary | ICD-10-CM | POA: Diagnosis not present

## 2021-05-15 DIAGNOSIS — I10 Essential (primary) hypertension: Secondary | ICD-10-CM | POA: Insufficient documentation

## 2021-05-15 DIAGNOSIS — Z7984 Long term (current) use of oral hypoglycemic drugs: Secondary | ICD-10-CM | POA: Diagnosis not present

## 2021-05-15 DIAGNOSIS — G4730 Sleep apnea, unspecified: Secondary | ICD-10-CM | POA: Insufficient documentation

## 2021-05-15 DIAGNOSIS — K219 Gastro-esophageal reflux disease without esophagitis: Secondary | ICD-10-CM | POA: Insufficient documentation

## 2021-05-15 DIAGNOSIS — M1990 Unspecified osteoarthritis, unspecified site: Secondary | ICD-10-CM | POA: Insufficient documentation

## 2021-05-15 DIAGNOSIS — N201 Calculus of ureter: Secondary | ICD-10-CM | POA: Insufficient documentation

## 2021-05-15 DIAGNOSIS — Z79899 Other long term (current) drug therapy: Secondary | ICD-10-CM | POA: Diagnosis not present

## 2021-05-15 HISTORY — PX: CYSTOSCOPY/URETEROSCOPY/HOLMIUM LASER/STENT PLACEMENT: SHX6546

## 2021-05-15 LAB — GLUCOSE, CAPILLARY
Glucose-Capillary: 126 mg/dL — ABNORMAL HIGH (ref 70–99)
Glucose-Capillary: 115 mg/dL — ABNORMAL HIGH (ref 70–99)

## 2021-05-15 SURGERY — CYSTOSCOPY/URETEROSCOPY/HOLMIUM LASER/STENT PLACEMENT
Anesthesia: General | Laterality: Left

## 2021-05-15 MED ORDER — ORAL CARE MOUTH RINSE: 15.0000 mL | Freq: Once | OROMUCOSAL | Status: AC

## 2021-05-15 MED ORDER — OXYBUTYNIN CHLORIDE 5 MG PO TABS: ORAL_TABLET | ORAL | 0 refills | Status: DC

## 2021-05-15 MED ORDER — ONDANSETRON HCL 4 MG/2ML IJ SOLN
INTRAMUSCULAR | Status: AC
  Filled 2021-05-15: qty 2

## 2021-05-15 MED ORDER — CHLORHEXIDINE GLUCONATE 0.12 % MT SOLN
15.0000 mL | Freq: Once | OROMUCOSAL | Status: AC
  Administered 2021-05-15: 15 mL via OROMUCOSAL

## 2021-05-15 MED ORDER — ROCURONIUM BROMIDE 10 MG/ML (PF) SYRINGE
PREFILLED_SYRINGE | INTRAVENOUS | Status: AC
  Filled 2021-05-15: qty 10

## 2021-05-15 MED ORDER — OXYCODONE HCL 5 MG PO TABS
5.0000 mg | ORAL_TABLET | Freq: Once | ORAL | Status: AC | PRN
  Administered 2021-05-15: 5 mg via ORAL

## 2021-05-15 MED ORDER — SODIUM CHLORIDE 0.9 % IV SOLN: INTRAVENOUS | Status: DC

## 2021-05-15 MED ORDER — LIDOCAINE HCL (PF) 2 % IJ SOLN
INTRAMUSCULAR | Status: AC
  Filled 2021-05-15: qty 5

## 2021-05-15 MED ORDER — LIDOCAINE HCL (CARDIAC) PF 100 MG/5ML IV SOSY
PREFILLED_SYRINGE | INTRAVENOUS | Status: DC | PRN
  Administered 2021-05-15: 100 mg via INTRAVENOUS

## 2021-05-15 MED ORDER — MIDAZOLAM HCL 2 MG/2ML IJ SOLN
INTRAMUSCULAR | Status: AC
Start: 2021-05-15 — End: ?
  Filled 2021-05-15: qty 2

## 2021-05-15 MED ORDER — DEXMEDETOMIDINE (PRECEDEX) IN NS 20 MCG/5ML (4 MCG/ML) IV SYRINGE
PREFILLED_SYRINGE | INTRAVENOUS | Status: DC | PRN
  Administered 2021-05-15: 16 ug via INTRAVENOUS

## 2021-05-15 MED ORDER — SUCCINYLCHOLINE CHLORIDE 200 MG/10ML IV SOSY
PREFILLED_SYRINGE | INTRAVENOUS | Status: DC | PRN
  Administered 2021-05-15: 140 mg via INTRAVENOUS

## 2021-05-15 MED ORDER — OXYCODONE HCL 5 MG/5ML PO SOLN: 5.0000 mg | Freq: Once | ORAL | Status: AC | PRN

## 2021-05-15 MED ORDER — MIDAZOLAM HCL 2 MG/2ML IJ SOLN
INTRAMUSCULAR | Status: DC | PRN
  Administered 2021-05-15: 2 mg via INTRAVENOUS

## 2021-05-15 MED ORDER — OXYCODONE-ACETAMINOPHEN 10-325 MG PO TABS: 1.0000 | ORAL_TABLET | Freq: Four times a day (QID) | ORAL | 0 refills | Status: DC | PRN

## 2021-05-15 MED ORDER — DEXAMETHASONE SODIUM PHOSPHATE 10 MG/ML IJ SOLN
INTRAMUSCULAR | Status: DC | PRN
  Administered 2021-05-15: 10 mg via INTRAVENOUS

## 2021-05-15 MED ORDER — OXYCODONE HCL 5 MG PO TABS
ORAL_TABLET | ORAL | Status: AC
  Filled 2021-05-15: qty 1

## 2021-05-15 MED ORDER — SODIUM CHLORIDE 0.9 % IR SOLN
Status: DC | PRN
  Administered 2021-05-15: 3000 mL via INTRAVESICAL

## 2021-05-15 MED ORDER — SUGAMMADEX SODIUM 500 MG/5ML IV SOLN
INTRAVENOUS | Status: DC | PRN
  Administered 2021-05-15: 400 mg via INTRAVENOUS

## 2021-05-15 MED ORDER — ONDANSETRON HCL 4 MG/2ML IJ SOLN: 4.0000 mg | Freq: Once | INTRAMUSCULAR | Status: DC | PRN

## 2021-05-15 MED ORDER — LACTATED RINGERS IV SOLN: INTRAVENOUS | Status: DC

## 2021-05-15 MED ORDER — ROCURONIUM BROMIDE 100 MG/10ML IV SOLN
INTRAVENOUS | Status: DC | PRN
  Administered 2021-05-15: 30 mg via INTRAVENOUS

## 2021-05-15 MED ORDER — OXYBUTYNIN CHLORIDE 5 MG PO TABS
ORAL_TABLET | ORAL | Status: AC
  Filled 2021-05-15: qty 1

## 2021-05-15 MED ORDER — SUCCINYLCHOLINE CHLORIDE 200 MG/10ML IV SOSY
PREFILLED_SYRINGE | INTRAVENOUS | Status: AC
  Filled 2021-05-15: qty 10

## 2021-05-15 MED ORDER — ACETAMINOPHEN 10 MG/ML IV SOLN: 1000.0000 mg | Freq: Once | INTRAVENOUS | Status: DC | PRN

## 2021-05-15 MED ORDER — GENTAMICIN SULFATE 40 MG/ML IJ SOLN
5.0000 mg/kg | INTRAVENOUS | Status: AC
  Administered 2021-05-15: 640 mg via INTRAVENOUS
  Filled 2021-05-15: qty 16

## 2021-05-15 MED ORDER — DEXAMETHASONE SODIUM PHOSPHATE 10 MG/ML IJ SOLN
INTRAMUSCULAR | Status: AC
  Filled 2021-05-15: qty 1

## 2021-05-15 MED ORDER — SUGAMMADEX SODIUM 500 MG/5ML IV SOLN
INTRAVENOUS | Status: AC
  Filled 2021-05-15: qty 5

## 2021-05-15 MED ORDER — OXYBUTYNIN CHLORIDE 5 MG PO TABS
5.0000 mg | ORAL_TABLET | Freq: Three times a day (TID) | ORAL | Status: DC | PRN
  Administered 2021-05-15: 5 mg via ORAL

## 2021-05-15 MED ORDER — ONDANSETRON HCL 4 MG/2ML IJ SOLN
INTRAMUSCULAR | Status: DC | PRN
  Administered 2021-05-15: 4 mg via INTRAVENOUS

## 2021-05-15 MED ORDER — FENTANYL CITRATE (PF) 100 MCG/2ML IJ SOLN: 25.0000 ug | INTRAMUSCULAR | Status: DC | PRN

## 2021-05-15 MED ORDER — FENTANYL CITRATE (PF) 100 MCG/2ML IJ SOLN
INTRAMUSCULAR | Status: AC
Start: 2021-05-15 — End: ?
  Filled 2021-05-15: qty 2

## 2021-05-15 MED ORDER — IOHEXOL 180 MG/ML  SOLN
INTRAMUSCULAR | Status: DC | PRN
Start: 2021-05-15 — End: 2021-05-15
  Administered 2021-05-15: 10 mL

## 2021-05-15 MED ORDER — CHLORHEXIDINE GLUCONATE 0.12 % MT SOLN
OROMUCOSAL | Status: AC
  Filled 2021-05-15: qty 15

## 2021-05-15 MED ORDER — PROPOFOL 10 MG/ML IV BOLUS
INTRAVENOUS | Status: DC | PRN
  Administered 2021-05-15: 50 mg via INTRAVENOUS
  Administered 2021-05-15: 100 mg via INTRAVENOUS
  Administered 2021-05-15: 200 mg via INTRAVENOUS

## 2021-05-15 MED ORDER — PROPOFOL 500 MG/50ML IV EMUL
INTRAVENOUS | Status: AC
  Filled 2021-05-15: qty 50

## 2021-05-15 SURGICAL SUPPLY — 28 items
BAG DRAIN CYSTO-URO LG1000N (MISCELLANEOUS) ×2 IMPLANT
BASKET ZERO TIP 1.9FR (BASKET) ×1 IMPLANT
BRUSH SCRUB EZ 1% IODOPHOR (MISCELLANEOUS) ×2 IMPLANT
BSKT STON RTRVL ZERO TP 1.9FR (BASKET) ×1
CATH URET FLEX-TIP 2 LUMEN 10F (CATHETERS) IMPLANT
CATH URETL OPEN END 6X70 (CATHETERS) IMPLANT
CNTNR SPEC 2.5X3XGRAD LEK (MISCELLANEOUS)
CONT SPEC 4OZ STER OR WHT (MISCELLANEOUS)
CONT SPEC 4OZ STRL OR WHT (MISCELLANEOUS)
CONTAINER SPEC 2.5X3XGRAD LEK (MISCELLANEOUS) IMPLANT
DRAPE UTILITY 15X26 TOWEL STRL (DRAPES) ×2 IMPLANT
GLOVE SURG UNDER POLY LF SZ7.5 (GLOVE) ×2 IMPLANT
GOWN STRL REUS W/ TWL LRG LVL3 (GOWN DISPOSABLE) ×1 IMPLANT
GOWN STRL REUS W/ TWL XL LVL3 (GOWN DISPOSABLE) ×1 IMPLANT
GOWN STRL REUS W/TWL LRG LVL3 (GOWN DISPOSABLE) ×2
GOWN STRL REUS W/TWL XL LVL3 (GOWN DISPOSABLE) ×2
GUIDEWIRE STR DUAL SENSOR (WIRE) ×2 IMPLANT
IV NS IRRIG 3000ML ARTHROMATIC (IV SOLUTION) ×2 IMPLANT
KIT TURNOVER CYSTO (KITS) ×2 IMPLANT
PACK CYSTO AR (MISCELLANEOUS) ×2 IMPLANT
SET CYSTO W/LG BORE CLAMP LF (SET/KITS/TRAYS/PACK) ×2 IMPLANT
SHEATH NAVIGATOR HD 12/14X36 (SHEATH) IMPLANT
STENT URET 6FRX24 CONTOUR (STENTS) IMPLANT
STENT URET 6FRX26 CONTOUR (STENTS) ×1 IMPLANT
SURGILUBE 2OZ TUBE FLIPTOP (MISCELLANEOUS) ×2 IMPLANT
TRACTIP FLEXIVA PULSE ID 200 (Laser) ×2 IMPLANT
VALVE UROSEAL ADJ ENDO (VALVE) ×1 IMPLANT
WATER STERILE IRR 500ML POUR (IV SOLUTION) ×2 IMPLANT

## 2021-05-15 NOTE — Anesthesia Preprocedure Evaluation (Addendum)
Anesthesia Evaluation  ?Patient identified by MRN, date of birth, ID band ?Patient awake ? ? ? ?Reviewed: ?Allergy & Precautions, NPO status , Patient's Chart, lab work & pertinent test results ? ?History of Anesthesia Complications ?Negative for: history of anesthetic complications ? ?Airway ?Mallampati: IV ? ? ?Neck ROM: Full ? ? ? Dental ? ?(+) Missing ?  ?Pulmonary ?sleep apnea ,  ?  ?Pulmonary exam normal ?breath sounds clear to auscultation ? ? ? ? ? ? Cardiovascular ?hypertension, Normal cardiovascular exam ?Rhythm:Regular Rate:Normal ? ?ECG 05/11/21:  ?Normal sinus rhythm ?Left axis deviation ?Abnormal ECG ?When compared with ECG of 05-Aug-2017 11:30, ?No significant change was found ?  ?Neuro/Psych ?negative neurological ROS ?   ? GI/Hepatic ?hiatal hernia, GERD  Medicated and Controlled,  ?Endo/Other  ?diabetes, Type 2Class 3 obesity ? Renal/GU ?Renal disease (nephrolithiasis)  ? ?  ?Musculoskeletal ? ?(+) Arthritis ,  ? Abdominal ?  ?Peds ? Hematology ? ?(+) Blood dyscrasia, anemia ,   ?Anesthesia Other Findings ? ? Reproductive/Obstetrics ? ?  ? ? ? ? ? ? ? ? ? ? ? ? ? ?  ?  ? ? ? ? ? ? ? ?Anesthesia Physical ?Anesthesia Plan ? ?ASA: 3 ? ?Anesthesia Plan: General  ? ?Post-op Pain Management:   ? ?Induction: Intravenous ? ?PONV Risk Score and Plan: 2 and Ondansetron, Dexamethasone and Treatment may vary due to age or medical condition ? ?Airway Management Planned: LMA ? ?Additional Equipment:  ? ?Intra-op Plan:  ? ?Post-operative Plan: Extubation in OR ? ?Informed Consent: I have reviewed the patients History and Physical, chart, labs and discussed the procedure including the risks, benefits and alternatives for the proposed anesthesia with the patient or authorized representative who has indicated his/her understanding and acceptance.  ? ? ? ?Dental advisory given ? ?Plan Discussed with: CRNA ? ?Anesthesia Plan Comments: (Patient consented for risks of anesthesia including  but not limited to:  ?- adverse reactions to medications ?- damage to eyes, teeth, lips or other oral mucosa ?- nerve damage due to positioning  ?- sore throat or hoarseness ?- damage to heart, brain, nerves, lungs, other parts of body or loss of life ? ?Informed patient about role of CRNA in peri- and intra-operative care.  Patient voiced understanding.)  ? ? ? ? ? ?Anesthesia Quick Evaluation ? ?

## 2021-05-15 NOTE — Interval H&P Note (Signed)
History and Physical Interval Note: ? ?05/15/2021 ?3:10 PM ? ?Zachary Gillespie  has presented today for surgery, with the diagnosis of left ureteral stone.  The various methods of treatment have been discussed with the patient and family. After consideration of risks, benefits and other options for treatment, the patient has consented to  Procedure(s): ?CYSTOSCOPY/URETEROSCOPY/HOLMIUM LASER/STENT PLACEMENT (Left) as a surgical intervention.  The patient's history has been reviewed, patient examined, no change in status, stable for surgery.  I have reviewed the patient's chart and labs.  Questions were answered to the patient's satisfaction.   ? ? ?[name] ? ? ?

## 2021-05-15 NOTE — Anesthesia Procedure Notes (Signed)
Procedure Name: Intubation ?Date/Time: 05/15/2021 3:30 PM ?Performed by: Aline Brochure, CRNA ?Pre-anesthesia Checklist: Patient identified, Patient being monitored, Timeout performed, Emergency Drugs available and Suction available ?Patient Re-evaluated:Patient Re-evaluated prior to induction ?Oxygen Delivery Method: Circle system utilized ?Preoxygenation: Pre-oxygenation with 100% oxygen ?Induction Type: IV induction ?Ventilation: Mask ventilation without difficulty ?Laryngoscope Size: McGraph and 4 ?Grade View: Grade I ?Tube type: Oral ?Tube size: 7.5 mm ?Number of attempts: 1 ?Airway Equipment and Method: Stylet and Video-laryngoscopy ?Placement Confirmation: ETT inserted through vocal cords under direct vision, positive ETCO2 and breath sounds checked- equal and bilateral ?Secured at: 22 cm ?Tube secured with: Tape ?Dental Injury: Teeth and Oropharynx as per pre-operative assessment  ? ? ? ? ?

## 2021-05-15 NOTE — Discharge Instructions (Addendum)
AMBULATORY SURGERY  ?DISCHARGE INSTRUCTIONS ? ? ?The drugs that you were given will stay in your system until tomorrow so for the next 24 hours you should not: ? ?Drive an automobile ?Make any legal decisions ?Drink any alcoholic beverage ? ? ?You may resume regular meals tomorrow.  Today it is better to start with liquids and gradually work up to solid foods. ? ?You may eat anything you prefer, but it is better to start with liquids, then soup and crackers, and gradually work up to solid foods. ? ? ?Please notify your doctor immediately if you have any unusual bleeding, trouble breathing, redness and pain at the surgery site, drainage, fever, or pain not relieved by medication ?  ? ? ?Additional Instructions: Resume Meloxicam.  tylenol 1000\[id number]\mg every 6 hours ? ?DISCHARGE INSTRUCTIONS FOR KIDNEY STONE/URETERAL STENT  ? ?MEDICATIONS:  ?1. Resume all your other meds from home.  ?2.  AZO (over-the-counter) can help with the burning/stinging when you urinate. ?3.  Oxycodone is for moderate/severe pain if needed, Rx was sent to your pharmacy. ?4.  Oxybutynin is for stent/bladder irritation.  Rx was sent to pharmacy ? ?ACTIVITY:  ?1. May resume regular activities in 24 hours. ?2. No driving while on narcotic pain medications  ?3. Drink plenty of water  ?4. Continue to walk at home - you can still get blood clots when you are at home, so keep active, but don't over do it.  ?5. May return to work/school tomorrow or when you feel ready  ? ?BATHING:  ?1. You can shower. ?2. You have a string coming from your urethra: The stent string is attached to your ureteral stent. Do not pull on this.  ? ?SIGNS/SYMPTOMS TO CALL:  ?Common postoperative symptoms include urinary frequency, urgency, bladder spasm and blood in the urine ? ?Please call us if you have a fever greater than 101.5, uncontrolled nausea/vomiting, uncontrolled pain, dizziness, unable to urinate, excessively bloody urine, chest pain,  shortness of breath, leg swelling, leg pain, or any other concerns or questions.  ? ?You can reach [place] at [phone].  ? ?FOLLOW-UP:  ?1. You we will be scheduled for a follow-up appointment ?2. You have a string attached to your stent, you may remove it on Thursday afternoon 05/17/2021. To do this, pull the string until the stent is completely removed. You may feel an odd sensation in your back. ? ?

## 2021-05-15 NOTE — H&P (Signed)
? ?Urology H&P ? ?Chief Complaint: Ureteral calculus ? ?History of Present Illness: Zachary Gillespie is a 56 y.o. male initially seen March 2023 with left renal colic secondary to a 5 mm mid ureteral calculus.  His pain subsequently resolved and the stone was not visualized on KUB.  Follow-up CT April 2023 showed persistence of the stone in the left mid ureter.  He presents for ureteroscopic removal.  He is not aware of passing a stone. ? ?Past Medical History:  ?Diagnosis Date  ? Anemia   ? DDD (degenerative disc disease), lumbar   ? Diabetes mellitus without complication ([place])   ? Dyslipidemia   ? GERD (gastroesophageal reflux disease)   ? History of hiatal hernia   ? History of kidney stones   ? H/O  ? HLD (hyperlipidemia)   ? Hypertension   ? Obesity   ? Prediabetes   ? Sciatica of left side   ? Sleep apnea   ? AHI 34  ? Tubular adenoma of colon   ? 2019 (Dr. Benson [place])  ? ? ?Past Surgical History:  ?Procedure Laterality Date  ? AMPUTATION Left 05/16/2018  ? Procedure: Ray Resection Left Index Finger;  Surgeon: [name], MD;  Location: [place] [facility];  Service: Orthopedics;  Laterality: Left;  ? COLONOSCOPY    ? ESOPHAGOGASTRODUODENOSCOPY    ? EXTRACORPOREAL SHOCK WAVE LITHOTRIPSY Left 07/24/2017  ? Procedure: EXTRACORPOREAL SHOCK WAVE LITHOTRIPSY (ESWL);  Surgeon: [name], MD;  Location: [facility];  Service: Urology;  Laterality: Left;  ? EXTRACORPOREAL SHOCK WAVE LITHOTRIPSY Right 12/11/2017  ? Procedure: EXTRACORPOREAL SHOCK WAVE LITHOTRIPSY (ESWL);  Surgeon: [name], MD;  Location: [facility];  Service: Urology;  Laterality: Right;  ? EYE SURGERY Bilateral   ? Lasik  ? INGUINAL HERNIA REPAIR Right 08/08/2017  ? Procedure: LAPAROSCOPIC INGUINAL HERNIA;  Surgeon: [name], MD;  Location: [facility];  Service: General;  Laterality: Right;  ? NERVE REPAIR Left 05/16/2018  ? Procedure: Cutaneous Nerve Repair;  Surgeon: [name], MD;  Location: [place];  Service: Orthopedics;  Laterality: Left;  ?  SKIN FULL THICKNESS GRAFT Left 05/16/2018  ? Procedure: Skin Graft Full Thickness;  Surgeon: [name], MD;  Location: [place];  Service: Orthopedics;  Laterality: Left;  ? TENDON REPAIR Left 05/16/2018  ? Procedure: Extensor Tendon Repair;  Surgeon: [name], MD;  Location: [place];  Service: Orthopedics;  Laterality: Left;  ? TONSILLECTOMY    ? VASECTOMY    ? ? ?Home Medications:  ?Current Meds  ?Medication Sig  ? aspirin EC 81 MG tablet Take 81 mg by mouth at bedtime.   ? atorvastatin (LIPITOR) 40 MG tablet TAKE 1 TABLET DAILY (Patient taking differently: Take 40 mg by mouth at bedtime.)  ? cetirizine (ZYRTEC) 10 MG tablet Take 1 tablet (10 mg total) by mouth daily. (Patient taking differently: Take 10 mg by mouth daily as needed for allergies.)  ? escitalopram (LEXAPRO) 10 MG tablet TAKE 1 TABLET DAILY (Patient taking differently: Take 10 mg by mouth at bedtime.)  ? ferrous sulfate 325 (65 FE) MG tablet Take 325 mg by mouth daily with breakfast.  ? fluticasone (FLONASE) 50 MCG/ACT nasal spray USE 2 SPRAYS IN EACH NOSTRIL DAILY (Patient taking differently: Place 2 sprays into both nostrils daily as needed for allergies.)  ? hydrochlorothiazide (HYDRODIURIL) 25 MG tablet TAKE 1 TABLET DAILY (Patient taking differently: Take 25 mg by mouth at bedtime.)  ? losartan (COZAAR) 100 MG tablet TAKE 1 TABLET DAILY (Patient taking  differently: Take 100 mg by mouth every morning.)  ? Melatonin 10 MG TABS Take 10 mg by mouth at bedtime.   ? meloxicam (MOBIC) 15 MG tablet TAKE 1 TABLET DAILY (Patient taking differently: Take 15 mg by mouth daily.)  ? metFORMIN (GLUCOPHAGE) 500 MG tablet TAKE 2 TABLETS TWICE A DAY WITH MEALS (Patient taking differently: Take 1,000 mg by mouth 2 (two) times daily with a meal.)  ? Multiple Vitamins-Minerals (ONE-A-DAY MENS HEALTH FORMULA PO) Take 1 tablet by mouth daily.   ? Omega-3 Krill Oil 500 MG CAPS Take 500 mg by mouth daily.   ? omeprazole (PRILOSEC) 40 MG capsule Take 1 capsule (40 mg  total) by mouth daily. (Patient taking differently: Take 40 mg by mouth every morning.)  ? ondansetron (ZOFRAN-ODT) 4 MG disintegrating tablet Take 1 tablet (4 mg total) by mouth every 8 (eight) hours as needed for nausea or vomiting.  ? oxyCODONE-acetaminophen (PERCOCET) 10-325 MG tablet Take 1 tablet by mouth every 6 (six) hours as needed for pain.  ? SAXENDA 18 MG/3ML SOPN INJECT 1/2 ML SUBCUTANEOUSLY ONCE A DAY ('3MG'$  TOTAL)  ? vitamin C (ASCORBIC ACID) 500 MG tablet Take 500 mg by mouth daily.  ? ? ?Allergies:  ?Allergies  ?Allergen Reactions  ? Penicillins Anaphylaxis and Other (See Comments)  ?  Has patient had a PCN reaction causing immediate rash, facial/tongue/throat swelling, SOB or lightheadedness with hypotension: Yes ?Has patient had a PCN reaction causing severe rash involving mucus membranes or skin necrosis: No ?Has patient had a PCN reaction that required hospitalization: Yes ?Has patient had a PCN reaction occurring within the last 10 years: No ?If all of the above answers are "NO", then may proceed with Cephalosporin use. ?  ? Sulfa Antibiotics Swelling and Other (See Comments)  ?  Childhood allergy- site of swelling not recalled  ? Keflex [Cephalexin] Other (See Comments)  ?  Childhood allergy- reaction not recalled  ? ? ?Family History  ?Problem Relation Age of Onset  ? Cancer Mother   ?     mass on liver  ? Heart disease Father   ? Diabetes Father   ? Hyperlipidemia Father   ? Diabetes Sister   ? Heart disease Paternal Grandfather   ? Bladder Cancer Neg Hx   ? Kidney cancer Neg Hx   ? Prostate cancer Neg Hx   ? ? ?Social History:  reports that he has never smoked. He has never used smokeless tobacco. He reports that he does not currently use alcohol. He reports that he does not use drugs. ? ?ROS: ?A complete review of systems was performed.  All systems are negative except for pertinent findings as noted. ? ?Physical Exam:  ?Vital signs in last 24 hours: ?  ?Constitutional:  Alert and oriented,  No acute distress ?HEENT: [place] AT, moist mucus membranes.  Trachea midline, no masses ?Cardiovascular: Regular rate and rhythm, ?Respiratory: Normal respiratory effort, lungs clear bilaterally ?Psychiatric: Normal mood and affect ? ? ?Laboratory Data:  ?No results for input(s): WBC, HGB, HCT in the last 72 hours. ?No results for input(s): NA, K, CL, CO2, GLUCOSE, BUN, CREATININE, CALCIUM in the last 72 hours. ?No results for input(s): LABPT, INR in the last 72 hours. ?No results for input(s): LABURIN in the last 72 hours. ?Results for orders placed or performed in visit on 05/04/21  ?CULTURE, URINE COMPREHENSIVE     Status: Abnormal  ? Collection Time: 05/04/21 10:40 AM  ? Specimen: Urine  ? UR  ?Result  Value Ref Range Status  ? Urine Culture, Comprehensive Final report (A)  Final  ? Organism ID, Bacteria Comment (A)  Final  ?  Comment: Beta hemolytic Streptococcus, group B ?100 Colonies/mL                                                      . ?Penicillin and ampicillin are drugs of choice for treatment of ?beta-hemolytic streptococcal infections. Susceptibility testing of ?penicillins and other beta-lactam agents approved by the FDA for ?treatment of beta-hemolytic streptococcal infections need not be ?performed routinely because nonsusceptible isolates are extremely ?rare in any beta-hemolytic streptococcus and have not been reported ?for Streptococcus pyogenes (group A). (CLSI) ?  ?Microscopic Examination     Status: Abnormal  ? Collection Time: 05/04/21 10:40 AM  ? Urine  ?Result Value Ref Range Status  ? WBC, UA 0-5 0 - 5 /hpf Final  ? RBC 3-10 (A) 0 - 2 /hpf Final  ? Epithelial Cells (non renal) 0-10 0 - 10 /hpf Final  ? Bacteria, UA None seen None seen/Few Final  ? ? ?Impression/Plan: ? ?1.  Left mid ureteral calculus ?Presents for ureteroscopic stone removal. ?The procedure was discussed in detail including potential risks of bleeding, infection/sepsis and ureteral injury.  The need for a postoperative  ureteral stent was discussed and the possibility of stent pain/symptoms.  In a small percentage of cases we discussed the stone is not able to be removed due to inability to access the ureter with the ureter

## 2021-05-15 NOTE — Transfer of Care (Signed)
Immediate Anesthesia Transfer of Care Note ? ?Patient: Zachary Gillespie ? ?Procedure(s) Performed: CYSTOSCOPY/URETEROSCOPY/HOLMIUM LASER/STENT PLACEMENT (Left) ? ?Patient Location: PACU ? ?Anesthesia Type:General ? ?Level of Consciousness: awake, alert  and oriented ? ?Airway & Oxygen Therapy: Patient Spontanous Breathing ? ?Post-op Assessment: Report given to RN and Post -op Vital signs reviewed and stable ? ?Post vital signs: Reviewed and stable ? ?Last Vitals:  ?Vitals Value Taken Time  ?BP 112/76 05/15/21 1615  ?Temp 36.7 ?C 05/15/21 1615  ?Pulse 80 05/15/21 1621  ?Resp 20 05/15/21 1621  ?SpO2 92 % 05/15/21 1621  ?Vitals shown include unvalidated device data. ? ?Last Pain:  ?Vitals:  ? 05/15/21 1027  ?TempSrc: Temporal  ?PainSc: 4   ?   ? ?  ? ?Complications: No notable events documented. ?

## 2021-05-15 NOTE — Op Note (Signed)
Preoperative diagnosis:  ?Left mid ureteral calculus ? ?Postoperative diagnosis:  ?Left distal ureteral calculus ? ?Procedure: ? ?Cystoscopy ?Left ureteroscopy and stone removal ?Ureteroscopic laser lithotripsy ?Left ureteral stent placement (89F/26 cm)  ?Left retrograde pyelography with interpretation ?Intraoperative fluoroscopy (< 30 min) ? ?Surgeon: Ronda Fairly. Joakim Huesman, M.D. ? ?Anesthesia: General ? ?Complications: None ? ?Intraoperative findings:  ?Cystoscopy-urethra normal in caliber without stricture.  Prostate nonocclusive with moderate bladder neck elevation.  Bladder mucosa normal in appearance without erythema, solid or papillary lesions.  UOs normal-appearing bilaterally ?Ureteroscopy-nonimpacted calculus in the upper portion of the distal ureter ?Left retrograde pyelography post procedure showed no filling defects, stone fragments or contrast extravasation ? ?EBL: Minimal ? ?Specimens: ?Calculus fragments for analysis ? ? ?Indication: Zachary Gillespie is a 56 y.o. diagnosed with a 5 mm left mid ureteral calculus mid March 2023.  The calculus was not visualized on KUB.  He initially elected MET and was unable to pass the stone.  Follow-up imaging showed persistence of the calculus in the mid ureter.  After reviewing the management options for treatment, the patient elected to proceed with the above surgical procedure(s). We have discussed the potential benefits and risks of the procedure, side effects of the proposed treatment, the likelihood of the patient achieving the goals of the procedure, and any potential problems that might occur during the procedure or recuperation. Informed consent has been obtained. ? ?Description of procedure: ? ?The patient was taken to the operating room and general anesthesia was induced.  The patient was placed in the dorsal lithotomy position, prepped and draped in the usual sterile fashion, and preoperative antibiotics were administered. A preoperative time-out was  performed.  ? ?A 21 French cystoscope was lubricated, passed per urethra and advanced proximally under direct vision with findings as described above. ? ?Attention was directed to the left ureteral orifice and a 0.038 Sensor wire was advanced up the ureter into the renal pelvis under fluoroscopic guidance. ? ?A 4.5 Fr semirigid ureteroscope was then advanced into the ureter next to the guidewire and the calculus was identified. ? ?The stone was then fragmented with a 242 micron holmium laser fiber on a setting of 0.3J/40 Hz ? ?All fragments were then removed from the ureter with a zero tip nitinol basket.  Reinspection of the ureter revealed no remaining visible stones or fragments.  ? ?Retrograde pyelogram was performed with findings as described above. ? ?A 89F/26 centimeters Contour ureteral stent was placed under fluoroscopic guidance.  The wire was then removed with an adequate stent curl noted in the renal pelvis as well as in the bladder. ? ?The bladder was then emptied and the procedure ended.  The patient appeared to tolerate the procedure well and without complications.  After anesthetic reversal the patient was transported to the PACU in stable condition.  ? ? ?Plan: ?The stent was left attached to a tether and the patient was instructed to remove in the afternoon Thursday, 05/17/2021 ?Postop follow-up will be scheduled in ~ 1 month ? ? ?John Giovanni, MD ? ?

## 2021-05-16 ENCOUNTER — Encounter: Payer: Self-pay | Admitting: Urology

## 2021-05-16 ENCOUNTER — Other Ambulatory Visit: Payer: Self-pay | Admitting: Family Medicine

## 2021-05-16 NOTE — Anesthesia Postprocedure Evaluation (Signed)
Anesthesia Post Note ? ?Patient: SAED HUDLOW ? ?Procedure(s) Performed: CYSTOSCOPY/URETEROSCOPY/HOLMIUM LASER/STENT PLACEMENT (Left) ? ?Patient location during evaluation: PACU ?Anesthesia Type: General ?Level of consciousness: awake and alert, oriented and patient cooperative ?Pain management: pain level controlled ?Vital Signs Assessment: post-procedure vital signs reviewed and stable ?Respiratory status: spontaneous breathing, nonlabored ventilation and respiratory function stable ?Cardiovascular status: blood pressure returned to baseline and stable ?Postop Assessment: adequate PO intake ?Anesthetic complications: no ? ? ?No notable events documented. ? ? ?Last Vitals:  ?Vitals:  ? 05/15/21 1630 05/15/21 1645  ?BP: 105/73 117/83  ?Pulse: 77 77  ?Resp: 16 12  ?Temp:  36.7 ?C  ?SpO2: 94% 97%  ?  ?Last Pain:  ?Vitals:  ? 05/15/21 1645  ?TempSrc:   ?PainSc: 3   ? ? ?  ?  ?  ?  ?  ?  ? ?Darrin Nipper ? ? ? ? ?

## 2021-05-16 NOTE — Telephone Encounter (Signed)
Requested Prescriptions  ?Pending Prescriptions Disp Refills  ?? BD PEN NEEDLE MICRO U/F 32G X 6 MM MISC [Pharmacy Med Name: BD PEN NED UF MICRO 6MM 100'S 32G 1/4] 100 each 3  ?  Sig: USE TO INJECT SAXENDA UNDER THE SKIN DAILY AS DIRECTED  ?  ? Endocrinology: Diabetes - Testing Supplies Passed - 05/16/2021  9:44 AM  ?  ?  Passed - Valid encounter within last 12 months  ?  Recent Outpatient Visits   ?      ? 1 month ago Insomnia, unspecified type  ? [facility] Pickard, Cammie Mcgee, MD  ? 3 months ago Strain of lumbar region, initial encounter  ? [facility] Pickard, Cammie Mcgee, MD  ? 7 months ago Iron deficiency anemia, unspecified iron deficiency anemia type  ? [facility] Pickard, Cammie Mcgee, MD  ? 9 months ago Controlled type 2 diabetes mellitus with complication, without long-term current use of insulin ([place])  ? [facility] Pickard, Cammie Mcgee, MD  ? 1 year ago Iron deficiency anemia, unspecified iron deficiency anemia type  ? [facility] Pickard, Cammie Mcgee, MD  ?  ?  ?Future Appointments   ?        ? In 1 week Stoioff, Ronda Fairly, MD [place]  ?  ? ?  ?  ?  ?? meloxicam (MOBIC) 15 MG tablet [Pharmacy Med Name: MELOXICAM TABS $RemoveBefore'15MG'UDmoNlgVEccMn$ ] 30 tablet 11  ?  Sig: TAKE 1 TABLET DAILY  ?  ? Analgesics:  COX2 Inhibitors Failed - 05/16/2021  9:44 AM  ?  ?  Failed - Manual Review: Labs are only required if the patient has taken medication for more than 8 weeks.  ?  ?  Passed - HGB in normal range and within 360 days  ?  Hemoglobin  ?Date Value Ref Range Status  ?03/20/2021 13.6 13.0 - 17.0 g/dL Final  ?   ?  ?  Passed - Cr in normal range and within 360 days  ?  Creat  ?Date Value Ref Range Status  ?08/03/2020 0.73 0.70 - 1.30 mg/dL Final  ? ?Creatinine, Ser  ?Date Value Ref Range Status  ?03/20/2021 1.03 0.61 - 1.24 mg/dL Final  ?   ?  ?  Passed - HCT in normal range and within 360 days  ?  HCT  ?Date Value Ref Range Status   ?03/20/2021 42.7 39.0 - 52.0 % Final  ?   ?  ?  Passed - AST in normal range and within 360 days  ?  AST  ?Date Value Ref Range Status  ?08/03/2020 21 10 - 35 U/L Final  ?   ?  ?  Passed - ALT in normal range and within 360 days  ?  ALT  ?Date Value Ref Range Status  ?08/03/2020 38 9 - 46 U/L Final  ?   ?  ?  Passed - eGFR is 30 or above and within 360 days  ?  GFR, Est African American  ?Date Value Ref Range Status  ?02/10/2020 114 > OR = 60 mL/min/1.25m2 Final  ? ?GFR, Est Non African American  ?Date Value Ref Range Status  ?02/10/2020 99 > OR = 60 mL/min/1.15m2 Final  ? ?GFR, Estimated  ?Date Value Ref Range Status  ?03/20/2021 >60 >60 mL/min Final  ?  Comment:  ?  (NOTE) ?Calculated using the CKD-EPI Creatinine Equation (2021) ?  ? ?eGFR  ?Date Value Ref  Range Status  ?08/03/2020 109 > OR = 60 mL/min/1.56m2 Final  ?  Comment:  ?  The eGFR is based on the CKD-EPI 2021 equation. To calculate  ?the new eGFR from a previous Creatinine or Cystatin C ?result, go to https://www.kidney.org/professionals/ ?kdoqi/gfr%5Fcalculator ?  ?   ?  ?  Passed - Patient is not pregnant  ?  ?  Passed - Valid encounter within last 12 months  ?  Recent Outpatient Visits   ?      ? 1 month ago Insomnia, unspecified type  ? [facility] Pickard, Cammie Mcgee, MD  ? 3 months ago Strain of lumbar region, initial encounter  ? [facility] Pickard, Cammie Mcgee, MD  ? 7 months ago Iron deficiency anemia, unspecified iron deficiency anemia type  ? [facility] Pickard, Cammie Mcgee, MD  ? 9 months ago Controlled type 2 diabetes mellitus with complication, without long-term current use of insulin ([place])  ? [facility] Pickard, Cammie Mcgee, MD  ? 1 year ago Iron deficiency anemia, unspecified iron deficiency anemia type  ? [facility] Pickard, Cammie Mcgee, MD  ?  ?  ?Future Appointments   ?        ? In 1 week Stoioff, Ronda Fairly, MD [place]  ?  ? ?  ?  ?  ? ?

## 2021-05-21 LAB — CALCULI, WITH PHOTOGRAPH (CLINICAL LAB)
Calcium Oxalate Dihydrate: 40 %
Calcium Oxalate Monohydrate: 60 %
Weight Calculi: 22 mg

## 2021-05-24 ENCOUNTER — Ambulatory Visit: Admitting: Urology

## 2021-05-25 ENCOUNTER — Other Ambulatory Visit: Payer: Self-pay | Admitting: Family Medicine

## 2021-05-25 NOTE — Telephone Encounter (Signed)
Requested medication (s) are due for refill today: expired medication  Requested medication (s) are on the active medication list: yes  Last refill:  04/10/20 #48g 3 refills  Future visit scheduled: no  Notes to clinic:  expired medication do you want to renew Rx?     Requested Prescriptions  Pending Prescriptions Disp Refills   fluticasone (FLONASE) 50 MCG/ACT nasal spray [Pharmacy Med Name: FLUTICASONE PROP NASAL SPRAY 16GM 50MCG] 48 g 3    Sig: USE 2 SPRAYS IN EACH NOSTRIL DAILY     Ear, Nose, and Throat: Nasal Preparations - Corticosteroids Passed - 05/25/2021  3:19 AM      Passed - Valid encounter within last 12 months    Recent Outpatient Visits           1 month ago Insomnia, unspecified type   [place] Pickard, Cammie Mcgee, MD   4 months ago Strain of lumbar region, initial encounter   [place] [name], MD   7 months ago Iron deficiency anemia, unspecified iron deficiency anemia type   [place] [name], MD   9 months ago Controlled type 2 diabetes mellitus with complication, without long-term current use of insulin ([place])   [place] [name], MD   1 year ago Iron deficiency anemia, unspecified iron deficiency anemia type   [place] Pickard, Cammie Mcgee, MD       Future Appointments             In 3 weeks Stoioff, Ronda Fairly, MD [place]

## 2021-06-20 ENCOUNTER — Encounter: Payer: Self-pay | Admitting: Urology

## 2021-06-20 ENCOUNTER — Ambulatory Visit (INDEPENDENT_AMBULATORY_CARE_PROVIDER_SITE_OTHER): Admitting: Urology

## 2021-06-20 VITALS — BP 130/83 | HR 82 | Ht 69.0 in | Wt 280.0 lb

## 2021-06-20 DIAGNOSIS — N200 Calculus of kidney: Secondary | ICD-10-CM | POA: Diagnosis not present

## 2021-06-20 NOTE — Addendum Note (Signed)
Addended by: Kyra Manges on: 06/20/2021 01:38 PM   Modules accepted: Orders

## 2021-06-20 NOTE — Progress Notes (Signed)
06/20/2021 1:12 PM   Zachary Gillespie Nov 01, 1965 [id number]  Referring provider: [name], MD [address] [place],  [place] [address]  Chief Complaint  Patient presents with   Routine Post Op    Urologic history: 1.  Recurrent nephrolithiasis SWL 2019 Ureteroscopic removal 5 mm left distal ureteral calculus 05/15/2021 CT 03/2021 with bilateral, nonobstructing renal calculi (1-3 mm)   HPI: 56 y.o. male presents for postop follow-up.  Ureteroscopic removal of a left distal ureteral calculus 05/15/2021 Postop stent left on tether which was removed on postop day #2 Bothersome stent symptoms but no problems after removal Stone analysis CaOxMono/CaOxDi 60/40   PMH: Past Medical History:  Diagnosis Date   Anemia    DDD (degenerative disc disease), lumbar    Diabetes mellitus without complication (HCC)    Dyslipidemia    GERD (gastroesophageal reflux disease)    History of hiatal hernia    History of kidney stones    H/O   HLD (hyperlipidemia)    Hypertension    Obesity    Prediabetes    Sciatica of left side    Sleep apnea    AHI 34   Tubular adenoma of colon    2019 (Dr. Benson [place])    Surgical History: Past Surgical History:  Procedure Laterality Date   AMPUTATION Left 05/16/2018   Procedure: Ray Resection Left Index Finger;  Surgeon: [name], MD;  Location: [place];  Service: Orthopedics;  Laterality: Left;   COLONOSCOPY     CYSTOSCOPY/URETEROSCOPY/HOLMIUM LASER/STENT PLACEMENT Left 05/15/2021   Procedure: CYSTOSCOPY/URETEROSCOPY/HOLMIUM LASER/STENT PLACEMENT;  Surgeon: [name], MD;  Location: [facility];  Service: Urology;  Laterality: Left;   ESOPHAGOGASTRODUODENOSCOPY     EXTRACORPOREAL SHOCK WAVE LITHOTRIPSY Left 07/24/2017   Procedure: EXTRACORPOREAL SHOCK WAVE LITHOTRIPSY (ESWL);  Surgeon: [name], MD;  Location: [facility];  Service: Urology;  Laterality: Left;   EXTRACORPOREAL SHOCK WAVE LITHOTRIPSY Right 12/11/2017    Procedure: EXTRACORPOREAL SHOCK WAVE LITHOTRIPSY (ESWL);  Surgeon: [name], MD;  Location: [facility];  Service: Urology;  Laterality: Right;   EYE SURGERY Bilateral    Lasik   INGUINAL HERNIA REPAIR Right 08/08/2017   Procedure: LAPAROSCOPIC INGUINAL HERNIA;  Surgeon: [name], MD;  Location: [facility];  Service: General;  Laterality: Right;   NERVE REPAIR Left 05/16/2018   Procedure: Cutaneous Nerve Repair;  Surgeon: [name], MD;  Location: [place];  Service: Orthopedics;  Laterality: Left;   SKIN FULL THICKNESS GRAFT Left 05/16/2018   Procedure: Skin Graft Full Thickness;  Surgeon: [name], MD;  Location: [place];  Service: Orthopedics;  Laterality: Left;   TENDON REPAIR Left 05/16/2018   Procedure: Extensor Tendon Repair;  Surgeon: [name], MD;  Location: [place];  Service: Orthopedics;  Laterality: Left;   TONSILLECTOMY     VASECTOMY      Home Medications:  Allergies as of 06/20/2021       Reactions   Penicillins Anaphylaxis, Other (See Comments)   Has patient had a PCN reaction causing immediate rash, facial/tongue/throat swelling, SOB or lightheadedness with hypotension: Yes Has patient had a PCN reaction causing severe rash involving mucus membranes or skin necrosis: No Has patient had a PCN reaction that required hospitalization: Yes Has patient had a PCN reaction occurring within the last 10 years: No If all of the above answers are "NO", then may proceed with Cephalosporin use.   Sulfa Antibiotics Swelling, Other (See Comments)   Childhood allergy- site of swelling  not recalled   Keflex [cephalexin] Other (See Comments)   Childhood allergy- reaction not recalled        Medication List        Accurate as of June 20, 2021  1:12 PM. If you have any questions, ask your nurse or doctor.          STOP taking these medications    ferrous sulfate 325 (65 FE) MG tablet Stopped by: [name], MD   ondansetron 4 MG disintegrating  tablet Commonly known as: ZOFRAN-ODT Stopped by: [name], MD   oxybutynin 5 MG tablet Commonly known as: DITROPAN Stopped by: [name], MD   oxyCODONE-acetaminophen 10-325 MG tablet Commonly known as: Percocet Stopped by: [name], MD   vitamin C 500 MG tablet Commonly known as: ASCORBIC ACID Stopped by: [name], MD       TAKE these medications    aspirin EC 81 MG tablet Take 81 mg by mouth at bedtime.   atorvastatin 40 MG tablet Commonly known as: LIPITOR TAKE 1 TABLET DAILY What changed: when to take this   BD Pen Needle Micro U/F 32G X 6 MM Misc Generic drug: Insulin Pen Needle USE TO INJECT SAXENDA UNDER THE SKIN DAILY AS DIRECTED   cetirizine 10 MG tablet Commonly known as: ZYRTEC Take 1 tablet (10 mg total) by mouth daily. What changed:  when to take this reasons to take this   escitalopram 10 MG tablet Commonly known as: LEXAPRO TAKE 1 TABLET DAILY What changed: when to take this   fluticasone 50 MCG/ACT nasal spray Commonly known as: FLONASE USE 2 SPRAYS IN EACH NOSTRIL DAILY   hydrochlorothiazide 25 MG tablet Commonly known as: HYDRODIURIL TAKE 1 TABLET DAILY What changed: when to take this   losartan 100 MG tablet Commonly known as: COZAAR TAKE 1 TABLET DAILY What changed: when to take this   Melatonin 10 MG Tabs Take 10 mg by mouth at bedtime.   meloxicam 15 MG tablet Commonly known as: MOBIC TAKE 1 TABLET DAILY   metFORMIN 500 MG tablet Commonly known as: GLUCOPHAGE TAKE 2 TABLETS TWICE A DAY WITH MEALS What changed: See the new instructions.   Omega-3 Krill Oil 500 MG Caps Take 500 mg by mouth daily.   omeprazole 40 MG capsule Commonly known as: PRILOSEC Take 1 capsule (40 mg total) by mouth daily. What changed: when to take this   ONE-A-DAY MENS HEALTH FORMULA PO Take 1 tablet by mouth daily.   Saxenda 18 MG/3ML Sopn Generic drug: Liraglutide -Weight Management INJECT 1/2 ML SUBCUTANEOUSLY  ONCE A DAY ('3MG'$  TOTAL)        Allergies:  Allergies  Allergen Reactions   Penicillins Anaphylaxis and Other (See Comments)    Has patient had a PCN reaction causing immediate rash, facial/tongue/throat swelling, SOB or lightheadedness with hypotension: Yes Has patient had a PCN reaction causing severe rash involving mucus membranes or skin necrosis: No Has patient had a PCN reaction that required hospitalization: Yes Has patient had a PCN reaction occurring within the last 10 years: No If all of the above answers are "NO", then may proceed with Cephalosporin use.    Sulfa Antibiotics Swelling and Other (See Comments)    Childhood allergy- site of swelling not recalled   Keflex [Cephalexin] Other (See Comments)    Childhood allergy- reaction not recalled    Family History: Family History  Problem Relation Age of Onset   Cancer Mother  mass on liver   Heart disease Father    Diabetes Father    Hyperlipidemia Father    Diabetes Sister    Heart disease Paternal Grandfather    Bladder Cancer Neg Hx    Kidney cancer Neg Hx    Prostate cancer Neg Hx     Social History:  reports that he has never smoked. He has never used smokeless tobacco. He reports that he does not currently use alcohol. He reports that he does not use drugs.   Physical Exam: BP 130/83   Pulse 82   Ht '5\' 9"'$  (1.753 m)   Wt 280 lb (127 kg)   BMI 41.35 kg/m   Constitutional:  Alert and oriented, No acute distress. HEENT: [place] AT Respiratory: Normal respiratory effort, no increased work of breathing. Psychiatric: Normal mood and affect.   Assessment & Plan:    1.  Recurrent nephrolithiasis Doing well status post ureteroscopic stone removal Bilateral nonobstructing renal calculi Recommend metabolic evaluation through [facility] and he would like to proceed.  Will call with results Follow-up 1 year with [place], MD  [place] [address] [place], [place] [address] [phone]

## 2021-06-20 NOTE — Patient Instructions (Signed)

## 2021-06-25 ENCOUNTER — Other Ambulatory Visit: Payer: Self-pay | Admitting: Family Medicine

## 2021-06-25 NOTE — Telephone Encounter (Signed)
Requested Prescriptions  Pending Prescriptions Disp Refills  . SAXENDA 18 MG/3ML SOPN [Pharmacy Med Name: SAXENDA 18 MG/3 ML PEN] 15 mL 0    Sig: INJECT 1/2 ML SUBCUTANEOUSLY ONCE A DAY ('3MG'$  TOTAL)     Endocrinology:  Diabetes - GLP-1 Receptor Agonists Failed - 06/25/2021  3:35 PM      Failed - HBA1C is between 0 and 7.9 and within 180 days    Hgb A1c MFr Bld  Date Value Ref Range Status  08/03/2020 6.7 (H) <5.7 % of total Hgb Final    Comment:    For someone without known diabetes, a hemoglobin A1c value of 6.5% or greater indicates that they may have  diabetes and this should be confirmed with a follow-up  test. . For someone with known diabetes, a value <7% indicates  that their diabetes is well controlled and a value  greater than or equal to 7% indicates suboptimal  control. A1c targets should be individualized based on  duration of diabetes, age, comorbid conditions, and  other considerations. . Currently, no consensus exists regarding use of hemoglobin A1c for diagnosis of diabetes for children. Renella Cunas - Valid encounter within last 6 months    Recent Outpatient Visits          2 months ago Insomnia, unspecified type   [place] Pickard, Cammie Mcgee, MD   5 months ago Strain of lumbar region, initial encounter   [place] Pickard, Cammie Mcgee, MD   8 months ago Iron deficiency anemia, unspecified iron deficiency anemia type   [place] [name], MD   10 months ago Controlled type 2 diabetes mellitus with complication, without long-term current use of insulin ([place])   [place] [name], MD   1 year ago Iron deficiency anemia, unspecified iron deficiency anemia type   [place] Pickard, Cammie Mcgee, MD      Future Appointments            In 1 year Stoioff, Ronda Fairly, MD [facility]

## 2021-07-25 ENCOUNTER — Other Ambulatory Visit: Payer: Self-pay | Admitting: Family Medicine

## 2021-07-26 NOTE — Telephone Encounter (Signed)
Requested medication (s) are due for refill today: Yes  Requested medication (s) are on the active medication list: Yes  Last refill:  06/25/21  Future visit scheduled: No  Notes to clinic:  Last refill states pt. Needs appointment with lab work. Please advise.    Requested Prescriptions  Pending Prescriptions Disp Refills   SAXENDA 18 MG/3ML SOPN [Pharmacy Med Name: SAXENDA 18 MG/3 ML PEN]      Sig: INJECT '3MG'$  [place]     Endocrinology:  Diabetes - GLP-1 Receptor Agonists Failed - 07/25/2021  3:23 AM      Failed - HBA1C is between 0 and 7.9 and within 180 days    Hgb A1c MFr Bld  Date Value Ref Range Status  08/03/2020 6.7 (H) <5.7 % of total Hgb Final    Comment:    For someone without known diabetes, a hemoglobin A1c value of 6.5% or greater indicates that they may have  diabetes and this should be confirmed with a follow-up  test. . For someone with known diabetes, a value <7% indicates  that their diabetes is well controlled and a value  greater than or equal to 7% indicates suboptimal  control. A1c targets should be individualized based on  duration of diabetes, age, comorbid conditions, and  other considerations. . Currently, no consensus exists regarding use of hemoglobin A1c for diagnosis of diabetes for children. Renella Cunas - Valid encounter within last 6 months    Recent Outpatient Visits           3 months ago Insomnia, unspecified type   [place] Pickard, Cammie Mcgee, MD   6 months ago Strain of lumbar region, initial encounter   [place] Pickard, Cammie Mcgee, MD   9 months ago Iron deficiency anemia, unspecified iron deficiency anemia type   [place] [name], MD   11 months ago Controlled type 2 diabetes mellitus with complication, without long-term current use of insulin ([place])   [place] [name], MD   1 year ago Iron deficiency anemia, unspecified  iron deficiency anemia type   [place] Pickard, Cammie Mcgee, MD       Future Appointments             In 11 months [place], Ronda Fairly, MD [facility]

## 2021-07-30 ENCOUNTER — Encounter: Payer: Self-pay | Admitting: Family Medicine

## 2021-08-16 ENCOUNTER — Other Ambulatory Visit: Payer: Self-pay | Admitting: Family Medicine

## 2021-08-16 NOTE — Telephone Encounter (Signed)
Requested medication (s) are due for refill today: yes  Requested medication (s) are on the active medication list: yes  Last refill:  06/25/21 #15 ml 0 refills  Future visit scheduled: no  Notes to clinic:  please verify how much to dispense and how many refills to give?     Requested Prescriptions  Pending Prescriptions Disp Refills   SAXENDA 18 MG/3ML SOPN [Pharmacy Med Name: SAXENDA 18 MG/3 ML PEN]      Sig: INJECT '3MG'$  [place]     Endocrinology:  Diabetes - GLP-1 Receptor Agonists Failed - 08/16/2021 12:12 PM      Failed - HBA1C is between 0 and 7.9 and within 180 days    Hgb A1c MFr Bld  Date Value Ref Range Status  08/03/2020 6.7 (H) <5.7 % of total Hgb Final    Comment:    For someone without known diabetes, a hemoglobin A1c value of 6.5% or greater indicates that they may have  diabetes and this should be confirmed with a follow-up  test. . For someone with known diabetes, a value <7% indicates  that their diabetes is well controlled and a value  greater than or equal to 7% indicates suboptimal  control. A1c targets should be individualized based on  duration of diabetes, age, comorbid conditions, and  other considerations. . Currently, no consensus exists regarding use of hemoglobin A1c for diagnosis of diabetes for children. Renella Cunas - Valid encounter within last 6 months    Recent Outpatient Visits           4 months ago Insomnia, unspecified type   [place] Pickard, Cammie Mcgee, MD   6 months ago Strain of lumbar region, initial encounter   [place] Pickard, Cammie Mcgee, MD   10 months ago Iron deficiency anemia, unspecified iron deficiency anemia type   [place] [name], MD   1 year ago Controlled type 2 diabetes mellitus with complication, without long-term current use of insulin ([place])   [place] [name], MD   1 year ago Iron deficiency anemia,  unspecified iron deficiency anemia type   [place] Pickard, Cammie Mcgee, MD       Future Appointments             In 10 months [place], Ronda Fairly, MD [facility]

## 2021-08-16 NOTE — Telephone Encounter (Signed)
Pls call pt for an appt for med evaluation/refill

## 2021-09-13 ENCOUNTER — Other Ambulatory Visit: Payer: Self-pay | Admitting: *Deleted

## 2021-09-13 DIAGNOSIS — N200 Calculus of kidney: Secondary | ICD-10-CM

## 2021-09-17 NOTE — Telephone Encounter (Signed)
Spoke with patient; called to schedule appt for med eval and refills. Patient stated he gets all of his meds filled at the [place].   Also offered to schedule annual cpe; patient stated he had one at the [place] recently and sent all of his medical records to this office.  Patient stated nothing further needed at this time and will call if something comes up.

## 2021-10-16 ENCOUNTER — Encounter: Payer: Self-pay | Admitting: Family Medicine

## 2021-11-29 ENCOUNTER — Encounter: Payer: Self-pay | Admitting: Family Medicine

## 2021-12-03 ENCOUNTER — Other Ambulatory Visit: Payer: Self-pay

## 2021-12-03 DIAGNOSIS — E118 Type 2 diabetes mellitus with unspecified complications: Secondary | ICD-10-CM

## 2021-12-05 ENCOUNTER — Other Ambulatory Visit

## 2021-12-05 DIAGNOSIS — E118 Type 2 diabetes mellitus with unspecified complications: Secondary | ICD-10-CM

## 2021-12-06 ENCOUNTER — Encounter: Payer: Self-pay | Admitting: Family Medicine

## 2021-12-06 LAB — HEMOGLOBIN A1C
Hgb A1c MFr Bld: 10.5 % of total Hgb — ABNORMAL HIGH (ref <5.7)
Mean Plasma Glucose: 255 mg/dL
eAG (mmol/L): 14.1 mmol/L

## 2021-12-13 ENCOUNTER — Ambulatory Visit (INDEPENDENT_AMBULATORY_CARE_PROVIDER_SITE_OTHER): Admitting: Family Medicine

## 2021-12-13 VITALS — BP 132/72 | HR 78 | Ht 69.0 in | Wt 313.0 lb

## 2021-12-13 DIAGNOSIS — E1165 Type 2 diabetes mellitus with hyperglycemia: Secondary | ICD-10-CM | POA: Diagnosis not present

## 2021-12-13 MED ORDER — BLOOD GLUCOSE MONITOR KIT: PACK | 0 refills | Status: DC

## 2021-12-13 MED ORDER — TIRZEPATIDE 7.5 MG/0.5ML ~~LOC~~ SOAJ: 7.5000 mg | SUBCUTANEOUS | 2 refills | Status: DC

## 2021-12-13 NOTE — Progress Notes (Signed)
Subjective:    Patient ID: Zachary Gillespie, male    DOB: 1965-11-13, 56 y.o.   MRN: [id number] Since I last saw the patient he discontinued Saxenda.  He is still taking metformin.  Recently he developed some polyuria.  He is also had some paresthesias in his hands and his feet.  This prompted him to check his hemoglobin A1c which was found to be 10.5.  He is here today to discuss treatment options Past Medical History:  Diagnosis Date   Anemia    DDD (degenerative disc disease), lumbar    Diabetes mellitus without complication (HCC)    Dyslipidemia    GERD (gastroesophageal reflux disease)    History of hiatal hernia    History of kidney stones    H/O   HLD (hyperlipidemia)    Hypertension    Obesity    Prediabetes    Sciatica of left side    Sleep apnea    AHI 34   Tubular adenoma of colon    2019 (Dr. Benson Norway)   Past Surgical History:  Procedure Laterality Date   AMPUTATION Left 05/16/2018   Procedure: Ray Resection Left Index Finger;  Surgeon: [name], MD;  Location: [facility];  Service: Orthopedics;  Laterality: Left;   COLONOSCOPY     CYSTOSCOPY/URETEROSCOPY/HOLMIUM LASER/STENT PLACEMENT Left 05/15/2021   Procedure: CYSTOSCOPY/URETEROSCOPY/HOLMIUM LASER/STENT PLACEMENT;  Surgeon: [name], MD;  Location: [facility];  Service: Urology;  Laterality: Left;   ESOPHAGOGASTRODUODENOSCOPY     EXTRACORPOREAL SHOCK WAVE LITHOTRIPSY Left 07/24/2017   Procedure: EXTRACORPOREAL SHOCK WAVE LITHOTRIPSY (ESWL);  Surgeon: [name], MD;  Location: [facility];  Service: Urology;  Laterality: Left;   EXTRACORPOREAL SHOCK WAVE LITHOTRIPSY Right 12/11/2017   Procedure: EXTRACORPOREAL SHOCK WAVE LITHOTRIPSY (ESWL);  Surgeon: [name], MD;  Location: [facility];  Service: Urology;  Laterality: Right;   EYE SURGERY Bilateral    Lasik   INGUINAL HERNIA REPAIR Right 08/08/2017   Procedure: LAPAROSCOPIC INGUINAL HERNIA;  Surgeon: [name], MD;  Location: [facility];  Service:  General;  Laterality: Right;   NERVE REPAIR Left 05/16/2018   Procedure: Cutaneous Nerve Repair;  Surgeon: [name], MD;  Location: [place];  Service: Orthopedics;  Laterality: Left;   SKIN FULL THICKNESS GRAFT Left 05/16/2018   Procedure: Skin Graft Full Thickness;  Surgeon: [name], MD;  Location: [place];  Service: Orthopedics;  Laterality: Left;   TENDON REPAIR Left 05/16/2018   Procedure: Extensor Tendon Repair;  Surgeon: [name], MD;  Location: [place];  Service: Orthopedics;  Laterality: Left;   TONSILLECTOMY     VASECTOMY     Current Outpatient Medications on File Prior to Visit  Medication Sig Dispense Refill   aspirin EC 81 MG tablet Take 81 mg by mouth at bedtime.      atorvastatin (LIPITOR) 40 MG tablet TAKE 1 TABLET DAILY (Patient taking differently: Take 40 mg by mouth at bedtime.) 90 tablet 3   BD PEN NEEDLE MICRO U/F 32G X 6 MM MISC USE TO INJECT SAXENDA UNDER THE SKIN DAILY AS DIRECTED 100 each 3   cetirizine (ZYRTEC) 10 MG tablet Take 1 tablet (10 mg total) by mouth daily. (Patient taking differently: Take 10 mg by mouth daily as needed for allergies.) 90 tablet 3   escitalopram (LEXAPRO) 10 MG tablet TAKE 1 TABLET DAILY (Patient taking differently: Take 10 mg by mouth at bedtime.) 90 tablet 3   fluticasone (FLONASE) 50 MCG/ACT nasal spray USE 2 SPRAYS IN [facility]  NOSTRIL DAILY 48 g 3   hydrochlorothiazide (HYDRODIURIL) 25 MG tablet TAKE 1 TABLET DAILY (Patient taking differently: Take 25 mg by mouth at bedtime.) 90 tablet 3   losartan (COZAAR) 100 MG tablet TAKE 1 TABLET DAILY (Patient taking differently: Take 100 mg by mouth every morning.) 90 tablet 3   Melatonin 10 MG TABS Take 10 mg by mouth at bedtime.      meloxicam (MOBIC) 15 MG tablet TAKE 1 TABLET DAILY 90 tablet 0   metFORMIN (GLUCOPHAGE) 500 MG tablet TAKE 2 TABLETS TWICE A DAY WITH MEALS (Patient taking differently: Take 1,000 mg by mouth 2 (two) times daily with a meal.) 360 tablet 3   Multiple  Vitamins-Minerals (ONE-A-DAY MENS HEALTH FORMULA PO) Take 1 tablet by mouth daily.      Omega-3 Krill Oil 500 MG CAPS Take 500 mg by mouth daily.      omeprazole (PRILOSEC) 40 MG capsule Take 1 capsule (40 mg total) by mouth daily. (Patient taking differently: Take 40 mg by mouth every morning.) 90 capsule 3   SAXENDA 18 MG/3ML SOPN INJECT 1/2 ML SUBCUTANEOUSLY ONCE A DAY ('3MG'$  TOTAL) (Patient not taking: Reported on 12/13/2021) 15 mL 0   No current facility-administered medications on file prior to visit.   Allergies  Allergen Reactions   Penicillins Anaphylaxis and Other (See Comments)    Has patient had a PCN reaction causing immediate rash, facial/tongue/throat swelling, SOB or lightheadedness with hypotension: Yes Has patient had a PCN reaction causing severe rash involving mucus membranes or skin necrosis: No Has patient had a PCN reaction that required hospitalization: Yes Has patient had a PCN reaction occurring within the last 10 years: No If all of the above answers are "NO", then may proceed with Cephalosporin use.    Sulfa Antibiotics Swelling and Other (See Comments)    Childhood allergy- site of swelling not recalled   Keflex [Cephalexin] Other (See Comments)    Childhood allergy- reaction not recalled   Social History   Socioeconomic History   Marital status: Married    Spouse name: Not on file   Number of children: 1   Years of education: college   Highest education level: Not on file  Occupational History   Not on file  Tobacco Use   Smoking status: Never   Smokeless tobacco: Never  Vaping Use   Vaping Use: Never used  Substance and Sexual Activity   Alcohol use: Not Currently   Drug use: No   Sexual activity: Yes  Other Topics Concern   Not on file  Social History Narrative   Drinks about 1 cup of tea a day    Social Determinants of Health   Financial Resource Strain: Not on file  Food Insecurity: Not on file  Transportation Needs: Not on file   Physical Activity: Not on file  Stress: Not on file  Social Connections: Not on file  Intimate Partner Violence: Not on file      Review of Systems  Musculoskeletal:  Positive for back pain.  All other systems reviewed and are negative.      Objective:   Physical Exam Vitals reviewed.  Constitutional:      Appearance: Normal appearance. He is obese.  Cardiovascular:     Rate and Rhythm: Normal rate and regular rhythm.     Heart sounds: Normal heart sounds.  Pulmonary:     Effort: Pulmonary effort is normal. No respiratory distress.     Breath sounds: Normal breath sounds. No  wheezing, rhonchi or rales.  Musculoskeletal:     Left foot: Normal range of motion. No swelling, deformity, tenderness, bony tenderness or crepitus.  Neurological:     Mental Status: He is alert.           Assessment & Plan:  Uncontrolled type 2 diabetes mellitus with hyperglycemia ([place]) We discussed treatment options including glipizide Actos GLP-1 agonists, Jardiance, Farxiga, and insulin.  After discussing his options, the patient elects to continue metformin and add Mounjaro but we will skip ahead to 7.5 mg subcu weekly given how well he did on Saxenda.  We will also add Farxiga 10 mg a day and then recheck fasting blood sugars and 2-hour postprandial sugars in 4 to 6 weeks or sooner if worsening

## 2021-12-18 ENCOUNTER — Encounter: Payer: Self-pay | Admitting: Family Medicine

## 2021-12-20 ENCOUNTER — Other Ambulatory Visit: Payer: Self-pay | Admitting: Family Medicine

## 2021-12-20 MED ORDER — BLOOD GLUCOSE MONITOR KIT: PACK | 0 refills | Status: DC

## 2021-12-20 NOTE — Telephone Encounter (Signed)
Prescription Request  12/20/2021  Is this a "Controlled Substance" medicine? No  LOV: 12/13/2021  What is the name of the medication or equipment? Accu chek guid monitor system  Have you contacted your pharmacy to request a refill? Yes   Which pharmacy would you like this sent to?  [place] [address] [place] [address] Phone: [phone] Fax: [phone]  [id number]: [phone]: [phone]   Patient notified that their request is being sent to the clinical staff for review and that they should receive a response within 2 business days.   Please advise at [phone]

## 2021-12-20 NOTE — Telephone Encounter (Signed)
Pharmacy verified another NT This is a resend. Was ordered back 12/13/21.  Requested Prescriptions  Pending Prescriptions Disp Refills   blood glucose meter kit and supplies KIT 1 each 0    Sig: Dispense based on patient and insurance preference. Use up to four times daily as directed.     Endocrinology: Diabetes - Testing Supplies Passed - 12/20/2021 11:26 AM      Passed - Valid encounter within last 12 months    Recent Outpatient Visits           8 months ago Insomnia, unspecified type   [place] Dennard Schaumann, Cammie Mcgee, MD   11 months ago Strain of lumbar region, initial encounter   [place] Dennard Schaumann, Cammie Mcgee, MD   1 year ago Iron deficiency anemia, unspecified iron deficiency anemia type   [place] [name], MD   1 year ago Controlled type 2 diabetes mellitus with complication, without long-term current use of insulin ([place])   [place] [name], MD   1 year ago Iron deficiency anemia, unspecified iron deficiency anemia type   [place] Pickard, Cammie Mcgee, MD

## 2021-12-21 ENCOUNTER — Other Ambulatory Visit: Payer: Self-pay | Admitting: *Deleted

## 2021-12-21 ENCOUNTER — Other Ambulatory Visit: Payer: Self-pay | Admitting: Family Medicine

## 2021-12-21 MED ORDER — BLOOD GLUCOSE MONITOR KIT
PACK | 0 refills | Status: AC
Start: 2021-12-21 — End: ?

## 2021-12-21 MED ORDER — TIRZEPATIDE 7.5 MG/0.5ML ~~LOC~~ SOAJ
7.5000 mg | SUBCUTANEOUS | 2 refills | Status: DC
Start: 2021-12-21 — End: 2021-12-27

## 2021-12-21 NOTE — Progress Notes (Unsigned)
  Media Information  Document Information  AMB Correspondence  REFILL  12/21/2021 08:52  Attached To:  Zachary Gillespie "Zachary Gillespie"  Source Information  Default, Provider, MD

## 2021-12-21 NOTE — Telephone Encounter (Signed)
Requested Prescriptions  Pending Prescriptions Disp Refills   blood glucose meter kit and supplies KIT 1 each 0    Sig: Dispense based on patient and insurance preference. Use up to four times daily as directed.     Endocrinology: Diabetes - Testing Supplies Passed - 12/21/2021  3:01 PM      Passed - Valid encounter within last 12 months    Recent Outpatient Visits           8 months ago Insomnia, unspecified type   [place] Dennard Schaumann, Cammie Mcgee, MD   11 months ago Strain of lumbar region, initial encounter   [place] Dennard Schaumann, Cammie Mcgee, MD   1 year ago Iron deficiency anemia, unspecified iron deficiency anemia type   [place] [name], MD   1 year ago Controlled type 2 diabetes mellitus with complication, without long-term current use of insulin ([place])   [place] [name], MD   1 year ago Iron deficiency anemia, unspecified iron deficiency anemia type   [place] Pickard, Cammie Mcgee, MD

## 2021-12-26 ENCOUNTER — Encounter: Payer: Self-pay | Admitting: Family Medicine

## 2021-12-27 ENCOUNTER — Other Ambulatory Visit: Payer: Self-pay | Admitting: Family Medicine

## 2021-12-27 MED ORDER — PROMETHAZINE HCL 25 MG PO TABS: 25.0000 mg | ORAL_TABLET | Freq: Three times a day (TID) | ORAL | 0 refills | Status: AC | PRN

## 2021-12-27 MED ORDER — TIRZEPATIDE 2.5 MG/0.5ML ~~LOC~~ SOAJ: 2.5000 mg | SUBCUTANEOUS | 1 refills | Status: DC

## 2022-01-03 ENCOUNTER — Other Ambulatory Visit: Payer: Self-pay

## 2022-01-03 DIAGNOSIS — E1165 Type 2 diabetes mellitus with hyperglycemia: Secondary | ICD-10-CM

## 2022-01-03 MED ORDER — TIRZEPATIDE 5 MG/0.5ML ~~LOC~~ SOAJ: 5.0000 mg | SUBCUTANEOUS | 3 refills | Status: DC

## 2022-01-06 ENCOUNTER — Encounter: Payer: Self-pay | Admitting: Family Medicine

## 2022-02-14 ENCOUNTER — Encounter: Payer: Self-pay | Admitting: Family Medicine

## 2022-03-05 ENCOUNTER — Encounter: Payer: Self-pay | Admitting: Family Medicine

## 2022-03-15 ENCOUNTER — Encounter: Payer: Self-pay | Admitting: Family Medicine

## 2022-04-09 ENCOUNTER — Encounter: Payer: Self-pay | Admitting: Family Medicine

## 2022-04-11 ENCOUNTER — Other Ambulatory Visit: Payer: Self-pay

## 2022-04-11 DIAGNOSIS — E118 Type 2 diabetes mellitus with unspecified complications: Secondary | ICD-10-CM

## 2022-04-11 DIAGNOSIS — E1165 Type 2 diabetes mellitus with hyperglycemia: Secondary | ICD-10-CM

## 2022-04-11 DIAGNOSIS — E785 Hyperlipidemia, unspecified: Secondary | ICD-10-CM

## 2022-04-11 DIAGNOSIS — I10 Essential (primary) hypertension: Secondary | ICD-10-CM

## 2022-04-11 MED ORDER — TIRZEPATIDE 12.5 MG/0.5ML ~~LOC~~ SOAJ: 12.5000 mg | SUBCUTANEOUS | 1 refills | Status: DC

## 2022-04-27 ENCOUNTER — Encounter: Payer: Self-pay | Admitting: Family Medicine

## 2022-04-29 ENCOUNTER — Other Ambulatory Visit: Payer: Self-pay

## 2022-04-29 DIAGNOSIS — E118 Type 2 diabetes mellitus with unspecified complications: Secondary | ICD-10-CM

## 2022-04-29 DIAGNOSIS — E785 Hyperlipidemia, unspecified: Secondary | ICD-10-CM

## 2022-04-29 DIAGNOSIS — I10 Essential (primary) hypertension: Secondary | ICD-10-CM

## 2022-04-29 MED ORDER — TIRZEPATIDE 12.5 MG/0.5ML ~~LOC~~ SOAJ
12.5000 mg | SUBCUTANEOUS | 1 refills | Status: DC
Start: 2022-04-29 — End: 2022-05-02

## 2022-05-02 ENCOUNTER — Other Ambulatory Visit: Payer: Self-pay | Admitting: Family Medicine

## 2022-05-02 MED ORDER — TIRZEPATIDE 15 MG/0.5ML ~~LOC~~ SOAJ
15.0000 mg | SUBCUTANEOUS | 11 refills | Status: DC
Start: 2022-05-02 — End: 2022-06-04

## 2022-05-06 ENCOUNTER — Encounter: Payer: Self-pay | Admitting: Family Medicine

## 2022-05-14 ENCOUNTER — Encounter: Payer: Self-pay | Admitting: Family Medicine

## 2022-06-01 ENCOUNTER — Encounter: Payer: Self-pay | Admitting: Family Medicine

## 2022-06-04 ENCOUNTER — Other Ambulatory Visit: Payer: Self-pay

## 2022-06-04 DIAGNOSIS — E785 Hyperlipidemia, unspecified: Secondary | ICD-10-CM

## 2022-06-04 DIAGNOSIS — E118 Type 2 diabetes mellitus with unspecified complications: Secondary | ICD-10-CM

## 2022-06-04 MED ORDER — TIRZEPATIDE 15 MG/0.5ML ~~LOC~~ SOPN
15.0000 mg | PEN_INJECTOR | SUBCUTANEOUS | 11 refills | Status: AC
Start: 2022-06-04 — End: ?

## 2022-06-12 ENCOUNTER — Encounter: Payer: Self-pay | Admitting: Family Medicine

## 2022-06-13 ENCOUNTER — Other Ambulatory Visit: Payer: Self-pay | Admitting: Family Medicine

## 2022-06-13 MED ORDER — NIRMATRELVIR/RITONAVIR (PAXLOVID)TABLET: 3.0000 | ORAL_TABLET | Freq: Two times a day (BID) | ORAL | 0 refills | Status: AC

## 2022-06-26 ENCOUNTER — Ambulatory Visit: Admitting: Urology

## 2022-06-27 ENCOUNTER — Other Ambulatory Visit: Payer: Self-pay

## 2022-06-27 ENCOUNTER — Other Ambulatory Visit: Payer: Self-pay | Admitting: Family Medicine

## 2022-06-27 DIAGNOSIS — E118 Type 2 diabetes mellitus with unspecified complications: Secondary | ICD-10-CM

## 2022-06-27 DIAGNOSIS — E785 Hyperlipidemia, unspecified: Secondary | ICD-10-CM

## 2022-06-27 DIAGNOSIS — I10 Essential (primary) hypertension: Secondary | ICD-10-CM

## 2022-06-27 MED ORDER — TIRZEPATIDE 15 MG/0.5ML ~~LOC~~ SOPN
15.0000 mg | PEN_INJECTOR | SUBCUTANEOUS | 1 refills | Status: AC
Start: 2022-06-27 — End: ?

## 2022-06-27 NOTE — Telephone Encounter (Signed)
D/C 05/02/22 by Dr. Tanya Nones. Requested Prescriptions  Refused Prescriptions Disp Refills   MOUNJARO 12.5 MG/0.5ML Pen [Pharmacy Med Name: MOUNJARO 12.5 MG/0.5 ML PEN]  1    Sig: INJECT 12.5 MG SUBCUTANEOUSLY ONE TIME PER WEEK     Off-Protocol Failed - 06/27/2022 10:14 AM      Failed - Medication not assigned to a protocol, review manually.      Failed - Valid encounter within last 12 months    Recent Outpatient Visits           1 year ago Insomnia, unspecified type   [facility] Pickard, Priscille Heidelberg, MD   1 year ago Strain of lumbar region, initial encounter   [facility] Tanya Nones, Priscille Heidelberg, MD   1 year ago Iron deficiency anemia, unspecified iron deficiency anemia type   [facility] Donita Brooks, MD   1 year ago Controlled type 2 diabetes mellitus with complication, without long-term current use of insulin (HCC)   [facility] Donita Brooks, MD   2 years ago Iron deficiency anemia, unspecified iron deficiency anemia type   [facility] Pickard, Priscille Heidelberg, MD

## 2022-08-27 ENCOUNTER — Encounter: Payer: Self-pay | Admitting: Family Medicine
